# Patient Record
Sex: Female | Born: 1935 | Race: White | Hispanic: No | Marital: Married | Smoking: Former smoker
Health system: Southern US, Community
[De-identification: ages and names within clinical notes are randomized; demographics above are authoritative.]

## PROBLEM LIST (undated history)

## (undated) DIAGNOSIS — I219 Acute myocardial infarction, unspecified: Secondary | ICD-10-CM

## (undated) DIAGNOSIS — I639 Cerebral infarction, unspecified: Secondary | ICD-10-CM

## (undated) DIAGNOSIS — S2239XA Fracture of one rib, unspecified side, initial encounter for closed fracture: Secondary | ICD-10-CM

## (undated) DIAGNOSIS — I480 Paroxysmal atrial fibrillation: Secondary | ICD-10-CM

## (undated) DIAGNOSIS — S2249XA Multiple fractures of ribs, unspecified side, initial encounter for closed fracture: Secondary | ICD-10-CM

## (undated) DIAGNOSIS — E7800 Pure hypercholesterolemia, unspecified: Secondary | ICD-10-CM

## (undated) DIAGNOSIS — G629 Polyneuropathy, unspecified: Secondary | ICD-10-CM

## (undated) DIAGNOSIS — Z5189 Encounter for other specified aftercare: Secondary | ICD-10-CM

## (undated) DIAGNOSIS — R112 Nausea with vomiting, unspecified: Secondary | ICD-10-CM

## (undated) DIAGNOSIS — R51 Headache: Secondary | ICD-10-CM

## (undated) DIAGNOSIS — I509 Heart failure, unspecified: Secondary | ICD-10-CM

## (undated) DIAGNOSIS — M1990 Unspecified osteoarthritis, unspecified site: Secondary | ICD-10-CM

## (undated) DIAGNOSIS — I10 Essential (primary) hypertension: Secondary | ICD-10-CM

## (undated) DIAGNOSIS — Z9889 Other specified postprocedural states: Secondary | ICD-10-CM

## (undated) DIAGNOSIS — L500 Allergic urticaria: Secondary | ICD-10-CM

## (undated) DIAGNOSIS — H269 Unspecified cataract: Secondary | ICD-10-CM

## (undated) DIAGNOSIS — D649 Anemia, unspecified: Secondary | ICD-10-CM

## (undated) DIAGNOSIS — G459 Transient cerebral ischemic attack, unspecified: Secondary | ICD-10-CM

## (undated) DIAGNOSIS — B029 Zoster without complications: Secondary | ICD-10-CM

## (undated) HISTORY — PX: OTHER SURGICAL HISTORY: SHX169

## (undated) HISTORY — DX: Anemia, unspecified: D64.9

## (undated) HISTORY — DX: Heart failure, unspecified: I50.9

## (undated) HISTORY — DX: Transient cerebral ischemic attack, unspecified: G45.9

## (undated) HISTORY — DX: Acute myocardial infarction, unspecified: I21.9

## (undated) HISTORY — PX: EYE SURGERY: SHX253

## (undated) HISTORY — DX: Unspecified cataract: H26.9

## (undated) HISTORY — PX: DILATION AND CURETTAGE OF UTERUS: SHX78

## (undated) HISTORY — DX: Cerebral infarction, unspecified: I63.9

## (undated) HISTORY — DX: Encounter for other specified aftercare: Z51.89

## (undated) HISTORY — DX: Paroxysmal atrial fibrillation: I48.0

---
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
## 1997-12-21 ENCOUNTER — Other Ambulatory Visit: Admission: RE | Admit: 1997-12-21 | Discharge: 1997-12-21 | Payer: Self-pay | Admitting: Internal Medicine

## 1998-12-23 ENCOUNTER — Other Ambulatory Visit: Admission: RE | Admit: 1998-12-23 | Discharge: 1998-12-23 | Payer: Self-pay | Admitting: Internal Medicine

## 2000-04-30 ENCOUNTER — Encounter: Payer: Self-pay | Admitting: Emergency Medicine

## 2000-04-30 ENCOUNTER — Emergency Department (HOSPITAL_COMMUNITY): Admission: EM | Admit: 2000-04-30 | Discharge: 2000-04-30 | Payer: Self-pay | Admitting: Emergency Medicine

## 2009-02-09 HISTORY — PX: JOINT REPLACEMENT: SHX530

## 2009-07-03 ENCOUNTER — Inpatient Hospital Stay (HOSPITAL_COMMUNITY): Admission: RE | Admit: 2009-07-03 | Discharge: 2009-07-07 | Payer: Self-pay | Admitting: Orthopedic Surgery

## 2010-04-02 ENCOUNTER — Other Ambulatory Visit (HOSPITAL_COMMUNITY): Payer: Self-pay | Admitting: Orthopedic Surgery

## 2010-04-02 DIAGNOSIS — M25551 Pain in right hip: Secondary | ICD-10-CM

## 2010-04-11 ENCOUNTER — Ambulatory Visit (HOSPITAL_COMMUNITY)
Admission: RE | Admit: 2010-04-11 | Discharge: 2010-04-11 | Disposition: A | Discharge status: Home / Self Care | Payer: Medicare Other | Source: Ambulatory Visit | Attending: Orthopedic Surgery | Admitting: Orthopedic Surgery

## 2010-04-11 ENCOUNTER — Encounter (HOSPITAL_COMMUNITY)
Admission: RE | Admit: 2010-04-11 | Discharge: 2010-04-11 | Disposition: A | Discharge status: Home / Self Care | Payer: Medicare Other | Source: Ambulatory Visit | Attending: Orthopedic Surgery | Admitting: Orthopedic Surgery

## 2010-04-11 DIAGNOSIS — Z96649 Presence of unspecified artificial hip joint: Secondary | ICD-10-CM | POA: Insufficient documentation

## 2010-04-11 DIAGNOSIS — M25559 Pain in unspecified hip: Secondary | ICD-10-CM | POA: Insufficient documentation

## 2010-04-11 DIAGNOSIS — M25551 Pain in right hip: Secondary | ICD-10-CM

## 2010-04-11 MED ORDER — TECHNETIUM TC 99M MEDRONATE IV KIT
25.0000 | PACK | Freq: Once | INTRAVENOUS | Status: AC | PRN
  Administered 2010-04-11: 23.9 via INTRAVENOUS

## 2010-04-23 ENCOUNTER — Ambulatory Visit: Payer: Medicare Other | Attending: Orthopedic Surgery | Admitting: Physical Therapy

## 2010-04-23 DIAGNOSIS — M25669 Stiffness of unspecified knee, not elsewhere classified: Secondary | ICD-10-CM | POA: Insufficient documentation

## 2010-04-23 DIAGNOSIS — M25559 Pain in unspecified hip: Secondary | ICD-10-CM | POA: Insufficient documentation

## 2010-04-23 DIAGNOSIS — Z96649 Presence of unspecified artificial hip joint: Secondary | ICD-10-CM | POA: Insufficient documentation

## 2010-04-23 DIAGNOSIS — IMO0001 Reserved for inherently not codable concepts without codable children: Secondary | ICD-10-CM | POA: Insufficient documentation

## 2010-04-23 DIAGNOSIS — R262 Difficulty in walking, not elsewhere classified: Secondary | ICD-10-CM | POA: Insufficient documentation

## 2010-04-28 LAB — ABO/RH: ABO/RH(D): A POS

## 2010-04-28 LAB — PROTIME-INR
INR: 1.06 (ref 0.00–1.49)
INR: 1.07 (ref 0.00–1.49)
INR: 1.25 (ref 0.00–1.49)
INR: 1.3 (ref 0.00–1.49)
Prothrombin Time: 13.8 seconds (ref 11.6–15.2)
Prothrombin Time: 15.1 seconds (ref 11.6–15.2)
Prothrombin Time: 15.6 seconds — ABNORMAL HIGH (ref 11.6–15.2)
Prothrombin Time: 16.1 seconds — ABNORMAL HIGH (ref 11.6–15.2)

## 2010-04-28 LAB — TYPE AND SCREEN
ABO/RH(D): A POS
Antibody Screen: NEGATIVE

## 2010-04-28 LAB — BASIC METABOLIC PANEL
BUN: 11 mg/dL (ref 6–23)
BUN: 11 mg/dL (ref 6–23)
Calcium: 8.4 mg/dL (ref 8.4–10.5)
Calcium: 8.4 mg/dL (ref 8.4–10.5)
Creatinine, Ser: 0.84 mg/dL (ref 0.4–1.2)
Creatinine, Ser: 0.91 mg/dL (ref 0.4–1.2)
GFR calc Af Amer: >60 mL/min (ref >60)
GFR calc non Af Amer: 59 mL/min — ABNORMAL LOW (ref >60)
GFR calc non Af Amer: >60 mL/min (ref >60)
Glucose, Bld: 216 mg/dL — ABNORMAL HIGH (ref 70–99)
Sodium: 139 mEq/L (ref 135–145)

## 2010-04-28 LAB — CBC
Hemoglobin: 10.6 g/dL — ABNORMAL LOW (ref 12.0–15.0)
Hemoglobin: 9.2 g/dL — ABNORMAL LOW (ref 12.0–15.0)
Platelets: 136 10*3/uL — ABNORMAL LOW (ref 150–400)
Platelets: 144 10*3/uL — ABNORMAL LOW (ref 150–400)
Platelets: 179 10*3/uL (ref 150–400)
RBC: 2.78 MIL/uL — ABNORMAL LOW (ref 3.87–5.11)
RBC: 2.93 MIL/uL — ABNORMAL LOW (ref 3.87–5.11)
RDW: 13.5 % (ref 11.5–15.5)
RDW: 13.9 % (ref 11.5–15.5)
WBC: 7.5 10*3/uL (ref 4.0–10.5)

## 2010-04-28 LAB — COMPREHENSIVE METABOLIC PANEL
Albumin: 3.9 g/dL (ref 3.5–5.2)
Alkaline Phosphatase: 78 U/L (ref 39–117)
BUN: 18 mg/dL (ref 6–23)
Potassium: 4 mEq/L (ref 3.5–5.1)
Sodium: 138 mEq/L (ref 135–145)
Total Protein: 6.1 g/dL (ref 6.0–8.3)

## 2010-04-28 LAB — URINALYSIS, ROUTINE W REFLEX MICROSCOPIC
Ketones, ur: NEGATIVE mg/dL
Nitrite: NEGATIVE
Protein, ur: NEGATIVE mg/dL

## 2010-04-30 ENCOUNTER — Ambulatory Visit: Payer: Medicare Other | Admitting: Physical Therapy

## 2010-05-02 ENCOUNTER — Ambulatory Visit: Payer: Medicare Other | Admitting: Physical Therapy

## 2010-05-05 ENCOUNTER — Ambulatory Visit: Payer: Medicare Other | Admitting: Physical Therapy

## 2010-05-07 ENCOUNTER — Ambulatory Visit: Payer: Medicare Other | Admitting: Physical Therapy

## 2010-05-12 ENCOUNTER — Ambulatory Visit: Payer: Medicare Other | Attending: Orthopedic Surgery | Admitting: Physical Therapy

## 2010-05-12 DIAGNOSIS — M25669 Stiffness of unspecified knee, not elsewhere classified: Secondary | ICD-10-CM | POA: Insufficient documentation

## 2010-05-12 DIAGNOSIS — M25559 Pain in unspecified hip: Secondary | ICD-10-CM | POA: Insufficient documentation

## 2010-05-12 DIAGNOSIS — Z96649 Presence of unspecified artificial hip joint: Secondary | ICD-10-CM | POA: Insufficient documentation

## 2010-05-12 DIAGNOSIS — R262 Difficulty in walking, not elsewhere classified: Secondary | ICD-10-CM | POA: Insufficient documentation

## 2010-05-12 DIAGNOSIS — IMO0001 Reserved for inherently not codable concepts without codable children: Secondary | ICD-10-CM | POA: Insufficient documentation

## 2010-05-14 ENCOUNTER — Ambulatory Visit: Payer: Medicare Other | Admitting: Physical Therapy

## 2010-05-21 ENCOUNTER — Ambulatory Visit: Payer: Medicare Other | Admitting: Physical Therapy

## 2010-05-26 ENCOUNTER — Ambulatory Visit: Payer: Medicare Other | Admitting: Physical Therapy

## 2010-05-28 ENCOUNTER — Ambulatory Visit: Payer: Medicare Other | Admitting: Physical Therapy

## 2010-06-02 ENCOUNTER — Ambulatory Visit: Payer: Medicare Other | Admitting: Physical Therapy

## 2010-06-04 ENCOUNTER — Ambulatory Visit: Payer: Medicare Other | Admitting: Physical Therapy

## 2010-06-09 ENCOUNTER — Ambulatory Visit: Payer: Medicare Other | Admitting: Physical Therapy

## 2010-06-11 ENCOUNTER — Ambulatory Visit: Payer: Medicare Other | Attending: Orthopedic Surgery | Admitting: Physical Therapy

## 2010-06-11 DIAGNOSIS — M25669 Stiffness of unspecified knee, not elsewhere classified: Secondary | ICD-10-CM | POA: Insufficient documentation

## 2010-06-11 DIAGNOSIS — R262 Difficulty in walking, not elsewhere classified: Secondary | ICD-10-CM | POA: Insufficient documentation

## 2010-06-11 DIAGNOSIS — IMO0001 Reserved for inherently not codable concepts without codable children: Secondary | ICD-10-CM | POA: Insufficient documentation

## 2010-06-11 DIAGNOSIS — Z96649 Presence of unspecified artificial hip joint: Secondary | ICD-10-CM | POA: Insufficient documentation

## 2010-06-11 DIAGNOSIS — M25559 Pain in unspecified hip: Secondary | ICD-10-CM | POA: Insufficient documentation

## 2010-06-23 ENCOUNTER — Ambulatory Visit: Payer: Medicare Other | Admitting: Physical Therapy

## 2010-06-25 ENCOUNTER — Encounter: Payer: Medicare Other | Admitting: Physical Therapy

## 2010-06-25 ENCOUNTER — Ambulatory Visit: Payer: Medicare Other | Admitting: Physical Therapy

## 2010-06-26 ENCOUNTER — Ambulatory Visit: Payer: Medicare Other | Admitting: Physical Therapy

## 2010-06-30 ENCOUNTER — Ambulatory Visit: Payer: Medicare Other | Admitting: Physical Therapy

## 2010-07-01 ENCOUNTER — Encounter: Payer: Medicare Other | Admitting: Physical Therapy

## 2010-07-02 ENCOUNTER — Ambulatory Visit: Payer: Medicare Other | Admitting: Physical Therapy

## 2010-07-03 ENCOUNTER — Encounter: Payer: Medicare Other | Admitting: Physical Therapy

## 2010-07-04 ENCOUNTER — Encounter: Payer: Medicare Other | Admitting: Physical Therapy

## 2011-02-25 ENCOUNTER — Other Ambulatory Visit: Payer: Self-pay | Admitting: Orthopedic Surgery

## 2011-05-13 ENCOUNTER — Encounter (HOSPITAL_COMMUNITY): Payer: Self-pay | Admitting: Pharmacy Technician

## 2011-05-14 ENCOUNTER — Encounter (HOSPITAL_COMMUNITY): Payer: Self-pay

## 2011-05-14 ENCOUNTER — Ambulatory Visit (HOSPITAL_COMMUNITY)
Admission: RE | Admit: 2011-05-14 | Discharge: 2011-05-14 | Disposition: A | Discharge status: Home / Self Care | Payer: Medicare Other | Source: Ambulatory Visit | Attending: Orthopedic Surgery | Admitting: Orthopedic Surgery

## 2011-05-14 ENCOUNTER — Encounter (HOSPITAL_COMMUNITY)
Admission: RE | Admit: 2011-05-14 | Discharge: 2011-05-14 | Disposition: A | Discharge status: Home / Self Care | Payer: Medicare Other | Source: Ambulatory Visit | Attending: Orthopedic Surgery | Admitting: Orthopedic Surgery

## 2011-05-14 DIAGNOSIS — Z01818 Encounter for other preprocedural examination: Secondary | ICD-10-CM | POA: Insufficient documentation

## 2011-05-14 DIAGNOSIS — Z01812 Encounter for preprocedural laboratory examination: Secondary | ICD-10-CM | POA: Insufficient documentation

## 2011-05-14 DIAGNOSIS — I10 Essential (primary) hypertension: Secondary | ICD-10-CM | POA: Insufficient documentation

## 2011-05-14 DIAGNOSIS — M1610 Unilateral primary osteoarthritis, unspecified hip: Secondary | ICD-10-CM | POA: Insufficient documentation

## 2011-05-14 DIAGNOSIS — M169 Osteoarthritis of hip, unspecified: Secondary | ICD-10-CM | POA: Insufficient documentation

## 2011-05-14 HISTORY — DX: Unspecified osteoarthritis, unspecified site: M19.90

## 2011-05-14 HISTORY — DX: Nausea with vomiting, unspecified: R11.2

## 2011-05-14 HISTORY — DX: Polyneuropathy, unspecified: G62.9

## 2011-05-14 HISTORY — DX: Headache: R51

## 2011-05-14 HISTORY — DX: Allergic urticaria: L50.0

## 2011-05-14 HISTORY — DX: Essential (primary) hypertension: I10

## 2011-05-14 HISTORY — DX: Zoster without complications: B02.9

## 2011-05-14 HISTORY — DX: Fracture of one rib, unspecified side, initial encounter for closed fracture: S22.39XA

## 2011-05-14 HISTORY — DX: Other specified postprocedural states: Z98.890

## 2011-05-14 HISTORY — DX: Pure hypercholesterolemia, unspecified: E78.00

## 2011-05-14 HISTORY — DX: Multiple fractures of ribs, unspecified side, initial encounter for closed fracture: S22.49XA

## 2011-05-14 LAB — URINE MICROSCOPIC-ADD ON

## 2011-05-14 LAB — CBC
HCT: 39.2 % (ref 36.0–46.0)
Hemoglobin: 12.9 g/dL (ref 12.0–15.0)
MCH: 30.1 pg (ref 26.0–34.0)
MCHC: 32.9 g/dL (ref 30.0–36.0)
MCV: 91.6 fL (ref 78.0–100.0)
RBC: 4.28 MIL/uL (ref 3.87–5.11)

## 2011-05-14 LAB — COMPREHENSIVE METABOLIC PANEL
BUN: 16 mg/dL (ref 6–23)
CO2: 29 mEq/L (ref 19–32)
Calcium: 9 mg/dL (ref 8.4–10.5)
GFR calc Af Amer: 87 mL/min — ABNORMAL LOW (ref >90)
GFR calc non Af Amer: 75 mL/min — ABNORMAL LOW (ref >90)
Glucose, Bld: 102 mg/dL — ABNORMAL HIGH (ref 70–99)
Total Protein: 7 g/dL (ref 6.0–8.3)

## 2011-05-14 LAB — URINALYSIS, ROUTINE W REFLEX MICROSCOPIC
Ketones, ur: NEGATIVE mg/dL
Protein, ur: NEGATIVE mg/dL
Urobilinogen, UA: 0.2 mg/dL (ref 0.0–1.0)

## 2011-05-14 LAB — PROTIME-INR: Prothrombin Time: 13.6 seconds (ref 11.6–15.2)

## 2011-05-14 LAB — SURGICAL PCR SCREEN: MRSA, PCR: NEGATIVE

## 2011-05-14 NOTE — Patient Instructions (Signed)
YOUR SURGERY IS SCHEDULED ON:  April  17  AT 10:45 AM  REPORT TO [facility] AT:  8:15 AM      PHONE # FOR SHORT STAY IS [phone]  DO NOT EAT OR DRINK ANYTHING AFTER MIDNIGHT THE NIGHT BEFORE YOUR SURGERY.  YOU MAY BRUSH YOUR TEETH, RINSE OUT YOUR MOUTH--BUT NO WATER, NO FOOD, NO CHEWING GUM, NO MINTS, NO CANDIES, NO CHEWING TOBACCO.  PLEASE TAKE THE FOLLOWING MEDICATIONS THE AM OF YOUR SURGERY WITH A FEW SIPS OF WATER:  METOPROLOL    IF YOU USE INHALERS--USE YOUR INHALERS THE AM OF YOUR SURGERY AND BRING INHALERS TO THE HOSPITAL -TAKE TO SURGERY.    IF YOU ARE DIABETIC:  DO NOT TAKE ANY DIABETIC MEDICATIONS THE AM OF YOUR SURGERY.  IF YOU TAKE INSULIN IN THE EVENINGS--PLEASE ONLY TAKE 1/2 NORMAL EVENING DOSE THE NIGHT BEFORE YOUR SURGERY.  NO INSULIN THE AM OF YOUR SURGERY.  IF YOU HAVE SLEEP APNEA AND USE CPAP OR BIPAP--PLEASE BRING THE MASK --NOT THE MACHINE-NOT THE TUBING   -JUST THE MASK. DO NOT BRING VALUABLES, MONEY, CREDIT CARDS.  CONTACT LENS, DENTURES / PARTIALS, GLASSES SHOULD NOT BE WORN TO SURGERY AND IN MOST CASES-HEARING AIDS WILL NEED TO BE REMOVED.  BRING YOUR GLASSES CASE, ANY EQUIPMENT NEEDED FOR YOUR CONTACT LENS. FOR PATIENTS ADMITTED TO THE HOSPITAL--CHECK OUT TIME THE DAY OF DISCHARGE IS 11:00 AM.  ALL INPATIENT ROOMS ARE PRIVATE - WITH BATHROOM, TELEPHONE, TELEVISION AND WIFI INTERNET. IF YOU ARE BEING DISCHARGED THE SAME DAY OF YOUR SURGERY--YOU CAN NOT DRIVE YOURSELF HOME--AND SHOULD NOT GO HOME ALONE BY TAXI OR BUS.  NO DRIVING OR OPERATING MACHINERY FOR 24 HOURS FOLLOWING ANESTHESIA / PAIN MEDICATIONS.                            SPECIAL INSTRUCTIONS:  CHLORHEXIDINE SOAP SHOWER (other brand names are Betasept and Hibiclens ) PLEASE SHOWER WITH CHLORHEXIDINE THE NIGHT BEFORE YOUR SURGERY AND THE AM OF YOUR SURGERY. DO NOT USE CHLORHEXIDINE ON YOUR FACE OR PRIVATE AREAS--YOU MAY USE YOUR NORMAL SOAP THOSE AREAS AND YOUR NORMAL SHAMPOO.  WOMEN  SHOULD AVOID SHAVING UNDER ARMS AND SHAVING LEGS 48 HOURS BEFORE USING CHLORHEXIDINE TO AVOID SKIN IRRITATION.  DO NOT USE IF ALLERGIC TO CHLORHEXIDINE.  PLEASE READ OVER ANY  FACT SHEETS THAT YOU WERE GIVEN: MRSA INFORMATION, BLOOD TRANSFUSION INFORMATION, INCENTIVE SPIROMETER INFORMATION.

## 2011-05-14 NOTE — Pre-Procedure Instructions (Signed)
PT'S EKG REPORT 03/09/2011 ON PT'S CHART FROM DR. PANG ALONG WITH NOTE OF MEDICAL CLEARANCE AND COPY OF PT'S NUCLEAR STRESS TEST REPORT-NORMAL DONE ON 03/13/2011 BY DR. GANJI. CXR, LEFT HIP XRAY, CBC, CMET, PT, PTT, UA WERE DONE TODAY PREOP--T/S WILL BE DRAWN ON ARRIVAL FOR SURGERY.

## 2011-05-26 ENCOUNTER — Other Ambulatory Visit: Payer: Self-pay | Admitting: Orthopedic Surgery

## 2011-05-26 NOTE — H&P (Signed)
Maria [name]  DOB: 05/07/1922 Married / Language: English / Race: White Female  Date of Admission:  05/27/2011  Chief Complaint:  Left Hip Pain  History of Present Illness The patient is a 78 year old female who comes in today for a preoperative History and Physical. The patient is scheduled for a left total hip arthroplasty to be performed by Dr. Frank V. Aluisio, MD at [facility] on 05/27/2011. he patient is a 78 year old female who presents today for follow up of their hip. The patient is being followed for their left osteoarthritis. Symptoms reported today include: stiffness. The patient feels that they are doing well. The following medication has been used for pain control: none. The patient presents today following 3 weeks post intra-articular hip injection. She states that she still has some pain since the injection but the pain is much more manageable. She is hopeful that the injection will continue to give her pain relief until she has her hip replaced in April. The patient is being followed for their right knee pain. from injury. Symptoms reported today include: pain, giving way, instability and difficulty ambulating. The patient feels that they are doing well and report their pain level to be moderate to severe. The following medication has been used for pain control: none. The patient presents today following MRI. The patient has reported improvement of their symptoms with: bracing. The patient indicates that they have questions or concerns today regarding pain, their progress at this point and work duties. They have been treated conservatively in the past for the above stated problem and despite conservative measures, they continue to have progressive pain and severe functional limitations and dysfunction. They have failed non-operative management. It is felt that they would benefit from undergoing total joint replacement. Risks and benefits of the procedure have  been discussed with the patient and they elect to proceed with surgery. There are no active contraindications to surgery such as ongoing infection or rapidly progressive neurological disease.  Allergies Codeine/Codeine Derivatives. Nausea, Vomiting. Tylenol *ANALGESICS - NonNarcotic*. Itching. Advil *ANALGESICS - ANTI-INFLAMMATORY*. Itching.  Medication History(DREW L Letitia Sabala, PA-C; 05/07/2011 1:46 PM) Metoprolol Tartrate (25MG Tablet, 1/2 tab Oral daily) Active. Furosemide (20MG Tablet, Oral as needed) Active. B12-Active (1MG Tablet Chewable, Oral) Active. (Injection every 3 months.) Lotemax (0.5% Ointment, Ophthalmic) Active.  Problem List/Past Medical Fracture, ribs (807.00) Peripheral Neuropathy Osteoporosis Osteoarthritis Migraine Headache High blood pressure Allergic Urticaria Shingles Glaucoma Cataract Hypercholesterolemia Measles Mumps  Past Surgical History Total Hip Replacement. Date: 2011. right Tonsillectomy. Date: 1934. Hysterectomy. Date: 1958. complete (non-cancerous) Dilation and Curettage of Uterus - Multiple Cataract Surgery. bilateral; 1998 & 2006 Appendectomy. Date: 1937. External Fixator Left Arm Fracture. Date: 1997.  Family History Chronic Obstructive Lung Disease. mother Heart Disease. mother, sister and brother Hypertension. mother Osteoarthritis. mother Father. Deceased. age 39, Motorcycle accident Mother. Deceased. age 71  Social History Alcohol use. never consumed alcohol Children. 3 Current work status. retired Exercise. Exercises rarely; does running / walking and other Illicit drug use. no Living situation. live with spouse Marital status. married Number of flights of stairs before winded. 2-3 Pain Contract. no Tobacco / smoke exposure. no Tobacco use. former smoker; smoke(d) 1 pack(s) per day Post-Surgical Plans. Plans to go home.  Review of Systems General:Not Present- Chills, Fever, Night  Sweats, Fatigue, Weight Gain, Weight Loss and Memory Loss. Skin:Present- Itching. Not Present- Hives, Rash, Eczema and Lesions. HEENT:Present- Dentures. Not Present- Tinnitus, Headache, Double Vision, Visual Loss and Hearing Loss. Respiratory:Not   Present- Shortness of breath with exertion, Shortness of breath at rest, Allergies, Coughing up blood and Chronic Cough. Cardiovascular:Not Present- Chest Pain, Racing/skipping heartbeats, Difficulty Breathing Lying Down, Murmur, Swelling and Palpitations. Gastrointestinal:Not Present- Bloody Stool, Heartburn, Abdominal Pain, Vomiting, Nausea, Constipation, Diarrhea, Difficulty Swallowing, Jaundice and Loss of appetitie. Female Genitourinary:Present- Urinating at Night. Not Present- Blood in Urine, Urinary frequency, Weak urinary stream, Discharge, Flank Pain, Incontinence, Painful Urination, Urgency and Urinary Retention. Musculoskeletal:Not Present- Muscle Weakness, Muscle Pain, Joint Swelling, Joint Pain, Back Pain, Morning Stiffness and Spasms. Neurological:Not Present- Tremor, Dizziness, Blackout spells, Paralysis, Difficulty with balance and Weakness. Psychiatric:Not Present- Insomnia.  Vitals Weight: 170 lb Height: 64 in Body Surface Area: 1.87 m Body Mass Index: 29.18 kg/m Pulse: 64 (Regular) Resp.: 20 (Unlabored) BP: 158/68 (Sitting, Right Arm, Standard)  Physical Exam The physical exam findings are as follows:  General Mental Status - Alert, cooperative and good historian. General Appearance- pleasant. Not in acute distress. Orientation- Oriented X3. Build & Nutrition- Well nourished and Well developed.   Note: Patient is an 78 year old female with continued hip pain. Patient is accompanied today by her husband.  Head and Neck Head- normocephalic, atraumatic . Neck Global Assessment- supple. no bruit auscultated on the right and no bruit auscultated on the left.   Eye Pupil- Bilateral- Regular and  Round. Motion- Bilateral- EOMI. wears glasses  ENMT  upper and lower denture plates  Chest and Lung Exam Auscultation: Breath sounds:- clear at anterior chest wall and - clear at posterior chest wall. Adventitious sounds:- No Adventitious sounds.   Cardiovascular Auscultation:Rhythm- Regular rate and rhythm. Heart Sounds- S1 WNL and S2 WNL. Murmurs & Other Heart Sounds:Auscultation of the heart reveals - No Murmurs.   Abdomen Palpation/Percussion:Tenderness- Abdomen is non-tender to palpation. Rigidity (guarding)- Abdomen is soft. Auscultation:Auscultation of the abdomen reveals - Bowel sounds normal.   Female Genitourinary Not done, not pertinent to present illness  Musculoskeletal  On exam, well-developed female, alert and oriented, in no apparent distress. The left foot can be flexed about 90. No internal rotation. About 20 external rotation, 20 abduction.   RADIOGRAPHS: Complete bone on bone in the hip with osteophyte formation and subchondral cyst.  Assessment & Plan Osteoarthritis Left Hip  Patient is for a Left Total HIp Replacement by Dr. Aluisio.  Plan is to go home.  Patient has had nausea and vomiting with anesthesia in the past.  PCP - Dr. Richard Pang  Drew Naidelyn Parrella, PA-C  

## 2011-05-27 ENCOUNTER — Ambulatory Visit (HOSPITAL_COMMUNITY): Payer: Medicare Other

## 2011-05-27 ENCOUNTER — Encounter (HOSPITAL_COMMUNITY): Payer: Self-pay | Admitting: Anesthesiology

## 2011-05-27 ENCOUNTER — Inpatient Hospital Stay (HOSPITAL_COMMUNITY)
Admission: RE | Admit: 2011-05-27 | Discharge: 2011-05-31 | DRG: 470 | Disposition: A | Discharge status: Home Health | Payer: Medicare Other | Source: Ambulatory Visit | Attending: Orthopedic Surgery | Admitting: Orthopedic Surgery

## 2011-05-27 ENCOUNTER — Encounter (HOSPITAL_COMMUNITY): Payer: Self-pay | Admitting: *Deleted

## 2011-05-27 ENCOUNTER — Ambulatory Visit (HOSPITAL_COMMUNITY): Payer: Medicare Other | Admitting: Anesthesiology

## 2011-05-27 ENCOUNTER — Encounter (HOSPITAL_COMMUNITY)
Admission: RE | Disposition: A | Discharge status: Home Health | Payer: Self-pay | Source: Ambulatory Visit | Attending: Orthopedic Surgery

## 2011-05-27 DIAGNOSIS — Z96649 Presence of unspecified artificial hip joint: Secondary | ICD-10-CM

## 2011-05-27 DIAGNOSIS — M1610 Unilateral primary osteoarthritis, unspecified hip: Principal | ICD-10-CM | POA: Diagnosis present

## 2011-05-27 DIAGNOSIS — M169 Osteoarthritis of hip, unspecified: Secondary | ICD-10-CM | POA: Diagnosis present

## 2011-05-27 DIAGNOSIS — D62 Acute posthemorrhagic anemia: Secondary | ICD-10-CM | POA: Diagnosis not present

## 2011-05-27 DIAGNOSIS — I10 Essential (primary) hypertension: Secondary | ICD-10-CM | POA: Diagnosis present

## 2011-05-27 DIAGNOSIS — M810 Age-related osteoporosis without current pathological fracture: Secondary | ICD-10-CM | POA: Diagnosis present

## 2011-05-27 HISTORY — PX: TOTAL HIP ARTHROPLASTY: SHX124

## 2011-05-27 LAB — TYPE AND SCREEN: ABO/RH(D): A POS

## 2011-05-27 SURGERY — ARTHROPLASTY, HIP, TOTAL,POSTERIOR APPROACH
Anesthesia: General | Site: Hip | Laterality: Left | Wound class: Clean

## 2011-05-27 MED ORDER — BUPIVACAINE LIPOSOME 1.3 % IJ SUSP
20.0000 mL | Freq: Once | INTRAMUSCULAR | Status: DC
  Filled 2011-05-27: qty 20

## 2011-05-27 MED ORDER — CEFAZOLIN SODIUM 1-5 GM-% IV SOLN
INTRAVENOUS | Status: AC
  Filled 2011-05-27: qty 50

## 2011-05-27 MED ORDER — LACTATED RINGERS IV SOLN
INTRAVENOUS | Status: DC
Start: 2011-05-27 — End: 2011-05-27
  Administered 2011-05-27: 1000 mL via INTRAVENOUS

## 2011-05-27 MED ORDER — FENTANYL CITRATE 0.05 MG/ML IJ SOLN
INTRAMUSCULAR | Status: DC | PRN
  Administered 2011-05-27 (×2): 50 ug via INTRAVENOUS
  Administered 2011-05-27: 100 ug via INTRAVENOUS
  Administered 2011-05-27: 50 ug via INTRAVENOUS

## 2011-05-27 MED ORDER — POTASSIUM CHLORIDE IN NACL 20-0.9 MEQ/L-% IV SOLN
INTRAVENOUS | Status: DC
  Administered 2011-05-27 – 2011-05-28 (×2): via INTRAVENOUS
  Filled 2011-05-27 (×3): qty 1000

## 2011-05-27 MED ORDER — EPHEDRINE SULFATE 50 MG/ML IJ SOLN
INTRAMUSCULAR | Status: DC | PRN
  Administered 2011-05-27: 10 mg via INTRAVENOUS

## 2011-05-27 MED ORDER — TEMAZEPAM 15 MG PO CAPS: 15.0000 mg | ORAL_CAPSULE | Freq: Every evening | ORAL | Status: DC | PRN

## 2011-05-27 MED ORDER — HYDROMORPHONE HCL PF 1 MG/ML IJ SOLN
INTRAMUSCULAR | Status: AC
  Filled 2011-05-27: qty 1

## 2011-05-27 MED ORDER — 0.9 % SODIUM CHLORIDE (POUR BTL) OPTIME
TOPICAL | Status: DC | PRN
  Administered 2011-05-27: 1000 mL

## 2011-05-27 MED ORDER — PROPOFOL 10 MG/ML IV BOLUS
INTRAVENOUS | Status: DC | PRN
  Administered 2011-05-27: 150 mg via INTRAVENOUS

## 2011-05-27 MED ORDER — LIDOCAINE HCL (CARDIAC) 20 MG/ML IV SOLN
INTRAVENOUS | Status: DC | PRN
  Administered 2011-05-27: 50 mg via INTRAVENOUS

## 2011-05-27 MED ORDER — HYDROMORPHONE HCL 2 MG PO TABS
2.0000 mg | ORAL_TABLET | ORAL | Status: DC | PRN
  Administered 2011-05-27 – 2011-05-28 (×2): 2 mg via ORAL
  Administered 2011-05-28: 4 mg via ORAL
  Administered 2011-05-28: 2 mg via ORAL
  Administered 2011-05-28: 4 mg via ORAL
  Administered 2011-05-29 (×2): 2 mg via ORAL
  Filled 2011-05-27 (×2): qty 1
  Filled 2011-05-27: qty 2
  Filled 2011-05-27 (×2): qty 1
  Filled 2011-05-27: qty 2
  Filled 2011-05-27: qty 1

## 2011-05-27 MED ORDER — MENTHOL 3 MG MT LOZG: 1.0000 | LOZENGE | OROMUCOSAL | Status: DC | PRN

## 2011-05-27 MED ORDER — RIVAROXABAN 10 MG PO TABS
10.0000 mg | ORAL_TABLET | Freq: Every day | ORAL | Status: DC
  Administered 2011-05-28 – 2011-05-31 (×4): 10 mg via ORAL
  Filled 2011-05-27 (×4): qty 1

## 2011-05-27 MED ORDER — FLEET ENEMA 7-19 GM/118ML RE ENEM: 1.0000 | ENEMA | Freq: Once | RECTAL | Status: AC | PRN

## 2011-05-27 MED ORDER — PHENOL 1.4 % MT LIQD: 1.0000 | OROMUCOSAL | Status: DC | PRN

## 2011-05-27 MED ORDER — METHOCARBAMOL 500 MG PO TABS
500.0000 mg | ORAL_TABLET | Freq: Four times a day (QID) | ORAL | Status: DC | PRN
  Administered 2011-05-27 – 2011-05-30 (×6): 500 mg via ORAL
  Filled 2011-05-27 (×6): qty 1

## 2011-05-27 MED ORDER — DEXAMETHASONE SODIUM PHOSPHATE 10 MG/ML IJ SOLN
INTRAMUSCULAR | Status: DC | PRN
  Administered 2011-05-27: 10 mg via INTRAVENOUS

## 2011-05-27 MED ORDER — FUROSEMIDE 20 MG PO TABS
20.0000 mg | ORAL_TABLET | Freq: Every day | ORAL | Status: DC | PRN
  Filled 2011-05-27: qty 1

## 2011-05-27 MED ORDER — LACTATED RINGERS IV SOLN
INTRAVENOUS | Status: DC | PRN
  Administered 2011-05-27: 11:00:00 via INTRAVENOUS

## 2011-05-27 MED ORDER — HYDROMORPHONE HCL PF 1 MG/ML IJ SOLN
0.2500 mg | INTRAMUSCULAR | Status: DC | PRN
  Administered 2011-05-27 (×4): 0.5 mg via INTRAVENOUS

## 2011-05-27 MED ORDER — CHLORHEXIDINE GLUCONATE 4 % EX LIQD
60.0000 mL | Freq: Once | CUTANEOUS | Status: DC
  Filled 2011-05-27: qty 60

## 2011-05-27 MED ORDER — SODIUM CHLORIDE 0.9 % IV SOLN: INTRAVENOUS | Status: DC

## 2011-05-27 MED ORDER — ACETAMINOPHEN 10 MG/ML IV SOLN: 1000.0000 mg | Freq: Once | INTRAVENOUS | Status: DC

## 2011-05-27 MED ORDER — TRAMADOL HCL 50 MG PO TABS
50.0000 mg | ORAL_TABLET | Freq: Four times a day (QID) | ORAL | Status: DC | PRN
  Administered 2011-05-30 (×2): 50 mg via ORAL
  Administered 2011-05-31: 100 mg via ORAL
  Filled 2011-05-27 (×2): qty 1
  Filled 2011-05-27: qty 2

## 2011-05-27 MED ORDER — METOCLOPRAMIDE HCL 5 MG/ML IJ SOLN
5.0000 mg | Freq: Three times a day (TID) | INTRAMUSCULAR | Status: DC | PRN
  Administered 2011-05-29: 10 mg via INTRAVENOUS
  Filled 2011-05-27: qty 2

## 2011-05-27 MED ORDER — MORPHINE SULFATE 2 MG/ML IJ SOLN
1.0000 mg | INTRAMUSCULAR | Status: DC | PRN
  Administered 2011-05-27: 1 mg via INTRAVENOUS
  Filled 2011-05-27: qty 1

## 2011-05-27 MED ORDER — ONDANSETRON HCL 4 MG/2ML IJ SOLN
4.0000 mg | Freq: Four times a day (QID) | INTRAMUSCULAR | Status: DC | PRN
  Administered 2011-05-29: 4 mg via INTRAVENOUS
  Filled 2011-05-27: qty 2

## 2011-05-27 MED ORDER — METOPROLOL SUCCINATE 12.5 MG HALF TABLET
12.5000 mg | ORAL_TABLET | Freq: Every day | ORAL | Status: DC
  Administered 2011-05-28 – 2011-05-29 (×2): 12.5 mg via ORAL
  Filled 2011-05-27 (×3): qty 1

## 2011-05-27 MED ORDER — DIPHENHYDRAMINE HCL 12.5 MG/5ML PO ELIX: 12.5000 mg | ORAL_SOLUTION | ORAL | Status: DC | PRN

## 2011-05-27 MED ORDER — METOCLOPRAMIDE HCL 10 MG PO TABS: 5.0000 mg | ORAL_TABLET | Freq: Three times a day (TID) | ORAL | Status: DC | PRN

## 2011-05-27 MED ORDER — DEXAMETHASONE SODIUM PHOSPHATE 10 MG/ML IJ SOLN: 10.0000 mg | Freq: Once | INTRAMUSCULAR | Status: DC

## 2011-05-27 MED ORDER — SODIUM CHLORIDE 0.9 % IJ SOLN
INTRAMUSCULAR | Status: DC | PRN
  Administered 2011-05-27: 50 mL

## 2011-05-27 MED ORDER — LOTEPREDNOL ETABONATE 0.5 % OP SUSP
1.0000 [drp] | Freq: Every day | OPHTHALMIC | Status: DC
  Administered 2011-05-28 – 2011-05-31 (×4): 1 [drp] via OPHTHALMIC
  Filled 2011-05-27: qty 5

## 2011-05-27 MED ORDER — BISACODYL 10 MG RE SUPP
10.0000 mg | Freq: Every day | RECTAL | Status: DC | PRN
  Administered 2011-05-30: 10 mg via RECTAL

## 2011-05-27 MED ORDER — CEFAZOLIN SODIUM 1-5 GM-% IV SOLN
1.0000 g | Freq: Four times a day (QID) | INTRAVENOUS | Status: AC
  Administered 2011-05-27 – 2011-05-28 (×3): 1 g via INTRAVENOUS
  Filled 2011-05-27 (×3): qty 50

## 2011-05-27 MED ORDER — METHOCARBAMOL 100 MG/ML IJ SOLN
500.0000 mg | Freq: Four times a day (QID) | INTRAVENOUS | Status: DC | PRN
  Administered 2011-05-27: 500 mg via INTRAVENOUS
  Filled 2011-05-27: qty 5

## 2011-05-27 MED ORDER — SUCCINYLCHOLINE CHLORIDE 20 MG/ML IJ SOLN
INTRAMUSCULAR | Status: DC | PRN
  Administered 2011-05-27: 100 mg via INTRAVENOUS

## 2011-05-27 MED ORDER — POTASSIUM CHLORIDE IN NACL 20-0.9 MEQ/L-% IV SOLN
INTRAVENOUS | Status: AC
  Filled 2011-05-27: qty 1000

## 2011-05-27 MED ORDER — POLYETHYLENE GLYCOL 3350 17 G PO PACK
17.0000 g | PACK | Freq: Every day | ORAL | Status: DC | PRN
  Filled 2011-05-27: qty 1

## 2011-05-27 MED ORDER — CEFAZOLIN SODIUM 1-5 GM-% IV SOLN
1.0000 g | INTRAVENOUS | Status: AC
  Administered 2011-05-27: 1 g via INTRAVENOUS

## 2011-05-27 MED ORDER — DOCUSATE SODIUM 100 MG PO CAPS
100.0000 mg | ORAL_CAPSULE | Freq: Two times a day (BID) | ORAL | Status: DC
  Administered 2011-05-27 – 2011-05-30 (×6): 100 mg via ORAL
  Filled 2011-05-27 (×9): qty 1

## 2011-05-27 MED ORDER — BUPIVACAINE LIPOSOME 1.3 % IJ SUSP
INTRAMUSCULAR | Status: DC | PRN
  Administered 2011-05-27: 20 mL

## 2011-05-27 MED ORDER — ONDANSETRON HCL 4 MG/2ML IJ SOLN
INTRAMUSCULAR | Status: DC | PRN
  Administered 2011-05-27: 4 mg via INTRAVENOUS

## 2011-05-27 MED ORDER — ONDANSETRON HCL 4 MG PO TABS: 4.0000 mg | ORAL_TABLET | Freq: Four times a day (QID) | ORAL | Status: DC | PRN

## 2011-05-27 SURGICAL SUPPLY — 50 items
BAG ZIPLOCK 12X15 (MISCELLANEOUS) ×2 IMPLANT
BIT DRILL 2.8X128 (BIT) ×2 IMPLANT
BLADE EXTENDED COATED 6.5IN (ELECTRODE) ×2 IMPLANT
BLADE SAW SAG 73X25 THK (BLADE) ×1
BLADE SAW SGTL 73X25 THK (BLADE) ×1 IMPLANT
CLOTH BEACON ORANGE TIMEOUT ST (SAFETY) ×2 IMPLANT
DECANTER SPIKE VIAL GLASS SM (MISCELLANEOUS) ×2 IMPLANT
DRAPE INCISE IOBAN 66X45 STRL (DRAPES) ×2 IMPLANT
DRAPE ORTHO SPLIT 77X108 STRL (DRAPES) ×2
DRAPE POUCH INSTRU U-SHP 10X18 (DRAPES) ×2 IMPLANT
DRAPE SURG ORHT 6 SPLT 77X108 (DRAPES) ×2 IMPLANT
DRAPE U-SHAPE 47X51 STRL (DRAPES) ×2 IMPLANT
DRSG ADAPTIC 3X8 NADH LF (GAUZE/BANDAGES/DRESSINGS) ×2 IMPLANT
DRSG MEPILEX BORDER 4X4 (GAUZE/BANDAGES/DRESSINGS) ×2 IMPLANT
DRSG MEPILEX BORDER 4X8 (GAUZE/BANDAGES/DRESSINGS) ×2 IMPLANT
DURAPREP 26ML APPLICATOR (WOUND CARE) ×2 IMPLANT
ELECT REM PT RETURN 9FT ADLT (ELECTROSURGICAL) ×2
ELECTRODE REM PT RTRN 9FT ADLT (ELECTROSURGICAL) ×1 IMPLANT
EVACUATOR 1/8 PVC DRAIN (DRAIN) ×2 IMPLANT
FACESHIELD LNG OPTICON STERILE (SAFETY) ×8 IMPLANT
GLOVE BIO SURGEON STRL SZ7.5 (GLOVE) ×2 IMPLANT
GLOVE BIO SURGEON STRL SZ8 (GLOVE) ×2 IMPLANT
GLOVE BIOGEL PI IND STRL 8 (GLOVE) ×2 IMPLANT
GLOVE BIOGEL PI INDICATOR 8 (GLOVE) ×2
GOWN STRL NON-REIN LRG LVL3 (GOWN DISPOSABLE) ×2 IMPLANT
GOWN STRL REIN XL XLG (GOWN DISPOSABLE) ×2 IMPLANT
IMMOBILIZER KNEE 20 (SOFTGOODS) ×2
IMMOBILIZER KNEE 20 THIGH 36 (SOFTGOODS) ×1 IMPLANT
KIT BASIN OR (CUSTOM PROCEDURE TRAY) ×2 IMPLANT
MANIFOLD NEPTUNE II (INSTRUMENTS) ×2 IMPLANT
NDL SAFETY ECLIPSE 18X1.5 (NEEDLE) ×1 IMPLANT
NEEDLE HYPO 18GX1.5 SHARP (NEEDLE) ×1
NS IRRIG 1000ML POUR BTL (IV SOLUTION) ×2 IMPLANT
PACK TOTAL JOINT (CUSTOM PROCEDURE TRAY) ×2 IMPLANT
PASSER SUT SWANSON 36MM LOOP (INSTRUMENTS) ×2 IMPLANT
POSITIONER SURGICAL ARM (MISCELLANEOUS) ×2 IMPLANT
SPONGE GAUZE 4X4 12PLY (GAUZE/BANDAGES/DRESSINGS) ×2 IMPLANT
STRIP CLOSURE SKIN 1/2X4 (GAUZE/BANDAGES/DRESSINGS) ×4 IMPLANT
SUT ETHIBOND NAB CT1 #1 30IN (SUTURE) ×4 IMPLANT
SUT MNCRL AB 4-0 PS2 18 (SUTURE) ×2 IMPLANT
SUT VIC AB 1 CT1 27 (SUTURE) ×2
SUT VIC AB 1 CT1 27XBRD ANTBC (SUTURE) ×2 IMPLANT
SUT VIC AB 2-0 CT1 27 (SUTURE) ×3
SUT VIC AB 2-0 CT1 TAPERPNT 27 (SUTURE) ×3 IMPLANT
SUT VLOC 180 0 24IN GS25 (SUTURE) ×4 IMPLANT
SYR 50ML LL SCALE MARK (SYRINGE) ×2 IMPLANT
TOWEL OR 17X26 10 PK STRL BLUE (TOWEL DISPOSABLE) ×4 IMPLANT
TOWEL OR NON WOVEN STRL DISP B (DISPOSABLE) ×2 IMPLANT
TRAY FOLEY CATH 14FRSI W/METER (CATHETERS) ×2 IMPLANT
WATER STERILE IRR 1500ML POUR (IV SOLUTION) ×2 IMPLANT

## 2011-05-27 NOTE — Addendum Note (Signed)
Addendum  created 05/27/11 1347 by Azell Der, MD   Modules edited:Orders

## 2011-05-27 NOTE — Plan of Care (Signed)
Problem: Consults Goal: Diagnosis- Total Joint Replacement Left total hip     

## 2011-05-27 NOTE — H&P (View-Only) (Signed)
Maria [name]  DOB: August 08, 1922 Married / Language: English / Race: White Female  Date of Admission:  05/27/2011  Chief Complaint:  Left Hip Pain  History of Present Illness The patient is a 78 year old female who comes in today for a preoperative History and Physical. The patient is scheduled for a left total hip arthroplasty to be performed by Dr. Gus Rankin. Aluisio, MD at [facility] on 05/27/2011. he patient is a 78 year old female who presents today for follow up of their hip. The patient is being followed for their left osteoarthritis. Symptoms reported today include: stiffness. The patient feels that they are doing well. The following medication has been used for pain control: none. The patient presents today following 3 weeks post intra-articular hip injection. She states that she still has some pain since the injection but the pain is much more manageable. She is hopeful that the injection will continue to give her pain relief until she has her hip replaced in April. The patient is being followed for their right knee pain. from injury. Symptoms reported today include: pain, giving way, instability and difficulty ambulating. The patient feels that they are doing well and report their pain level to be moderate to severe. The following medication has been used for pain control: none. The patient presents today following MRI. The patient has reported improvement of their symptoms with: bracing. The patient indicates that they have questions or concerns today regarding pain, their progress at this point and work duties. They have been treated conservatively in the past for the above stated problem and despite conservative measures, they continue to have progressive pain and severe functional limitations and dysfunction. They have failed non-operative management. It is felt that they would benefit from undergoing total joint replacement. Risks and benefits of the procedure have  been discussed with the patient and they elect to proceed with surgery. There are no active contraindications to surgery such as ongoing infection or rapidly progressive neurological disease.  Allergies Codeine/Codeine Derivatives. Nausea, Vomiting. Tylenol *ANALGESICS - NonNarcotic*. Itching. Advil *ANALGESICS - ANTI-INFLAMMATORY*. Itching.  Medication History(DREW L Abbigayle Toole, PA-C; 05/07/2011 1:46 PM) Metoprolol Tartrate (25MG  Tablet, 1/2 tab Oral daily) Active. Furosemide (20MG  Tablet, Oral as needed) Active. B12-Active (1MG  Tablet Chewable, Oral) Active. (Injection every 3 months.) Lotemax (0.5% Ointment, Ophthalmic) Active.  Problem List/Past Medical Fracture, ribs (807.00) Peripheral Neuropathy Osteoporosis Osteoarthritis Migraine Headache High blood pressure Allergic Urticaria Shingles Glaucoma Cataract Hypercholesterolemia Measles Mumps  Past Surgical History Total Hip Replacement. Date: 2011. right Tonsillectomy. Date: 42. Hysterectomy. Date: 95. complete (non-cancerous) Dilation and Curettage of Uterus - Multiple Cataract Surgery. bilateral; 1998 & 2006 Appendectomy. Date: 55. External Fixator Left Arm Fracture. Date: 60.  Family History Chronic Obstructive Lung Disease. mother Heart Disease. mother, sister and brother Hypertension. mother Osteoarthritis. mother Father. Deceased. age 56, Motorcycle accident Mother. Deceased. age 52  Social History Alcohol use. never consumed alcohol Children. 3 Current work status. retired Exercise. Exercises rarely; does running / walking and other Illicit drug use. no Living situation. live with spouse Marital status. married Number of flights of stairs before winded. 2-3 Pain Contract. no Tobacco / smoke exposure. no Tobacco use. former smoker; smoke(d) 1 pack(s) per day Post-Surgical Plans. Plans to go home.  Review of Systems General:Not Present- Chills, Fever, Night  Sweats, Fatigue, Weight Gain, Weight Loss and Memory Loss. Skin:Present- Itching. Not Present- Hives, Rash, Eczema and Lesions. HEENT:Present- Dentures. Not Present- Tinnitus, Headache, Double Vision, Visual Loss and Hearing Loss. Respiratory:Not  Present- Shortness of breath with exertion, Shortness of breath at rest, Allergies, Coughing up blood and Chronic Cough. Cardiovascular:Not Present- Chest Pain, Racing/skipping heartbeats, Difficulty Breathing Lying Down, Murmur, Swelling and Palpitations. Gastrointestinal:Not Present- Bloody Stool, Heartburn, Abdominal Pain, Vomiting, Nausea, Constipation, Diarrhea, Difficulty Swallowing, Jaundice and Loss of appetitie. Female Genitourinary:Present- Urinating at Night. Not Present- Blood in Urine, Urinary frequency, Weak urinary stream, Discharge, Flank Pain, Incontinence, Painful Urination, Urgency and Urinary Retention. Musculoskeletal:Not Present- Muscle Weakness, Muscle Pain, Joint Swelling, Joint Pain, Back Pain, Morning Stiffness and Spasms. Neurological:Not Present- Tremor, Dizziness, Blackout spells, Paralysis, Difficulty with balance and Weakness. Psychiatric:Not Present- Insomnia.  Vitals Weight: 170 lb Height: 64 in Body Surface Area: 1.87 m Body Mass Index: 29.18 kg/m Pulse: 64 (Regular) Resp.: 20 (Unlabored) BP: 158/68 (Sitting, Right Arm, Standard)  Physical Exam The physical exam findings are as follows:  General Mental Status - Alert, cooperative and good historian. General Appearance- pleasant. Not in acute distress. Orientation- Oriented X3. Build & Nutrition- Well nourished and Well developed.   Note: Patient is an 78 year old female with continued hip pain. Patient is accompanied today by her husband.  Head and Neck Head- normocephalic, atraumatic . Neck Global Assessment- supple. no bruit auscultated on the right and no bruit auscultated on the left.   Eye Pupil- Bilateral- Regular and  Round. Motion- Bilateral- EOMI. wears glasses  ENMT  upper and lower denture plates  Chest and Lung Exam Auscultation: Breath sounds:- clear at anterior chest wall and - clear at posterior chest wall. Adventitious sounds:- No Adventitious sounds.   Cardiovascular Auscultation:Rhythm- Regular rate and rhythm. Heart Sounds- S1 WNL and S2 WNL. Murmurs & Other Heart Sounds:Auscultation of the heart reveals - No Murmurs.   Abdomen Palpation/Percussion:Tenderness- Abdomen is non-tender to palpation. Rigidity (guarding)- Abdomen is soft. Auscultation:Auscultation of the abdomen reveals - Bowel sounds normal.   Female Genitourinary Not done, not pertinent to present illness  Musculoskeletal  On exam, well-developed female, alert and oriented, in no apparent distress. The left foot can be flexed about 90. No internal rotation. About 20 external rotation, 20 abduction.   RADIOGRAPHS: Complete bone on bone in the hip with osteophyte formation and subchondral cyst.  Assessment & Plan Osteoarthritis Left Hip  Patient is for a Left Total HIp Replacement by Dr. Lequita Halt.  Plan is to go home.  Patient has had nausea and vomiting with anesthesia in the past.  PCP - Dr. Franky Macho, PA-C

## 2011-05-27 NOTE — Transfer of Care (Signed)
Immediate Anesthesia Transfer of Care Note  Patient: Maria [name]  Procedure(s) Performed: Procedure(s) (LRB): TOTAL HIP ARTHROPLASTY (Left)  Patient Location: PACU  Anesthesia Type: General  Level of Consciousness: sedated, patient cooperative and responds to stimulaton  Airway & Oxygen Therapy: Patient Spontanous Breathing and Patient connected to face mask oxgen  Post-op Assessment: Report given to PACU RN and Post -op Vital signs reviewed and stable  Post vital signs: Reviewed and stable  Complications: No apparent anesthesia complications

## 2011-05-27 NOTE — Interval H&P Note (Signed)
History and Physical Interval Note:  05/27/2011 10:44 AM  Maria [name]  has presented today for surgery, with the diagnosis of osteoarthritis  left hip  The various methods of treatment have been discussed with the patient and family. After consideration of risks, benefits and other options for treatment, the patient has consented to  Procedure(s) (LRB): TOTAL HIP ARTHROPLASTY (Left) as a surgical intervention .  The patients' history has been reviewed, patient examined, no change in status, stable for surgery.  I have reviewed the patients' chart and labs.  Questions were answered to the patient's satisfaction.     Loanne Drilling

## 2011-05-27 NOTE — Anesthesia Preprocedure Evaluation (Addendum)
Anesthesia Evaluation  Patient identified by MRN, date of birth, ID band Patient awake    Reviewed: Allergy & Precautions, H&P , NPO status , Patient's Chart, lab work & pertinent test results  History of Anesthesia Complications (+) PONV  Airway Mallampati: II TM Distance: >3 FB Neck ROM: Full    Dental No notable dental hx.    Pulmonary neg pulmonary ROS,  breath sounds clear to auscultation  Pulmonary exam normal       Cardiovascular hypertension, Rhythm:Regular Rate:Normal     Neuro/Psych  Headaches, glaucoma  Neuromuscular disease negative psych ROS   GI/Hepatic negative GI ROS, Neg liver ROS,   Endo/Other  negative endocrine ROS  Renal/GU negative Renal ROS  negative genitourinary   Musculoskeletal negative musculoskeletal ROS (+)   Abdominal   Peds negative pediatric ROS (+)  Hematology negative hematology ROS (+)   Anesthesia Other Findings   Reproductive/Obstetrics negative OB ROS                          Anesthesia Physical Anesthesia Plan  ASA: III  Anesthesia Plan: General   Post-op Pain Management:    Induction: Intravenous  Airway Management Planned: Oral ETT  Additional Equipment:   Intra-op Plan:   Post-operative Plan: Extubation in OR  Informed Consent: I have reviewed the patients History and Physical, chart, labs and discussed the procedure including the risks, benefits and alternatives for the proposed anesthesia with the patient or authorized representative who has indicated his/her understanding and acceptance.   Dental advisory given  Plan Discussed with: CRNA  Anesthesia Plan Comments:         Anesthesia Quick Evaluation

## 2011-05-27 NOTE — Consult Note (Signed)
  Reason for consult: Difficult urethral catheterization.  Physician requesting consult: Dr. Ollen Gross  History: 78 year old female who is undergoing a L THA by Dr. Lequita Halt today was noted to be difficult placing a urethral catheter preoperatively despite multiple attempts.  She has no known history of surgery on her urethra, bladder, or vagina.  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  Her urethral meatus is palpable under the stenotic scarred upper vagina.  Procedure: She was prepped in the usual sterile fashion.  A 14 Fr catheter was placed into the urethra under digital guidance.  Clear urine returned.  Impression: Difficult urethral catheterization  Recommendation: May remove catheter whenever appropriate during postoperative course from an orthopedic standpoint.

## 2011-05-27 NOTE — Progress Notes (Signed)
Utilization review completed.  

## 2011-05-27 NOTE — Anesthesia Postprocedure Evaluation (Signed)
  Anesthesia Post-op Note  Patient: Maria [name]  Procedure(s) Performed: Procedure(s) (LRB): TOTAL HIP ARTHROPLASTY (Left)  Patient Location: PACU  Anesthesia Type: General  Level of Consciousness: awake and alert   Airway and Oxygen Therapy: Patient Spontanous Breathing  Post-op Pain: mild  Post-op Assessment: Post-op Vital signs reviewed, Patient's Cardiovascular Status Stable, Respiratory Function Stable, Patent Airway and No signs of Nausea or vomiting  Post-op Vital Signs: stable  Complications: No apparent anesthesia complications

## 2011-05-27 NOTE — Op Note (Signed)
Pre-operative diagnosis- Osteoarthritis Left hip  Post-operative diagnosis- Osteoarthritis  Left hip  Procedure-  LeftTotal Hip Arthroplasty  Surgeon- Gus Rankin. Lovie Zarling, MD  Assistant- Avel Peace, PA-C   Anesthesia  General  EBL- 200   Drain Hemovac   Complication- None  Condition-PACU - hemodynamically stable.   Brief Clinical Note-  Maria [name] is a 78 y.o. female with end stage arthritis of her left hip with progressively worsening pain and dysfunction. Pain occurs with activity and rest including pain at night. She has tried analgesics, protected weight bearing and rest without benefit. Pain is too severe to attempt physical therapy. Radiographs demonstrate bone on bone arthritis with subchondral cyst formation. She presents now for left THA.  Procedure in detail-   The patient is brought into the operating room and placed on the operating table. After successful administration of General  anesthesia, the patient is placed in the  Right lateral decubitus position with the  Left side up and held in place with the hip positioner. The lower extremity is isolated from the perineum with plastic drapes and time-out is performed by the surgical team. The lower extremity is then prepped and draped in the usual sterile fashion. A short posterolateral incision is made with a ten blade through the subcutaneous tissue to the level of the fascia lata which is incised in line with the skin incision. The sciatic nerve is palpated and protected and the short external rotators and capsule are isolated from the femur. The hip is then dislocated and the center of the femoral head is marked. A trial prosthesis is placed such that the trial head corresponds to the center of the patients' native femoral head. The resection level is marked on the femoral neck and the resection is made with an oscillating saw. The femoral head is removed and femoral retractors placed to gain access to the femoral  canal.      The canal finder is passed into the femoral canal and the canal is thoroughly irrigated with sterile saline to remove the fatty contents. Axial reaming is performed to 15.5  mm, proximal reaming to 61F  and the sleeve machined to a small. A 61F small trial sleeve is placed into the proximal femur.      The femur is then retracted anteriorly to gain acetabular exposure. Acetabular retractors are placed and the labrum and osteophytes are removed, Acetabular reaming is performed to 53  mm and a 54  mm Pinnacle acetabular shell is placed in anatomic position with excellent purchase. Additional dome screws were not needed. An apex hole eliminator is placed and the permanent 36 mm Neutral + 4 Marathon liner is placed into the acetabular shell.      The trial femur is then placed into the femoral canal. The size is 20 x 15  stem with a 36 + 8  neck and a 36 + 3 head with the neck version 10 degrees beyond  the patients' native anteversion. The hip is reduced with excellent stability with full extension and full external rotation, 70 degrees flexion with 40 degrees adduction and 90 degrees internal rotation and 90 degrees of flexion with 70 degrees of internal rotation. The operative leg is placed on top of the non-operative leg and the leg lengths are found to be equal. The trials are then removed and the permanent implant of the same size is impacted into the femoral canal. The metal femoral head of the same size as the trial is placed and  the hip is reduced with the same stability parameters. The operative leg is again placed on top of the non-operative leg and the leg lengths are found to be equal.      The wound is then copiously irrigated with saline solution and the capsule and short external rotators are re-attached to the femur through drill holes with Ethibond suture. The fascia lata is closed over a hemovac drain with #1 vicryl suture and the fascia lata, gluteal muscles and subcutaneous tissues  are injected with Exparel 20ml diluted with saline 50ml. The subcutaneous tissues are closed with #1 and2-0 vicryl and the subcuticular layer closed with running 4-0 Monocryl. The drain is hooked to suction, incision cleaned and dried, and steri-srips and a bulky sterile dressing applied. The limb is placed into a knee immobilizer and the patient is awakened and transported to recovery in stable condition.      Please note that a surgical assistant was a medical necessity for this procedure in order to perform it in a safe and expeditious manner. The assistant was necessary to provide retraction to the vital neurovascular structures and to retract and position the limb to allow for anatomic placement of the prosthetic components.  Gus Rankin Anora Schwenke, MD    05/27/2011, 11:56 AM

## 2011-05-28 LAB — BASIC METABOLIC PANEL
Calcium: 8.4 mg/dL (ref 8.4–10.5)
GFR calc Af Amer: 87 mL/min — ABNORMAL LOW (ref >90)
GFR calc non Af Amer: 75 mL/min — ABNORMAL LOW (ref >90)
Glucose, Bld: 156 mg/dL — ABNORMAL HIGH (ref 70–99)
Potassium: 4.5 mEq/L (ref 3.5–5.1)
Sodium: 136 mEq/L (ref 135–145)

## 2011-05-28 LAB — CBC
Hemoglobin: 9.9 g/dL — ABNORMAL LOW (ref 12.0–15.0)
MCH: 29.5 pg (ref 26.0–34.0)
MCHC: 32.2 g/dL (ref 30.0–36.0)
Platelets: 199 10*3/uL (ref 150–400)

## 2011-05-28 NOTE — Evaluation (Signed)
Physical Therapy Evaluation Patient Details Name: Maria [name] MRN: [id number] DOB: 05/14/1922 Today's Date: 05/28/2011  Problem List:  Patient Active Problem List  Diagnoses  . Osteoarthritis of hip    Past Medical History:  Past Medical History  Diagnosis Date  . Peripheral neuropathy   . PONV (postoperative nausea and vomiting)   . Rib fractures     IN THE PAST  . Osteoporosis   . Arthritis     OA AND PAIN LEFT HIP  . Headache     HX OF MIGRAINES  . Hypertension   . Allergic urticaria   . Shingles 1940'S  . Glaucoma   . Hypercholesterolemia    Past Surgical History:  Past Surgical History  Procedure Date  . Joint replacement 2011    RIGHT TOTAL HIP REPLACEMENT  . Tonsillectomy 1934  . Abdominal hysterectomy 1958  . Dilation and curettage of uterus     MULTIPLE  . Eye surgery 1998 & 2006    BILATERAL CATARACT EXTRACTION  . Appendectomy 1937  . External fixator left arm fracture 1997     PT Assessment/Plan/Recommendation PT Assessment Clinical Impression Statement: pt is s/p LTHA , pt will benefit from PT to improve in functional m,obility, ROM, strength to DC to home w/ 24/7 PT Recommendation/Assessment: Patient will need skilled PT in the acute care venue PT Problem List: Decreased strength;Decreased range of motion;Decreased activity tolerance;Pain;Decreased knowledge of precautions;Decreased knowledge of use of DME PT Therapy Diagnosis : Difficulty walking;Acute pain PT Plan PT Frequency: 7X/week PT Treatment/Interventions: DME instruction;Gait training;Stair training;Functional mobility training;Therapeutic activities;Patient/family education PT Recommendation Recommendations for Other Services: OT consult Follow Up Recommendations: Home health PT Equipment Recommended: None recommended by PT PT Goals  Acute Rehab PT Goals PT Goal Formulation: With patient Time For Goal Achievement: 7 days Pt will go Supine/Side to Sit: with supervision PT  Goal: Supine/Side to Sit - Progress: Goal set today Pt will go Sit to Supine/Side: with supervision PT Goal: Sit to Supine/Side - Progress: Goal set today Pt will go Sit to Stand: with supervision PT Goal: Sit to Stand - Progress: Goal set today Pt will go Stand to Sit: with supervision PT Goal: Stand to Sit - Progress: Goal set today Pt will Ambulate: 51 - 150 feet;with rolling walker;with supervision PT Goal: Ambulate - Progress: Goal set today Pt will Go Up / Down Stairs: 3-5 stairs;with rail(s);with least restrictive assistive device;with min assist PT Goal: Up/Down Stairs - Progress: Goal set today Pt will Perform Home Exercise Program: with supervision, verbal cues required/provided PT Goal: Perform Home Exercise Program - Progress: Goal set today Additional Goals Additional Goal #1: demo/state 3/3 posterior hip precautions PT Goal: Additional Goal #1 - Progress: Goal set today  PT Evaluation Precautions/Restrictions  Precautions Precautions: Posterior Hip Precaution Comments: reviewed posterior hip precautions Restrictions Weight Bearing Restrictions: Yes LLE Weight Bearing: Partial weight bearing LLE Partial Weight Bearing Percentage or Pounds: 25-50 Prior Functioning  Home Living Lives With: Spouse Available Help at Discharge: Available 24 hours/day Type of Home: House Home Access: Stairs to enter Secretary/administrator of Steps: 4 Entrance Stairs-Rails: Can reach both;Left;Right Home Layout: One level Home Adaptive Equipment: Bedside commode/3-in-1;Walker - rolling;Straight cane Prior Function Level of Independence: Independent Able to Take Stairs?: Yes Cognition Cognition Overall Cognitive Status: Appears within functional limits for tasks assessed/performed Arousal/Alertness: Awake/alert Orientation Level: Appears intact for tasks assessed Behavior During Session: [facility] for tasks performed Sensation/Coordination Sensation Light Touch: Appears Intact Extremity  Assessment RLE Assessment RLE Assessment: Within  Functional Limits LLE Assessment LLE Assessment: Exceptions to WFL LLE AROM (degrees) LLE Overall AROM Comments: tolerated hip flex to 80 sitting on EOB LLE Strength LLE Overall Strength Comments: pt able to advance LLE w/ no difficulty Mobility (including Balance) Bed Mobility Bed Mobility: Yes Supine to Sit: 3: Mod assist;HOB elevated (Comment degrees);With rails Supine to Sit Details (indicate cue type and reason): VC for precautions, technique to position self at edge of bed. Transfers Transfers: Yes Sit to Stand: 3: Mod assist;From bed;With upper extremity assist Sit to Stand Details (indicate cue type and reason):  VC to push from bed, Stand to Sit: 4: Min assist;To chair/3-in-1;With upper extremity assist Stand to Sit Details: VC to reach to recliner , step LLE forward, control descent. Ambulation/Gait Ambulation/Gait: Yes Ambulation/Gait Assistance: 4: Min assist Ambulation/Gait Assistance Details (indicate cue type and reason): vc for sequence and PWB LLE, VC to try to keep LLE from IR Ambulation Distance (Feet): 15 Feet Assistive device: Rolling walker Gait Pattern: Step-to pattern;Antalgic Gait velocity: slow    Exercise    End of Session PT - End of Session Activity Tolerance: Patient limited by pain Patient left: in chair;with call bell in reach;with family/visitor present Nurse Communication: Mobility status for transfers (need for pain meds) General Behavior During Session: [facility] for tasks performed Cognition: [facility] for tasks performed  Rada Hay 05/28/2011, 11:58 AM  [phone]

## 2011-05-28 NOTE — Progress Notes (Signed)
05/28/11 1513  PT Visit Information  Last PT Received On 05/28/11  Precautions  Precautions Posterior Hip  Precaution Comments reviewed posterior hip precautions  Restrictions  LLE Weight Bearing PWB  LLE Partial Weight Bearing Percentage or Pounds 25-50  Total Joint Exercises  Ankle Circles/Pumps AROM;10 reps;Both  Quad Sets AROM;Left;10 reps  Short Arc Quad AROM;Left;10 reps  Heel Slides AROM;10 reps;Left;AAROM  Hip ABduction/ADduction Left;10 reps;AAROM  PT - End of Session  Activity Tolerance Patient tolerated treatment well  Patient left in bed;with call bell in reach;with family/visitor present  Nurse Communication Mobility status for transfers  General  Behavior During Session [facility] for tasks performed  Cognition [facility] for tasks performed  PT - Assessment/Plan  Comments on Treatment Session pt tolerated very well in spite of pain of 7/10 w/ activity. RN pesent and giving meds  PT Plan Discharge plan remains appropriate;Frequency remains appropriate  PT Frequency 7X/week  Follow Up Recommendations Home health PT  Equipment Recommended None recommended by OT  Acute Rehab PT Goals  PT Goal Formulation With patient  Time For Goal Achievement 7 days  Pt will go Sit to Supine/Side with supervision  PT Goal: Sit to Supine/Side - Progress Progressing toward goal  Pt will go Sit to Stand with supervision  PT Goal: Sit to Stand - Progress Progressing toward goal  Pt will go Stand to Sit with supervision  PT Goal: Stand to Sit - Progress Progressing toward goal  Pt will Ambulate 51 - 150 feet;with rolling walker;with supervision  PT Goal: Ambulate - Progress Progressing toward goal  Pt will Go Up / Down Stairs with rail(s);with least restrictive assistive device;with min assist;3-5 stairs  PT Goal: Up/Down Stairs - Progress Goal set today  Pt will Perform Home Exercise Program with supervision, verbal cues required/provided  PT Goal: Perform Home Exercise Program - Progress  Progressing toward goal  Additional Goals  Additional Goal #1 demo/state 3/3 posterior hip precautions  PT Goal: Additional Goal #1 - Progress Progressing toward goal

## 2011-05-28 NOTE — Evaluation (Signed)
Occupational Therapy Evaluation Patient Details Name: Maria [name] MRN: [id number] DOB: 01-24-1923 Today's Date: 05/28/2011  Problem List:  Patient Active Problem List  Diagnoses  . Osteoarthritis of hip    Past Medical History:  Past Medical History  Diagnosis Date  . Peripheral neuropathy   . PONV (postoperative nausea and vomiting)   . Rib fractures     IN THE PAST  . Osteoporosis   . Arthritis     OA AND PAIN LEFT HIP  . Headache     HX OF MIGRAINES  . Hypertension   . Allergic urticaria   . Shingles 1940'S  . Glaucoma   . Hypercholesterolemia    Past Surgical History:  Past Surgical History  Procedure Date  . Joint replacement 2011    RIGHT TOTAL HIP REPLACEMENT  . Tonsillectomy 1934  . Abdominal hysterectomy 1958  . Dilation and curettage of uterus     MULTIPLE  . Eye surgery 1998 & 2006    BILATERAL CATARACT EXTRACTION  . Appendectomy 1937  . External fixator left arm fracture 1997     OT Assessment/Plan/Recommendation OT Assessment Clinical Impression Statement: Pt doing well POD#1 LTHR. Skilled OT recommended to maximize I w/BADLs to supervision level in prep for d/c home with assistance from family. OT Recommendation/Assessment: Patient will need skilled OT in the acute care venue OT Problem List: Decreased activity tolerance;Decreased knowledge of use of DME or AE;Pain OT Therapy Diagnosis : Generalized weakness OT Plan OT Frequency: Min 2X/week OT Treatment/Interventions: Self-care/ADL training;Therapeutic activities;DME and/or AE instruction;Patient/family education OT Recommendation Follow Up Recommendations: No OT follow up Equipment Recommended: None recommended by OT Individuals Consulted Consulted and Agree with Results and Recommendations: Patient OT Goals Acute Rehab OT Goals OT Goal Formulation: With patient Time For Goal Achievement: 2 weeks ADL Goals Pt Will Perform Grooming: with supervision;Standing at sink (X 3 tasks to  improve standing activity tolerance.) ADL Goal: Grooming - Progress: Goal set today Pt Will Perform Lower Body Bathing: with supervision;Sit to stand from chair;Sit to stand from bed;with adaptive equipment ADL Goal: Lower Body Bathing - Progress: Goal set today Pt Will Perform Lower Body Dressing: with supervision;Sit to stand from chair;Sit to stand from bed;with adaptive equipment ADL Goal: Lower Body Dressing - Progress: Goal set today Pt Will Transfer to Toilet: with supervision;Comfort height toilet;3-in-1;Ambulation;Maintaining hip precautions;Maintaining weight bearing status ADL Goal: Toilet Transfer - Progress: Goal set today Pt Will Perform Toileting - Clothing Manipulation: with supervision;Standing ADL Goal: Toileting - Clothing Manipulation - Progress: Goal set today Pt Will Perform Toileting - Hygiene: with supervision;Sit to stand from 3-in-1/toilet ADL Goal: Toileting - Hygiene - Progress: Goal set today  OT Evaluation Precautions/Restrictions  Precautions Precautions: Posterior Hip Precaution Comments: reviewed posterior hip precautions Restrictions Weight Bearing Restrictions: Yes LLE Weight Bearing: Partial weight bearing LLE Partial Weight Bearing Percentage or Pounds: 25-50 Prior Functioning Home Living Lives With: Spouse Available Help at Discharge: Available 24 hours/day Type of Home: House Home Access: Stairs to enter Secretary/administrator of Steps: 4 Entrance Stairs-Rails: Can reach both;Left;Right Home Layout: One level Bathroom Shower/Tub: Engineer, manufacturing systems: Handicapped height Home Adaptive Equipment: Walker - rolling;Straight cane;Grab bars in shower;Grab bars around toilet;Tub transfer bench Prior Function Level of Independence: Independent Able to Take Stairs?: Yes  ADL ADL Grooming: Simulated;Set up Where Assessed - Grooming: Sitting, chair;Unsupported Upper Body Bathing: Simulated;Set up Where Assessed - Upper Body Bathing:  Sitting, chair;Unsupported Lower Body Bathing: Simulated;Minimal assistance Where Assessed - Lower Body Bathing: Sit to  stand from chair Upper Body Dressing: Simulated;Set up Where Assessed - Upper Body Dressing: Sitting, chair;Unsupported Lower Body Dressing: Simulated;Set up Lower Body Dressing Details (indicate cue type and reason): Pt states she still has all AE from previous hip sx and uses it all the time. Reviewed with pt how to don pants with reacher. Where Assessed - Lower Body Dressing: Sit to stand from chair Toilet Transfer: Performed;Minimal assistance Toilet Transfer Method: Ambulating Toilet Transfer Equipment: Raised toilet seat with arms (or 3-in-1 over toilet) Toileting - Clothing Manipulation: Simulated;Minimal assistance Where Assessed - Toileting Clothing Manipulation: Sit to stand from 3-in-1 or toilet Toileting - Hygiene: Simulated;Minimal assistance Where Assessed - Toileting Hygiene: Sit to stand from 3-in-1 or toilet Tub/Shower Transfer: Not assessed Tub/Shower Transfer Method: Not assessed Equipment Used: Reacher;Rolling walker (3:1) Ambulation Related to ADLs: Pt ambulated to the bathroom with min VCs for step sequence. ADL Comments: Pt able to recall 3/3 hip precautions. Vision/Perception    Cognition Cognition Overall Cognitive Status: Appears within functional limits for tasks assessed/performed Arousal/Alertness: Awake/alert Orientation Level: Appears intact for tasks assessed;Oriented X4 / Intact Behavior During Session: [facility] for tasks performed Sensation/Coordination Sensation Light Touch: Appears Intact Extremity Assessment RUE Assessment RUE Assessment: Within Functional Limits LUE Assessment LUE Assessment: Within Functional Limits Mobility  Bed Mobility Bed Mobility: No Supine to Sit: 3: Mod assist;HOB elevated (Comment degrees);With rails Supine to Sit Details (indicate cue type and reason): VC for precautions, technique to position self  at edge of bed. Transfers Transfers: Yes Sit to Stand: 3: Mod assist;From chair/3-in-1;With armrests;With upper extremity assist Sit to Stand Details (indicate cue type and reason): Vcs for hand placement. Stand to Sit: 4: Min assist;With upper extremity assist;To chair/3-in-1;With armrests Stand to Sit Details: VCs for hand placement and LE position Exercises   End of Session OT - End of Session Activity Tolerance: Patient tolerated treatment well Patient left: in chair;with call bell in reach General Behavior During Session: [facility] for tasks performed Cognition: [facility] for tasks performed   Tylor Gambrill A OTR/L [phone] 05/28/2011, 12:21 PM

## 2011-05-28 NOTE — Progress Notes (Signed)
Subjective: 1 Day Post-Op Procedure(s) (LRB): TOTAL HIP ARTHROPLASTY (Left) Patient reports pain as mild.   Patient seen in rounds by Dr. Lequita Halt. Patient doing well on day one. We will start therapy today. Plan is to go Home after hospital stay.  Objective: Vital signs in last 24 hours: Temp:  [97 F (36.1 C)-98.2 F (36.8 C)] 97.8 F (36.6 C) (04/18 0451) Pulse Rate:  [58-98] 68  (04/18 0451) Resp:  [14-21] 16  (04/18 0451) BP: (100-188)/(54-91) 105/54 mmHg (04/18 0451) SpO2:  [96 %-100 %] 99 % (04/18 0451) Weight:  [78.019 kg (172 lb)] 78.019 kg (172 lb) (04/17 1500)  Intake/Output from previous day:  Intake/Output Summary (Last 24 hours) at 05/28/11 0921 Last data filed at 05/28/11 0656  Gross per 24 hour  Intake 2676.25 ml  Output   2020 ml  Net 656.25 ml    Intake/Output this shift:    Labs:  Basename 05/28/11 0430  HGB 9.9*    Basename 05/28/11 0430  WBC 7.2  RBC 3.36*  HCT 30.7*  PLT 199    Basename 05/28/11 0430  NA 136  K 4.5  CL 103  CO2 28  BUN 13  CREATININE 0.71  GLUCOSE 156*  CALCIUM 8.4   No results found for this basename: LABPT:2,INR:2 in the last 72 hours  Exam - Neurovascular intact Sensation intact distally Dressing - clean, dry, no drainage Motor function intact - moving foot and toes well on exam.  Hemovac pulled without difficulty.  Past Medical History  Diagnosis Date  . Peripheral neuropathy   . PONV (postoperative nausea and vomiting)   . Rib fractures     IN THE PAST  . Osteoporosis   . Arthritis     OA AND PAIN LEFT HIP  . Headache     HX OF MIGRAINES  . Hypertension   . Allergic urticaria   . Shingles 1940'S  . Glaucoma   . Hypercholesterolemia     Assessment/Plan: 1 Day Post-Op Procedure(s) (LRB): TOTAL HIP ARTHROPLASTY (Left) Principal Problem:  *Osteoarthritis of hip   Advance diet Up with therapy Continue foley due to strict I&O and urinary output monitoring Discharge home with home  health  DVT Prophylaxis - Xarelto Partial-Weight Bearing 25-50% left Leg D/C Knee Immobilizer Hemovac Pulled Begin Therapy Hip Preacutions Keep foley until tomorrow. No vaccines.  Garvis Downum 05/28/2011, 9:21 AM

## 2011-05-28 NOTE — Progress Notes (Signed)
Physical Therapy Treatment Patient Details Name: Maria [name] MRN: [id number] DOB: 1922/07/30 Today's Date: 05/28/2011 1439-1450 PT Assessment/Plan  PT - Assessment/Plan Comments on Treatment Session: pt tolerated very well in spite of pain of 6/10 w/ activity.was premedicated PT Frequency: 7X/week Recommendations for Other Services: OT consult Follow Up Recommendations: Home health PT Equipment Recommended: None recommended by PT PT Goals  Acute Rehab PT Goals PT Goal Formulation: With patient Time For Goal Achievement: 7 days Pt will go Supine/Side to Sit: with supervision PT Goal: Supine/Side to Sit - Progress: Goal set today Pt will go Sit to Supine/Side: with supervision PT Goal: Sit to Supine/Side - Progress: Progressing toward goal Pt will go Sit to Stand: with supervision PT Goal: Sit to Stand - Progress: Progressing toward goal Pt will go Stand to Sit: with supervision PT Goal: Stand to Sit - Progress: Progressing toward goal Pt will Ambulate: 51 - 150 feet;with rolling walker;with supervision PT Goal: Ambulate - Progress: Progressing toward goal Pt will Go Up / Down Stairs: with rail(s);with least restrictive assistive device;with min assist;3-5 stairs PT Goal: Up/Down Stairs - Progress: Goal set today Pt will Perform Home Exercise Program: with supervision, verbal cues required/provided PT Goal: Perform Home Exercise Program - Progress: Goal set today Additional Goals Additional Goal #1: demo/state 3/3 posterior hip precautions PT Goal: Additional Goal #1 - Progress: Progressing toward goal  PT Treatment Precautions/Restrictions  Precautions Precautions: Posterior Hip Precaution Comments: reviewed posterior hip precautions Restrictions Weight Bearing Restrictions: Yes LLE Weight Bearing: Partial weight bearing LLE Partial Weight Bearing Percentage or Pounds: 25-50 Mobility (including Balance) Bed Mobility Bed Mobility: No  Sit to Supine: 3: Mod  assist Sit to Supine - Details (indicate cue type and reason): vc for hip precautions. assistance for LLE onto bed from L side of bed Transfers Transfers: Yes Sit to Stand: 3: Mod assist;From chair/3-in-1;With upper extremity assist Sit to Stand Details (indicate cue type and reason): vc for Hand placement Stand to Sit: 4: Min assist;With upper extremity assist Stand to Sit Details: vc to place hands and step LLE forward prior to sitting to bed Ambulation/Gait Ambulation/Gait: Yes Ambulation/Gait Assistance: 4: Min assist Ambulation/Gait Assistance Details (indicate cue type and reason):  vc for sequence and PWB, posture inside RW Ambulation Distance (Feet): 30 Feet Assistive device: Rolling walker Gait Pattern: Step-to pattern Gait velocity: slow    Exercise    End of Session PT - End of Session Activity Tolerance: Patient tolerated treatment well Patient left: in bed;with call bell in reach;with family/visitor present Nurse Communication: Mobility status for transfers General Behavior During Session: [facility] for tasks performed Cognition: [facility] for tasks performed  Rada Hay 05/28/2011, 3:15 PM

## 2011-05-29 LAB — CBC
MCH: 29.6 pg (ref 26.0–34.0)
MCV: 92.6 fL (ref 78.0–100.0)
Platelets: 173 10*3/uL (ref 150–400)
RDW: 14.4 % (ref 11.5–15.5)
WBC: 7.4 10*3/uL (ref 4.0–10.5)

## 2011-05-29 LAB — BASIC METABOLIC PANEL
Calcium: 8.5 mg/dL (ref 8.4–10.5)
Creatinine, Ser: 0.77 mg/dL (ref 0.50–1.10)
GFR calc Af Amer: 85 mL/min — ABNORMAL LOW (ref >90)
GFR calc non Af Amer: 73 mL/min — ABNORMAL LOW (ref >90)

## 2011-05-29 MED ORDER — TRAMADOL HCL 50 MG PO TABS: 50.0000 mg | ORAL_TABLET | Freq: Four times a day (QID) | ORAL | Status: AC | PRN

## 2011-05-29 MED ORDER — RIVAROXABAN 10 MG PO TABS: 10.0000 mg | ORAL_TABLET | Freq: Every day | ORAL | Status: DC

## 2011-05-29 MED ORDER — SODIUM CHLORIDE 0.9 % IV BOLUS (SEPSIS)
125.0000 mL | Freq: Once | INTRAVENOUS | Status: AC
  Administered 2011-05-29: 125 mL via INTRAVENOUS

## 2011-05-29 MED ORDER — METHOCARBAMOL 500 MG PO TABS: 500.0000 mg | ORAL_TABLET | Freq: Four times a day (QID) | ORAL | Status: AC | PRN

## 2011-05-29 NOTE — Discharge Instructions (Signed)
Hip Rehabilitation, Guidelines Following Surgery The results of a hip operation are greatly improved after range of motion and muscle strengthening exercises. Follow all safety measures which are given to protect your hip. If any of these exercises cause increased pain or swelling in your joint, decrease the amount until you are comfortable again. Then slowly increase the exercises. Call your caregiver if you have problems or questions. HOME CARE INSTRUCTIONS  Most of the following instructions are designed to prevent the dislocation of your new hip.  Do not put on socks or shoes without following the instructions of your caregivers.   Sit on high chairs so your hips are not bent more than 90 degrees.   Sit on chairs with arms. Use the chair arms to help push yourself up when arising.   Keep your leg on the side of the operation out in front of you when standing up.   Arrange for the use of a toilet seat elevator so you are not sitting low.   Do not do any exercises or get in any positions that cause your toes to point in (pigeon toed).   Always sleep with a pillow between your legs. Do not lie on your side in sleep with both knees touching the bed.   You may resume a sexual relationship in one month or when given the OK by your caregiver.   Use crutches or walker as long as suggested by your caregivers.   Begin weight bearing with your caregiver's approval.   Avoid periods of inactivity such as sitting longer than an hour when not asleep. This helps prevent blood clots.   Return to work as instructed by your caregiver.   Do not drive a car for 6 weeks or as instructed.   Do not drive while taking narcotics.   Wear elastic stockings until instructed not to.   Make sure you keep all of your appointments after your operation with all of your doctors and caregivers.  RANGE OF MOTION AND STRENGTHENING EXERCISES These exercises are designed to help you keep full movement of your hip  joint. Follow your caregiver's or physical therapist's instructions. Perform all exercises about fifteen times, three times per day or as directed. Exercise both hips, even if you have had only one joint replacement. These exercises can be done on a training (exercise) mat, on the floor, on a table or on a bed. Use whatever works the best and is most comfortable for you. Use music or television while you are exercising so that the exercises are a pleasant break in your day. This will make your life better with the exercises acting as a break in routine you can look forward to.  Lying on your back, slowly slide your foot toward your buttocks, raising your knee up off the floor. Then slowly slide your foot back down until your leg is straight again.   Lying on your back spread your legs as far apart as you can without causing discomfort.   Lying on your side, raise your upper leg and foot straight up from the floor as far as is comfortable. Slowly lower the leg and repeat.   Lying on your back, tighten up the muscle in the front of your thigh (quadriceps muscles). You can do this by keeping your leg straight and trying to raise your heel off the floor. This helps strengthen the largest muscle supporting your knee.   Lying on your back, tighten up the muscles of your buttocks both  with the legs straight and with the knee bent at a comfortable angle while keeping your heel on the floor. Document Released: 08/30/2003 Document Revised: 01/15/2011 Document Reviewed: 08/17/2007 [facility] Patient Information 2012 [place], [place].  Pick up stool softner and laxative for home. Do not submerge incision under water. May shower. Continue to use ice for pain and swelling from surgery. Hip precautions.  Total Hip Protocol.  Take Xarelto for two and a half more weeks, then discontinue Xarelto.

## 2011-05-29 NOTE — Progress Notes (Signed)
CARE MANAGEMENT NOTE 05/29/2011  Patient:  Maria [name], Maria [name]   Account Number:  [id number]  Date Initiated:  05/29/2011  Documentation initiated by:  Maria [name]  Subjective/Objective Assessment:   dx osteoarthritis left hip; total hip replacemnt     Action/Plan:   CM spoke with patient and spouse. plans are for patient to return to her home where spouse will be caregiver. Already has rw and 3N1 . She is requesting Maria [name] for Maria [name] services   Anticipated DC Date:  05/30/2011   Anticipated DC Plan:  HOME W HOME HEALTH SERVICES  In-house referral  Maria [name]      DC Planning Services  CM consult      Maria [name] Choice  HOME HEALTH   Choice offered to / List presented to:  C-1 Patient   DME arranged  NA      DME agency  NA     HH arranged  HH-2 PT      [name] agency  Maria [name]   Status of service:  Completed, signed off Medicare Important Message given?   (If response is "NO", the following Medicare IM given date fields will be blank)  05/29/2011 Maria [name] BSN CCM [phone] List of Maria [name] agencies placed in shadow chart. Maria [name] services in place with start date day after discharge.

## 2011-05-29 NOTE — Progress Notes (Signed)
05/29/11 1300  PT Visit Information  Last PT Received On 05/29/11  Assistance Needed +1  PT Time Calculation  PT Start Time 1340  PT Stop Time 1401  PT Time Calculation (min) 21 min  Subjective Data  Subjective sleepy  Precautions  Precautions Posterior Hip  Precaution Comments reviewed posterior hip precautions  Restrictions  LLE Weight Bearing PWB  LLE Partial Weight Bearing Percentage or Pounds 25-50  Cognition  Overall Cognitive Status Appears within functional limits for tasks assessed/performed  Arousal/Alertness Awake/alert  Orientation Level Appears intact for tasks assessed;Oriented X4 / Intact  Behavior During Session [facility] for tasks performed  Bed Mobility  Sit to Supine 4: Min assist  Details for Bed Mobility Assistance min with LLE, cues for THP  Transfers  Sit to Stand 4: Min assist;With armrests;From chair/3-in-1;With upper extremity assist  Stand to Sit 4: Min assist;4: Min guard;To bed;To chair/3-in-1;With armrests;With upper extremity assist  Details for Transfer Assistance cues for hand placement and THP  Ambulation/Gait  Ambulation/Gait Assistance 4: Min assist  Ambulation Distance (Feet) 10 Feet  Assistive device Rolling walker  Ambulation/Gait Assistance Details vc for sequence and PWB, posture inside RW  Gait Pattern Step-to pattern  PT - End of Session  Activity Tolerance Patient limited by fatigue (and nausea)  Patient left in bed;with call bell/phone within reach  Nurse Communication Other (comment) (nausea)  PT - Assessment/Plan  Comments on Treatment Session progressing well in spite of nausea and vomiting this pm  PT Plan Discharge plan remains appropriate;Frequency remains appropriate  PT Frequency 7X/week  Follow Up Recommendations Home health PT  Equipment Recommended None recommended by PT  Acute Rehab PT Goals  Potential to Achieve Goals Good  Pt will go Sit to Supine/Side with supervision  PT Goal: Sit to Supine/Side - Progress  Progressing toward goal  Pt will go Sit to Stand with supervision  PT Goal: Sit to Stand - Progress Progressing toward goal  Pt will go Stand to Sit with supervision  PT Goal: Stand to Sit - Progress Progressing toward goal  Pt will Ambulate 51 - 150 feet;with rolling walker;with supervision  PT Goal: Ambulate - Progress Progressing toward goal  Additional Goals  Additional Goal #1 demo/state 3/3 posterior hip precautions  PT Goal: Additional Goal #1 - Progress Progressing toward goal

## 2011-05-29 NOTE — Progress Notes (Signed)
OT Cancellation Note  _x__Treatment cancelled today due to medical issues with patient which prohibited therapy. Pt vomited and just back to bed. Will check back another time.  ___ Treatment cancelled today due to patient receiving procedure or test   ___ Treatment cancelled today due to patient's refusal to participate   ___ Treatment cancelled today due to   Signature: Garrel Ridgel, OTR/L  Pager [phone] 05/29/2011

## 2011-05-29 NOTE — Progress Notes (Signed)
Physical Therapy Treatment Patient Details Name: Maria [name] MRN: [id number] DOB: [birth year] Today's Date: 05/29/2011 Time: 1478-2956 PT Time Calculation (min): 18 min  PT Assessment / Plan / Recommendation Comments on Treatment Session  pt progressing well    Follow Up Recommendations  Home health PT    Equipment Recommendations  None recommended by PT    Frequency 7X/week    Precautions / Restrictions Precautions Precautions: Posterior Hip Precaution Comments: reviewed posterior hip precautions Restrictions LLE Weight Bearing: Partial weight bearing LLE Partial Weight Bearing Percentage or Pounds: 25-50   Pertinent Vitals/Pain     Mobility  Bed Mobility Details for Bed Mobility Assistance: pt in chair, up with nursing Transfers Sit to Stand: 4: Min assist;With armrests;From chair/3-in-1;With upper extremity assist Stand to Sit: 4: Min guard;To chair/3-in-1;With upper extremity assist;With armrests Details for Transfer Assistance: cues for hand placement and THP Ambulation/Gait Ambulation/Gait Assistance: 4: Min assist Ambulation Distance (Feet): 40 Feet Assistive device: Rolling walker Ambulation/Gait Assistance Details: vc for sequence and PWB, posture inside RW Gait Pattern: Step-to pattern General Gait Details: UB fatigue with increased gait distance    Exercises Total Joint Exercises Ankle Circles/Pumps: AROM;10 reps;Both Quad Sets: AROM;Left;10 reps   PT Goals Acute Rehab PT Goals Time For Goal Achievement: 06/03/11 Pt will go Sit to Stand: with supervision PT Goal: Sit to Stand - Progress: Progressing toward goal Pt will go Stand to Sit: with supervision PT Goal: Stand to Sit - Progress: Progressing toward goal Pt will Ambulate: 51 - 150 feet;with rolling walker;with supervision PT Goal: Ambulate - Progress: Progressing toward goal Pt will Perform Home Exercise Program: with supervision, verbal cues required/provided PT Goal: Perform Home  Exercise Program - Progress: Progressing toward goal  Visit Information  Last PT Received On: 05/29/11 Assistance Needed: +1 (+2 to bring chair])    Subjective Data  Subjective: I'm not hurting now but I will be!   Cognition  Overall Cognitive Status: Appears within functional limits for tasks assessed/performed Arousal/Alertness: Awake/alert Orientation Level: Appears intact for tasks assessed;Oriented X4 / Intact Behavior During Session: [facility] for tasks performed    Balance     End of Session Pt left in chair, call bell in reach, husband present    [facility] 05/29/2011, 11:41 AM

## 2011-05-29 NOTE — Progress Notes (Signed)
Subjective: 2 Days Post-Op Procedure(s) (LRB): TOTAL HIP ARTHROPLASTY (Left) Patient reports pain as mild.   Patient seen in rounds with Dr. Lequita Halt. Patient has complaints of some discomfort but looks good today. Plan is home.  Objective: Vital signs in last 24 hours: Temp:  [98 F (36.7 C)-99.8 F (37.7 C)] 99.2 F (37.3 C) (04/19 0508) Pulse Rate:  [69-101] 100  (04/19 0508) Resp:  [14-16] 16  (04/19 0508) BP: (99-129)/(53-74) 113/57 mmHg (04/19 0508) SpO2:  [93 %-96 %] 96 % (04/19 0508)  Intake/Output from previous day:  Intake/Output Summary (Last 24 hours) at 05/29/11 0845 Last data filed at 05/29/11 0700  Gross per 24 hour  Intake 1878.25 ml  Output   3125 ml  Net -1246.75 ml    Intake/Output this shift:    Labs:  Basename 05/29/11 0351 05/28/11 0430  HGB 10.0* 9.9*    Basename 05/29/11 0351 05/28/11 0430  WBC 7.4 7.2  RBC 3.38* 3.36*  HCT 31.3* 30.7*  PLT 173 199    Basename 05/29/11 0351 05/28/11 0430  NA 137 136  K 4.4 4.5  CL 101 103  CO2 28 28  BUN 12 13  CREATININE 0.77 0.71  GLUCOSE 113* 156*  CALCIUM 8.5 8.4   No results found for this basename: LABPT:2,INR:2 in the last 72 hours  Exam - Neurovascular intact Sensation intact distally Dressing/Incision - clean, dry, no drainage, healing Motor function intact - moving foot and toes well on exam.   Past Medical History  Diagnosis Date  . Peripheral neuropathy   . PONV (postoperative nausea and vomiting)   . Rib fractures     IN THE PAST  . Osteoporosis   . Arthritis     OA AND PAIN LEFT HIP  . Headache     HX OF MIGRAINES  . Hypertension   . Allergic urticaria   . Shingles 1940'S  . Glaucoma   . Hypercholesterolemia     Assessment/Plan: 2 Days Post-Op Procedure(s) (LRB): TOTAL HIP ARTHROPLASTY (Left) Principal Problem:  *Osteoarthritis of hip   Advance diet Up with therapy Plan for discharge tomorrow Discharge home with home health  DVT Prophylaxis -  Xarelto Partial-Weight Bearing 25-50% left Leg  Ajanae Virag 05/29/2011, 8:45 AM

## 2011-05-30 ENCOUNTER — Other Ambulatory Visit: Payer: Self-pay

## 2011-05-30 LAB — CBC
Platelets: 178 10*3/uL (ref 150–400)
RDW: 14.1 % (ref 11.5–15.5)
WBC: 6.8 10*3/uL (ref 4.0–10.5)

## 2011-05-30 MED ORDER — METOPROLOL SUCCINATE 12.5 MG HALF TABLET
12.5000 mg | ORAL_TABLET | Freq: Every day | ORAL | Status: DC
  Filled 2011-05-30: qty 1

## 2011-05-30 MED ORDER — BISACODYL 10 MG RE SUPP: 10.0000 mg | Freq: Once | RECTAL | Status: DC

## 2011-05-30 MED ORDER — BISACODYL 10 MG RE SUPP
10.0000 mg | Freq: Once | RECTAL | Status: DC
  Filled 2011-05-30: qty 1

## 2011-05-30 MED ORDER — SODIUM CHLORIDE 0.9 % IV BOLUS (SEPSIS)
250.0000 mL | Freq: Once | INTRAVENOUS | Status: AC
  Administered 2011-05-30: 11:00:00 via INTRAVENOUS

## 2011-05-30 NOTE — Progress Notes (Signed)
Physical Therapy Treatment Patient Details Name: Maria [name] MRN: [id number] DOB: [birth year] Today's Date: 05/30/2011 Time: 7846-9629 PT Time Calculation (min): 30 min  PT Assessment / Plan / Recommendation Comments on Treatment Session  pt has had nausea and low BP. no c/o dizziness during ambulation. pt plans DC 4/21. will practice steps in AM    Follow Up Recommendations  Home health PT    Equipment Recommendations  None recommended by PT    Frequency 7X/week   Plan Discharge plan remains appropriate    Precautions / Restrictions Precautions Precautions: Posterior Hip Precaution Comments: reviewed Post. hip precautions Restrictions LLE Weight Bearing: Partial weight bearing   Pertinent Vitals/Pain 3/10 w/ mobility, premedicated   Mobility  Bed Mobility Sit to Supine: 4: Min assist;HOB flat Details for Bed Mobility Assistance: pt required assistance to get LLE onto bed. vc for precautions Transfers Transfers: Sit to Stand;Stand to Sit Sit to Stand: 4: Min assist;From chair/3-in-1;With upper extremity assist Stand to Sit: 4: Min assist;To bed;With upper extremity assist Details for Transfer Assistance: pt  does hav some struggle to stanad from recliner after sitting fotr some time Ambulation/Gait Ambulation/Gait Assistance: 4: Min assist Ambulation Distance (Feet): 80 Feet Assistive device: Rolling walker Ambulation/Gait Assistance Details: pt6 able to advance, LLE, vc for sequence and vc for turns Gait Pattern: Step-to pattern Gait velocity: slow    Exercises Total Joint Exercises Ankle Circles/Pumps: AROM;10 reps;Both Quad Sets: AROM;Left;10 reps Short Arc Quad: AROM;Left;10 reps Heel Slides: 10 reps;Left;AAROM Hip ABduction/ADduction: Left;10 reps;AAROM   PT Goals Acute Rehab PT Goals PT Goal Formulation: With patient Time For Goal Achievement: 05/31/11 Potential to Achieve Goals: Good Pt will go Sit to Supine/Side: with supervision PT Goal: Sit to  Supine/Side - Progress: Progressing toward goal Pt will go Sit to Stand: with supervision PT Goal: Sit to Stand - Progress: Progressing toward goal Pt will go Stand to Sit: with supervision PT Goal: Stand to Sit - Progress: Progressing toward goal Pt will Ambulate: 51 - 150 feet;with rolling walker;with supervision PT Goal: Ambulate - Progress: Progressing toward goal Pt will Perform Home Exercise Program: with supervision, verbal cues required/provided PT Goal: Perform Home Exercise Program - Progress: Progressing toward goal Additional Goals Additional Goal #1: demo/state 3/3 posterior hip precautions  Visit Information  Last PT Received On: 05/30/11 Assistance Needed: +1    Subjective Data  Subjective: i haVE BEEN NAUSEATED BUT AM BETTER   Cognition  Overall Cognitive Status: Appears within functional limits for tasks assessed/performed Arousal/Alertness: Awake/alert Orientation Level: Appears intact for tasks assessed Behavior During Session: [facility] for tasks performed    Balance     End of Session PT - End of Session Activity Tolerance: Patient tolerated treatment well Patient left: in bed;with call bell/phone within reach Nurse Communication: Mobility status    Rada Hay 05/30/2011, 2:50 PM

## 2011-05-30 NOTE — Progress Notes (Signed)
Subjective: 3 Days Post-Op Procedure(s) (LRB): TOTAL HIP ARTHROPLASTY (Left) Patient reports pain as mild.   Patient has complaints of nausea. No BM yet. Hip is improving  Objective: Vital signs in last 24 hours: Temp:  [97.7 F (36.5 C)-98.5 F (36.9 C)] 98.1 F (36.7 C) (04/20 0610) Pulse Rate:  [78-93] 93  (04/20 0610) Resp:  [16] 16  (04/20 0610) BP: (97-104)/(57-62) 99/57 mmHg (04/20 0610) SpO2:  [91 %-92 %] 91 % (04/20 0610)  Intake/Output from previous day:  Intake/Output Summary (Last 24 hours) at 05/30/11 0840 Last data filed at 05/29/11 1830  Gross per 24 hour  Intake    360 ml  Output    365 ml  Net     -5 ml    Intake/Output this shift:    Labs:  Basename 05/30/11 0406 05/29/11 0351 05/28/11 0430  HGB 9.1* 10.0* 9.9*    Basename 05/30/11 0406 05/29/11 0351  WBC 6.8 7.4  RBC 3.09* 3.38*  HCT 28.3* 31.3*  PLT 178 173    Basename 05/29/11 0351 05/28/11 0430  NA 137 136  K 4.4 4.5  CL 101 103  CO2 28 28  BUN 12 13  CREATININE 0.77 0.71  GLUCOSE 113* 156*  CALCIUM 8.5 8.4   No results found for this basename: LABPT:2,INR:2 in the last 72 hours  Exam - Neurologically intact ABD soft Neurovascular intact Incision: no drainage Compartment soft Dressing/Incision - clean, dry, no drainage Motor function intact - moving foot and toes well on exam.   Past Medical History  Diagnosis Date  . Peripheral neuropathy   . PONV (postoperative nausea and vomiting)   . Rib fractures     IN THE PAST  . Osteoporosis   . Arthritis     OA AND PAIN LEFT HIP  . Headache     HX OF MIGRAINES  . Hypertension   . Allergic urticaria   . Shingles 1940'S  . Glaucoma   . Hypercholesterolemia     Assessment/Plan: 3 Days Post-Op Procedure(s) (LRB): TOTAL HIP ARTHROPLASTY (Left) Principal Problem:  *Osteoarthritis of hip   Advance diet Up with therapy Plan for discharge tomorrow Dulcolax suppository today  DVT Prophylaxis - Xarelto Partial-Weight  Bearing 25-50% left Leg  Toure Edmonds V 05/30/2011, 8:40 AM

## 2011-05-30 NOTE — Plan of Care (Signed)
Problem: Phase III Progression Outcomes Goal: Ambulate BID Outcome: Completed/Met Date Met:  05/30/11 Up to bathroom several times

## 2011-05-31 NOTE — Progress Notes (Signed)
Cm spoke with pt concerning dc planning. Previous dc plan confirmed with [facility] [place] services. [name] rep Lupita Leash notified of pt discharge.   Leonie Green [phone]

## 2011-05-31 NOTE — Progress Notes (Signed)
Patient given discharge instructions, verbalized understanding. Patient discharged home, taken home by husband. Alta Corning

## 2011-05-31 NOTE — Progress Notes (Signed)
Subjective: 4 Days Post-Op Procedure(s) (LRB): TOTAL HIP ARTHROPLASTY (Left) Patient reports pain as mild.   Patient seen in rounds with Dr. Lequita Halt. Patient doing well and ready to go home.  Objective: Vital signs in last 24 hours: Temp:  [97.6 F (36.4 C)-98.3 F (36.8 C)] 97.6 F (36.4 C) (04/21 0559) Pulse Rate:  [81-90] 81  (04/21 0559) Resp:  [16-18] 18  (04/21 0559) BP: (94-114)/(60-68) 114/60 mmHg (04/21 0559) SpO2:  [92 %-95 %] 95 % (04/21 0559)  Intake/Output from previous day:  Intake/Output Summary (Last 24 hours) at 05/31/11 0745 Last data filed at 05/31/11 0500  Gross per 24 hour  Intake    730 ml  Output   1500 ml  Net   -770 ml    Intake/Output this shift:    Labs:  Basename 05/30/11 0406 05/29/11 0351  HGB 9.1* 10.0*    Basename 05/30/11 0406 05/29/11 0351  WBC 6.8 7.4  RBC 3.09* 3.38*  HCT 28.3* 31.3*  PLT 178 173    Basename 05/29/11 0351  NA 137  K 4.4  CL 101  CO2 28  BUN 12  CREATININE 0.77  GLUCOSE 113*  CALCIUM 8.5   No results found for this basename: LABPT:2,INR:2 in the last 72 hours  Exam: Neurovascular intact Sensation intact distally Incision - clean, dry, no drainage, healing Motor function intact - moving foot and toes well on exam.   Assessment/Plan: 4 Days Post-Op Procedure(s) (LRB): TOTAL HIP ARTHROPLASTY (Left) Procedure(s) (LRB): TOTAL HIP ARTHROPLASTY (Left) Past Medical History  Diagnosis Date  . Peripheral neuropathy   . PONV (postoperative nausea and vomiting)   . Rib fractures     IN THE PAST  . Osteoporosis   . Arthritis     OA AND PAIN LEFT HIP  . Headache     HX OF MIGRAINES  . Hypertension   . Allergic urticaria   . Shingles 1940'S  . Glaucoma   . Hypercholesterolemia    Principal Problem:  *Osteoarthritis of hip   Up with therapy Discharge home with home health Diet - heart healthy Follow up - in 2 weeks Activity - PWB Disposition - Home Condition Upon Discharge - Good D/C Meds  - See DC Summary DVT Prophylaxis - Xarelto  Tunya Held 05/31/2011, 7:45 AM

## 2011-05-31 NOTE — Progress Notes (Signed)
Physical Therapy Treatment Patient Details Name: Maria [name] MRN: [id number] DOB: 1922/08/30 Today's Date: 05/31/2011 Time: 8295-6213 PT Time Calculation (min): 20 min  PT Assessment / Plan / Recommendation Comments on Treatment Session  pt  is ready for DC to home    Follow Up Recommendations  Home health PT    Equipment Recommendations  None recommended by PT    Frequency 7X/week   Plan Discharge plan remains appropriate    Precautions / Restrictions Precautions Precautions: Posterior Hip Precaution Comments: reviewed Posterior precautions Restrictions Weight Bearing Restrictions: Yes LLE Weight Bearing: Partial weight bearing LLE Partial Weight Bearing Percentage or Pounds: 25-50   Pertinent Vitals/Pain 2/10    Mobility  Transfers Transfers: Sit to Stand;Stand to Sit Sit to Stand: 4: Min guard;From chair/3-in-1;With upper extremity assist Stand to Sit: 4: Min guard;To chair/3-in-1 Details for Transfer Assistance: VC for precautions Ambulation/Gait Ambulation/Gait Assistance: 5: Supervision Ambulation Distance (Feet): 150 Feet Assistive device: Rolling walker Ambulation/Gait Assistance Details: pt is improving in Mobility and has increased  distance. Gait Pattern: Step-to pattern General Gait Details: pt is improved and is ready for DC. Stairs: Yes Stairs Assistance: 4: Min guard Stair Management Technique: Two rails;Forwards Number of Stairs: 2     Exercises     PT Goals Acute Rehab PT Goals PT Goal Formulation: With patient Time For Goal Achievement: 05/31/11 Potential to Achieve Goals: Good Pt will go Sit to Stand: with supervision PT Goal: Sit to Stand - Progress: Met Pt will go Stand to Sit: with supervision PT Goal: Stand to Sit - Progress: Met Pt will Ambulate: 51 - 150 feet;with rolling walker;with supervision PT Goal: Ambulate - Progress: Met Pt will Go Up / Down Stairs: 1-2 stairs;with rail(s) PT Goal: Up/Down Stairs - Progress:  Met Additional Goals Additional Goal #1: demo/state 3/3 posterior hip precautions PT Goal: Additional Goal #1 - Progress: Met  Visit Information  Last PT Received On: 05/31/11 Assistance Needed: +1    Subjective Data  Subjective: i am ready to go home   Cognition  Overall Cognitive Status: Appears within functional limits for tasks assessed/performed Arousal/Alertness: Awake/alert Orientation Level: Appears intact for tasks assessed Behavior During Session: [facility] for tasks performed    Balance     End of Session PT - End of Session Activity Tolerance: Patient tolerated treatment well Patient left: in chair;with call bell/phone within reach Nurse Communication: Mobility status    Rada Hay 05/31/2011, 12:48 PM

## 2011-06-01 DIAGNOSIS — D62 Acute posthemorrhagic anemia: Secondary | ICD-10-CM | POA: Diagnosis not present

## 2011-06-01 NOTE — Discharge Summary (Signed)
Physician Discharge Summary   Patient ID: Maria [name] MRN: [id number] DOB/AGE: [birth year] 78 y.o.  Admit date: 05/27/2011 Discharge date: [id number]  Primary Diagnosis: Osteoarthritis Left Hip   Admission Diagnoses:  Past Medical History  Diagnosis Date  . Peripheral neuropathy   . PONV (postoperative nausea and vomiting)   . Rib fractures     IN THE PAST  . Osteoporosis   . Arthritis     OA AND PAIN LEFT HIP  . Headache     HX OF MIGRAINES  . Hypertension   . Allergic urticaria   . Shingles 1940'S  . Glaucoma   . Hypercholesterolemia    Discharge Diagnoses:   Principal Problem:  *Osteoarthritis of hip Active Problems:  Postop Acute blood loss anemia  Procedure: Procedure(s) (LRB): TOTAL HIP ARTHROPLASTY (Left)   Consults: None  HPI: Maria [name] is a 79 y.o. female with end stage arthritis of her left hip with progressively worsening pain and dysfunction. Pain occurs with activity and rest including pain at night. She has tried analgesics, protected weight bearing and rest without benefit. Pain is too severe to attempt physical therapy. Radiographs demonstrate bone on bone arthritis with subchondral cyst formation. She presents now for left THA.  Laboratory Data: Hospital Outpatient Visit on 05/14/2011  Component Date Value Range Status  . MRSA, PCR  05/14/2011 NEGATIVE  NEGATIVE Final  . Staphylococcus aureus  05/14/2011 NEGATIVE  NEGATIVE Final   Comment:                                 The Xpert SA Assay (FDA                          approved for NASAL specimens                          only), is one component of                          a comprehensive surveillance                          program.  It is not intended                          to diagnose infection nor to                          guide or monitor treatment.  Marland Kitchen aPTT (seconds) 05/14/2011 37  24-37 Final   Comment:                                 IF BASELINE aPTT IS ELEVATED,                          SUGGEST PATIENT RISK ASSESSMENT                          BE USED TO DETERMINE APPROPRIATE                          ANTICOAGULANT THERAPY.  Marland Kitchen  WBC (K/uL) 05/14/2011 4.4  4.0-10.5 Final  . RBC (MIL/uL) 05/14/2011 4.28  3.87-5.11 Final  . Hemoglobin (g/dL) 16/11/9602 54.0  98.1-19.1 Final  . HCT (%) 05/14/2011 39.2  36.0-46.0 Final  . MCV (fL) 05/14/2011 91.6  78.0-100.0 Final  . MCH (pg) 05/14/2011 30.1  26.0-34.0 Final  . MCHC (g/dL) 47/82/9562 13.0  86.5-78.4 Final  . RDW (%) 05/14/2011 14.4  11.5-15.5 Final  . Platelets (K/uL) 05/14/2011 202  150-400 Final  . Sodium (mEq/L) 05/14/2011 140  135-145 Final  . Potassium (mEq/L) 05/14/2011 3.5  3.5-5.1 Final  . Chloride (mEq/L) 05/14/2011 101  96-112 Final  . CO2 (mEq/L) 05/14/2011 29  19-32 Final  . Glucose, Bld (mg/dL) 69/62/9528 413* 24-40 Final  . BUN (mg/dL) 12/06/2534 16  6-44 Final  . Creatinine, Ser (mg/dL) 03/47/4259 5.63  8.75-6.43 Final  . Calcium (mg/dL) 32/95/1884 9.0  1.6-60.6 Final  . Total Protein (g/dL) 30/16/0109 7.0  3.2-3.5 Final  . Albumin (g/dL) 57/32/2025 4.2  4.2-7.0 Final  . AST (U/L) 05/14/2011 20  0-37 Final  . ALT (U/L) 05/14/2011 11  0-35 Final  . Alkaline Phosphatase (U/L) 05/14/2011 105  39-117 Final  . Total Bilirubin (mg/dL) 62/37/6283 0.2* 1.5-1.7 Final  . GFR calc non Af Amer (mL/min) 05/14/2011 75* >90 Final  . GFR calc Af Amer (mL/min) 05/14/2011 87* >90 Final   Comment:                                 The eGFR has been calculated                          using the CKD EPI equation.                          This calculation has not been                          validated in all clinical                          situations.                          eGFR's persistently                          <90 mL/min signify                          possible Chronic Kidney Disease.  Marland Kitchen Prothrombin Time (seconds) 05/14/2011 13.6  11.6-15.2 Final  . INR  05/14/2011 1.02  0.00-1.49 Final  .  Color, Urine  05/14/2011 YELLOW  YELLOW Final  . APPearance  05/14/2011 CLEAR  CLEAR Final  . Specific Gravity, Urine  05/14/2011 1.017  1.005-1.030 Final  . pH  05/14/2011 5.5  5.0-8.0 Final  . Glucose, UA (mg/dL) 61/60/7371 NEGATIVE  NEGATIVE Final  . Hgb urine dipstick  05/14/2011 TRACE* NEGATIVE Final  . Bilirubin Urine  05/14/2011 NEGATIVE  NEGATIVE Final  . Ketones, ur (mg/dL) 08/05/9483 NEGATIVE  NEGATIVE Final  . Protein, ur (mg/dL) 46/27/0350 NEGATIVE  NEGATIVE Final  . Urobilinogen, UA (mg/dL) 09/38/1829 0.2  9.3-7.1 Final  . Nitrite  05/14/2011 NEGATIVE  NEGATIVE Final  .  Leukocytes, UA  05/14/2011 SMALL* NEGATIVE Final  . Squamous Epithelial / LPF  05/14/2011 RARE  RARE Final  . WBC, UA (WBC/hpf) 05/14/2011 0-2  <3 Final  . RBC / HPF (RBC/hpf) 05/14/2011 3-6  <3 Final  . Bacteria, UA  05/14/2011 RARE  RARE Final    Basename 05/30/11 0406  HGB 9.1*    Basename 05/30/11 0406  WBC 6.8  RBC 3.09*  HCT 28.3*  PLT 178   No results found for this basename: NA:2,K:2,CL:2,CO2:2,BUN:2,CREATININE:2,GLUCOSE:2,CALCIUM:2 in the last 72 hours No results found for this basename: LABPT:2,INR:2 in the last 72 hours  X-Rays: Chest 2 View  05/14/2011  *RADIOLOGY REPORT*  Clinical Data: Preop hip replacement  CHEST - 2 VIEW  Comparison: None.  Findings: Hyperaeration.  Upper normal heart size.  Linear atelectasis at the left base.  No pneumothorax or pleural effusion.  IMPRESSION: No active cardiopulmonary disease.  Original Report Authenticated By: Donavan Burnet, M.D.   X-ray Hip Left Ap And Lateral  05/14/2011  *RADIOLOGY REPORT*  Clinical Data: Preop exam.  Left hip pain.  LEFT HIP - COMPLETE 2+ VIEW  Comparison: None.  Findings: Marked left hip joint degenerative changes with subchondral cystic changes of the the acetabulum and femoral head.  Prior right hip replacement without complication noted.  Vascular calcifications.  IMPRESSION: Marked left hip joint degenerative changes.   Original Report Authenticated By: Fuller Canada, M.D.   Dg Pelvis Portable  05/27/2011  *RADIOLOGY REPORT*  Clinical Data: 78 year old female status post left hip arthroplasty.  PORTABLE PELVIS  Comparison: Preoperative study 05/14/2011.  Findings: Portable AP view the pelvis 1250 hours.  Stable right total hip arthroplasty changes.  New left hip bipolar arthroplasty. Hardware components appear intact and normally aligned on this AP view.  Overlying soft tissue changes including postoperative drain in place.  No unexpected osseous changes.  IMPRESSION: Left hip arthroplasty with no adverse features.  Original Report Authenticated By: Harley Hallmark, M.D.   Dg Hip Portable 1 View Left  05/27/2011  *RADIOLOGY REPORT*  Clinical Data: 78 year old female status post left hip surgery.  PORTABLE LEFT HIP - 1 VIEW  Comparison: Preoperative study 05/14/2011.  Findings: AP portable view 1252 hours.  Left bipolar hip arthroplasty.  Components appear intact and normally aligned on this single view.  Overlying soft tissue postoperative changes. Postoperative drain in place.  No unexpected osseous changes identified.  IMPRESSION: Left hip arthroplasty with no adverse features.  Original Report Authenticated By: Harley Hallmark, M.D.    EKG: Orders placed during the hospital encounter of 05/27/11  . EKG 12-LEAD  . EKG 12-LEAD  . EKG 12-LEAD  . EKG 12-LEAD     Hospital Course: Patient was admitted to [facility] and taken to the OR and underwent the above state procedure without complications.  Patient tolerated the procedure well and was later transferred to the recovery room and then to the orthopaedic floor for postoperative care.  They were given PO and IV analgesics for pain control following their surgery.  They were given 24 hours of postoperative antibiotics and started on DVT prophylaxis in the form of Xarelto.   PT and OT were ordered for total hip protocol.  The patient was allowed to be PWB  with therapy. Discharge planning was consulted to help with postop disposition and equipment needs.  Patient had a good night on the evening of surgery and started to get up OOB with therapy on day one.  Hemovac drain was pulled  without difficulty.  The knee immobilizer was removed and discontinued.  Continued to work with therapy into day two and was walking over 40 feet.  Dressing was changed on day two and the incision was healing well.  By day three, the patient had some complaints of nausea and no bowel movement so she received suppository.  By the next day, POD 4, she had progressed with therapy and meeting their goals.  Incision was healing well.  Patient was seen in rounds and was ready to go home.  Discharge Medications: Prior to Admission medications   Medication Sig Start Date End Date Taking? Authorizing Provider  furosemide (LASIX) 20 MG tablet Take 20 mg by mouth daily as needed. Fluid    Yes Historical Provider, MD  loteprednol (LOTEMAX) 0.5 % ophthalmic suspension Place 1 drop into both eyes daily. OINTMENT TO RUB ON EYELASHES   Yes Historical Provider, MD  Loteprednol Etabonate (LOTEMAX) 0.5 % OINT Apply to eye. PT RUBS THE OINTMENT ON EYELASHES OF BOTH EYES DAILY   Yes Historical Provider, MD  metoprolol succinate (TOPROL-XL) 25 MG 24 hr tablet Take 12.5 mg by mouth daily with breakfast.   Yes Historical Provider, MD  methocarbamol (ROBAXIN) 500 MG tablet Take 1 tablet (500 mg total) by mouth every 6 (six) hours as needed. 05/29/11 06/08/11  Karlissa Aron Julien Girt, PA  rivaroxaban (XARELTO) 10 MG TABS tablet Take 1 tablet (10 mg total) by mouth daily with breakfast. Take for two and a half more weeks, then discontinue Xarelto. 05/29/11   Rajah Tagliaferro Julien Girt, PA  traMADol (ULTRAM) 50 MG tablet Take 1-2 tablets (50-100 mg total) by mouth every 6 (six) hours as needed. 05/29/11 06/08/11  Raychelle Hudman Julien Girt, PA    Diet: heart healthy Activity:PWB No bending hip over 90 degrees- A "L"  Angle Do not cross legs Do not let foot roll inward When turning these patients a pillow should be placed between the patient's legs to prevent crossing. Patients should have the affected knee fully extended when trying to sit or stand from all surfaces to prevent excessive hip flexion. When ambulating and turning toward the affected side the affected leg should have the toes turned out prior to moving the walker and the rest of patient's body as to prevent internal rotation/ turning in of the leg. Abduction pillows are the most effective way to prevent a patient from not crossing legs or turning toes in at rest. If an abduction pillow is not ordered placing a regular pillow length wise between the patient's legs is also an effective reminder. It is imperative that these precautions be maintained so that the surgical hip does not dislocate. Follow-up:in 2 weeks Disposition - Home Discharged Condition: good   Discharge Orders    Future Orders Please Complete By Expires   Diet - low sodium heart healthy      Call MD / Call 911      Comments:   If you experience chest pain or shortness of breath, CALL 911 and be transported to the hospital emergency room.  If you develope a fever above 101 F, pus (white drainage) or increased drainage or redness at the wound, or calf pain, call your surgeon's office.   Constipation Prevention      Comments:   Drink plenty of fluids.  Prune juice may be helpful.  You may use a stool softener, such as Colace (over the counter) 100 mg twice a day.  Use MiraLax (over the counter) for constipation as needed.   Increase  activity slowly as tolerated      Weight Bearing as taught in Physical Therapy      Comments:   Use a walker or crutches as instructed.   Discharge instructions      Comments:   Pick up stool softner and laxative for home. Do not submerge incision under water. May shower. Continue to use ice for pain and swelling from surgery. Hip precautions.   Total Hip Protocol.  Take Xarelto for two and a half more weeks, then discontinue Xarelto.   Driving restrictions      Comments:   No driving   Lifting restrictions      Comments:   No lifting   Follow the hip precautions as taught in Physical Therapy      Change dressing      Comments:   You may change your dressing daily with sterile 4 x 4 inch gauze dressing and paper tape.   TED hose      Comments:   Use stockings (TED hose) for 3 weeks on both leg(s).  You may remove them at night for sleeping.     Medication List  As of 06/01/2011 10:26 AM   STOP taking these medications         VITAMIN B COMPLEX IJ         TAKE these medications         furosemide 20 MG tablet   Commonly known as: LASIX   Take 20 mg by mouth daily as needed. Fluid        loteprednol 0.5 % ophthalmic suspension   Commonly known as: LOTEMAX   Place 1 drop into both eyes daily. OINTMENT TO RUB ON EYELASHES      LOTEMAX 0.5 % Oint   Generic drug: Loteprednol Etabonate   Apply to eye. PT RUBS THE OINTMENT ON EYELASHES OF BOTH EYES DAILY      methocarbamol 500 MG tablet   Commonly known as: ROBAXIN   Take 1 tablet (500 mg total) by mouth every 6 (six) hours as needed.      metoprolol succinate 25 MG 24 hr tablet   Commonly known as: TOPROL-XL   Take 12.5 mg by mouth daily with breakfast.      rivaroxaban 10 MG Tabs tablet   Commonly known as: XARELTO   Take 1 tablet (10 mg total) by mouth daily with breakfast. Take for two and a half more weeks, then discontinue Xarelto.      traMADol 50 MG tablet   Commonly known as: ULTRAM   Take 1-2 tablets (50-100 mg total) by mouth every 6 (six) hours as needed.           Follow-up Information    Follow up with Loanne Drilling, MD. Schedule an appointment as soon as possible for a visit in 2 weeks.   Contact information:   [facility] [address] [place] [address] [phone]           Signed: Patrica Duel 06/01/2011, 10:26 AM

## 2011-06-08 ENCOUNTER — Encounter (HOSPITAL_COMMUNITY): Payer: Self-pay | Admitting: Orthopedic Surgery

## 2014-10-11 ENCOUNTER — Telehealth: Payer: Self-pay | Admitting: Cardiovascular Disease

## 2014-10-11 NOTE — Telephone Encounter (Signed)
Received records from [facility] for appointment on 11/16/14 with Dr Gwenlyn Found.  Records given to [facility] (medical records) for Dr Kennon Holter schedule on 11/16/14. lp

## 2014-11-16 ENCOUNTER — Ambulatory Visit (INDEPENDENT_AMBULATORY_CARE_PROVIDER_SITE_OTHER): Payer: Medicare Other | Admitting: Cardiovascular Disease

## 2014-11-16 ENCOUNTER — Encounter: Payer: Self-pay | Admitting: Cardiovascular Disease

## 2014-11-16 VITALS — BP 180/90 | HR 73

## 2014-11-16 DIAGNOSIS — E785 Hyperlipidemia, unspecified: Secondary | ICD-10-CM | POA: Diagnosis not present

## 2014-11-16 DIAGNOSIS — I10 Essential (primary) hypertension: Secondary | ICD-10-CM

## 2014-11-16 DIAGNOSIS — D62 Acute posthemorrhagic anemia: Secondary | ICD-10-CM

## 2014-11-16 NOTE — Patient Instructions (Signed)
Medication Instructions:  Your physician recommends that you continue on your current medications as directed. Please refer to the Current Medication list given to you today.   Labwork: none  Testing/Procedures: none  Follow-Up: Follow up with Dr. Berry as needed.   Any Other Special Instructions Will Be Listed Below (If Applicable).   

## 2014-11-16 NOTE — Assessment & Plan Note (Signed)
History of hyperlipidemia currently not on a statin drug followed by her PCP 

## 2014-11-16 NOTE — Progress Notes (Signed)
11/16/2014 Maria [name]   October 20, 1922  [id number]  Primary Physician Jani Gravel, MD Primary Cardiologist: Lorretta Harp MD Renae Gloss   HPI:  Maria [name] is a delightful 81 year old mildly overweight married Caucasian female (second marriage) who just celebrated her 57th wedding anniversary. She is a mother of 2 children one of whom is deceased and 2 granddaughters. She was referred by Dr. Maudie Mercury for cardiovascular evaluation. In the past she's worked as a Network engineer at [employer] is also worked at a Biochemist, clinical and an Metallurgist. She does have a history of hyperlipidemia currently not on statin therapy and hypertension treated with beta blocker. She's never had a heart attack or stroke. She denies chest pain or shortness of breath. She did have to total hip replacement in 2011 and 2013. She walks with a cane and is relatively asymptomatic.   Current Outpatient Prescriptions  Medication Sig Dispense Refill  . Cholecalciferol (VITAMIN D3) 2000 UNITS TABS Take 2,000 Units by mouth daily.    . Cyanocobalamin (VITAMIN B-12 PO) Take by mouth daily.    . furosemide (LASIX) 20 MG tablet Take 20 mg by mouth daily as needed. Fluid     . metoprolol succinate (TOPROL-XL) 25 MG 24 hr tablet Take 12.5 mg by mouth daily with breakfast.    . VITAMIN E PO Take by mouth daily.     No current facility-administered medications for this visit.    Allergies  Allergen Reactions  . Codeine Nausea And Vomiting  . Tylenol [Acetaminophen]     ITCHING  . Ibuprofen Itching and Rash    Social History   Social History  . Marital Status: Married    Spouse Name: N/A  . Number of Children: N/A  . Years of Education: N/A   Occupational History  . Not on file.   Social History Main Topics  . Smoking status: Former Research scientist (life sciences)  . Smokeless tobacco: Not on file     Comment: [place]  . Alcohol Use: No  . Drug Use: No  . Sexual Activity: Not on  file   Other Topics Concern  . Not on file   Social History Narrative     Review of Systems: General: negative for chills, fever, night sweats or weight changes.  Cardiovascular: negative for chest pain, dyspnea on exertion, edema, orthopnea, palpitations, paroxysmal nocturnal dyspnea or shortness of breath Dermatological: negative for rash Respiratory: negative for cough or wheezing Urologic: negative for hematuria Abdominal: negative for nausea, vomiting, diarrhea, bright red blood per rectum, melena, or hematemesis Neurologic: negative for visual changes, syncope, or dizziness All other systems reviewed and are otherwise negative except as noted above.    Blood pressure 180/90, pulse 73.  General appearance: alert and no distress Neck: no adenopathy, no carotid bruit, no JVD, supple, symmetrical, trachea midline and thyroid not enlarged, symmetric, no tenderness/mass/nodules Lungs: clear to auscultation bilaterally Heart: regular rate and rhythm, S1, S2 normal, no murmur, click, rub or gallop Extremities: extremities normal, atraumatic, no cyanosis or edema  EKG normal sinus rhythm at 73 with ST or T-wave changes. I personally reviewed this EKG  ASSESSMENT AND PLAN:   Hyperlipidemia History of hyperlipidemia currently not on a statin drug followed by her PCP  Essential hypertension History of hypertension blood pressure measured at 180/90. She is on metoprolol. She says she checks her blood pressure, usually runs in the 140/70 range      Lorretta Harp MD [facility], [facility] 11/16/2014  3:59 PM

## 2014-11-16 NOTE — Assessment & Plan Note (Signed)
History of hypertension blood pressure measured at 180/90. She is on metoprolol. She says she checks her blood pressure, usually runs in the 140/70 range

## 2015-03-12 ENCOUNTER — Emergency Department (HOSPITAL_COMMUNITY): Payer: Medicare Other

## 2015-03-12 ENCOUNTER — Observation Stay (HOSPITAL_COMMUNITY)
Admission: EM | Admit: 2015-03-12 | Discharge: 2015-03-14 | Disposition: A | Discharge status: Home / Self Care | Payer: Medicare Other | Attending: Internal Medicine | Admitting: Internal Medicine

## 2015-03-12 ENCOUNTER — Encounter (HOSPITAL_COMMUNITY): Payer: Self-pay | Admitting: Emergency Medicine

## 2015-03-12 DIAGNOSIS — G629 Polyneuropathy, unspecified: Secondary | ICD-10-CM | POA: Insufficient documentation

## 2015-03-12 DIAGNOSIS — Z7982 Long term (current) use of aspirin: Secondary | ICD-10-CM | POA: Diagnosis not present

## 2015-03-12 DIAGNOSIS — E7800 Pure hypercholesterolemia, unspecified: Secondary | ICD-10-CM | POA: Diagnosis not present

## 2015-03-12 DIAGNOSIS — H409 Unspecified glaucoma: Secondary | ICD-10-CM | POA: Insufficient documentation

## 2015-03-12 DIAGNOSIS — E785 Hyperlipidemia, unspecified: Secondary | ICD-10-CM | POA: Insufficient documentation

## 2015-03-12 DIAGNOSIS — Z87891 Personal history of nicotine dependence: Secondary | ICD-10-CM | POA: Insufficient documentation

## 2015-03-12 DIAGNOSIS — I10 Essential (primary) hypertension: Secondary | ICD-10-CM | POA: Diagnosis not present

## 2015-03-12 DIAGNOSIS — G459 Transient cerebral ischemic attack, unspecified: Secondary | ICD-10-CM | POA: Diagnosis present

## 2015-03-12 DIAGNOSIS — I700 Atherosclerosis of aorta: Secondary | ICD-10-CM | POA: Diagnosis not present

## 2015-03-12 DIAGNOSIS — Z886 Allergy status to analgesic agent status: Secondary | ICD-10-CM | POA: Diagnosis not present

## 2015-03-12 DIAGNOSIS — Z96641 Presence of right artificial hip joint: Secondary | ICD-10-CM | POA: Insufficient documentation

## 2015-03-12 DIAGNOSIS — R609 Edema, unspecified: Secondary | ICD-10-CM

## 2015-03-12 DIAGNOSIS — M1612 Unilateral primary osteoarthritis, left hip: Secondary | ICD-10-CM | POA: Diagnosis not present

## 2015-03-12 DIAGNOSIS — Z885 Allergy status to narcotic agent status: Secondary | ICD-10-CM | POA: Diagnosis not present

## 2015-03-12 DIAGNOSIS — G43909 Migraine, unspecified, not intractable, without status migrainosus: Secondary | ICD-10-CM | POA: Diagnosis not present

## 2015-03-12 DIAGNOSIS — R1310 Dysphagia, unspecified: Secondary | ICD-10-CM | POA: Diagnosis not present

## 2015-03-12 LAB — PROTIME-INR
INR: 1.06 (ref 0.00–1.49)
PROTHROMBIN TIME: 14 s (ref 11.6–15.2)

## 2015-03-12 LAB — DIFFERENTIAL
Basophils Absolute: 0 10*3/uL (ref 0.0–0.1)
Basophils Relative: 1 %
Eosinophils Absolute: 0.1 10*3/uL (ref 0.0–0.7)
Eosinophils Relative: 3 %
LYMPHS ABS: 1 10*3/uL (ref 0.7–4.0)
LYMPHS PCT: 21 %
Monocytes Absolute: 0.3 10*3/uL (ref 0.1–1.0)
Monocytes Relative: 7 %
NEUTROS ABS: 3.1 10*3/uL (ref 1.7–7.7)
NEUTROS PCT: 68 %

## 2015-03-12 LAB — COMPREHENSIVE METABOLIC PANEL
ALBUMIN: 4 g/dL (ref 3.5–5.0)
ALK PHOS: 93 U/L (ref 38–126)
ALT: 14 U/L (ref 14–54)
AST: 24 U/L (ref 15–41)
Anion gap: 9 (ref 5–15)
BILIRUBIN TOTAL: 0.6 mg/dL (ref 0.3–1.2)
BUN: 15 mg/dL (ref 6–20)
CALCIUM: 9.2 mg/dL (ref 8.9–10.3)
CO2: 27 mmol/L (ref 22–32)
CREATININE: 0.82 mg/dL (ref 0.44–1.00)
Chloride: 106 mmol/L (ref 101–111)
GFR calc Af Amer: >60 mL/min (ref >60)
GFR calc non Af Amer: >60 mL/min (ref >60)
GLUCOSE: 103 mg/dL — AB (ref 65–99)
Potassium: 4.1 mmol/L (ref 3.5–5.1)
SODIUM: 142 mmol/L (ref 135–145)
Total Protein: 6.5 g/dL (ref 6.5–8.1)

## 2015-03-12 LAB — I-STAT CHEM 8, ED
BUN: 17 mg/dL (ref 6–20)
CALCIUM ION: 1.13 mmol/L (ref 1.13–1.30)
CHLORIDE: 103 mmol/L (ref 101–111)
CREATININE: 0.9 mg/dL (ref 0.44–1.00)
Glucose, Bld: 94 mg/dL (ref 65–99)
HCT: 43 % (ref 36.0–46.0)
Hemoglobin: 14.6 g/dL (ref 12.0–15.0)
Potassium: 4 mmol/L (ref 3.5–5.1)
SODIUM: 142 mmol/L (ref 135–145)
TCO2: 27 mmol/L (ref 0–100)

## 2015-03-12 LAB — GLUCOSE, CAPILLARY: Glucose-Capillary: 99 mg/dL (ref 65–99)

## 2015-03-12 LAB — CBC
HCT: 40.4 % (ref 36.0–46.0)
HEMOGLOBIN: 13.1 g/dL (ref 12.0–15.0)
MCH: 29.9 pg (ref 26.0–34.0)
MCHC: 32.4 g/dL (ref 30.0–36.0)
MCV: 92.2 fL (ref 78.0–100.0)
PLATELETS: 175 10*3/uL (ref 150–400)
RBC: 4.38 MIL/uL (ref 3.87–5.11)
RDW: 14.4 % (ref 11.5–15.5)
WBC: 4.5 10*3/uL (ref 4.0–10.5)

## 2015-03-12 LAB — APTT: aPTT: 31 seconds (ref 24–37)

## 2015-03-12 LAB — I-STAT TROPONIN, ED: Troponin i, poc: 0.01 ng/mL (ref 0.00–0.08)

## 2015-03-12 MED ORDER — ONDANSETRON HCL 4 MG/2ML IJ SOLN: 4.0000 mg | Freq: Four times a day (QID) | INTRAMUSCULAR | Status: DC | PRN

## 2015-03-12 MED ORDER — SODIUM CHLORIDE 0.9% FLUSH
3.0000 mL | Freq: Two times a day (BID) | INTRAVENOUS | Status: DC
  Administered 2015-03-14: 3 mL via INTRAVENOUS

## 2015-03-12 MED ORDER — METOPROLOL SUCCINATE ER 25 MG PO TB24
12.5000 mg | ORAL_TABLET | Freq: Every day | ORAL | Status: DC
  Administered 2015-03-13 – 2015-03-14 (×2): 12.5 mg via ORAL
  Filled 2015-03-12 (×2): qty 1

## 2015-03-12 MED ORDER — SODIUM CHLORIDE 0.9% FLUSH: 3.0000 mL | INTRAVENOUS | Status: DC | PRN

## 2015-03-12 MED ORDER — SODIUM CHLORIDE 0.9 % IV SOLN
INTRAVENOUS | Status: DC
  Administered 2015-03-12: via INTRAVENOUS

## 2015-03-12 MED ORDER — HYDRALAZINE HCL 20 MG/ML IJ SOLN
5.0000 mg | INTRAMUSCULAR | Status: DC | PRN
  Filled 2015-03-12: qty 1

## 2015-03-12 MED ORDER — ENOXAPARIN SODIUM 40 MG/0.4ML ~~LOC~~ SOLN
40.0000 mg | SUBCUTANEOUS | Status: DC
  Administered 2015-03-12 – 2015-03-13 (×2): 40 mg via SUBCUTANEOUS
  Filled 2015-03-12 (×2): qty 0.4

## 2015-03-12 MED ORDER — SODIUM CHLORIDE 0.9 % IV SOLN: 250.0000 mL | INTRAVENOUS | Status: DC | PRN

## 2015-03-12 MED ORDER — GADOBENATE DIMEGLUMINE 529 MG/ML IV SOLN
16.0000 mL | Freq: Once | INTRAVENOUS | Status: AC
  Administered 2015-03-12: 16 mL via INTRAVENOUS

## 2015-03-12 MED ORDER — FUROSEMIDE 20 MG PO TABS
20.0000 mg | ORAL_TABLET | Freq: Every day | ORAL | Status: DC | PRN
Start: 2015-03-12 — End: 2015-03-14

## 2015-03-12 MED ORDER — STROKE: EARLY STAGES OF RECOVERY BOOK
Freq: Once | Status: DC
  Filled 2015-03-12: qty 1

## 2015-03-12 MED ORDER — ASPIRIN EC 81 MG PO TBEC
81.0000 mg | DELAYED_RELEASE_TABLET | Freq: Every day | ORAL | Status: DC
  Administered 2015-03-12: 81 mg via ORAL
  Filled 2015-03-12: qty 1

## 2015-03-12 MED ORDER — ONDANSETRON HCL 4 MG PO TABS: 4.0000 mg | ORAL_TABLET | Freq: Four times a day (QID) | ORAL | Status: DC | PRN

## 2015-03-12 NOTE — Progress Notes (Signed)
Patient arrived to 5M18 at 1500. Patient alert and oriented X4 with no c/o pain. Vital signs taken and charted. Telemetry applied and CCMD notified. Oriented to room with bed alarm on . Diet ordered. Family at bedside.

## 2015-03-12 NOTE — ED Notes (Signed)
Notified [facility] on cancellation of stroke, per [facility].

## 2015-03-12 NOTE — ED Provider Notes (Signed)
CSN: [id number]     Arrival date & time 03/12/15  [id number] History   First MD Initiated Contact with Patient 03/12/15 [phone]     Chief Complaint  Patient presents with  . Stroke Symptoms    HPI   82 year old female presents today with neurological complaints. Patient reports that she woke at 6 AM this morning, by 6:15 she had left-sided numbness and weakness to her left jaw shoulder and upper extremity. She also notes dysarthria at that time. She denies any loss of consciousness, change in mental status, her any lower extremity weakness or neurological deficits.  Patient reports she called 911, by the time they arrived she was asymptomatic. At the time of my evaluation patient denies any complaints including headache, changes in smell taste or vision, weakness numbness or tingling to any aspect of her body. She denies any difficulty speaking or swallowing. She has a history hypertension for which she takes metoprolol, reports that she took this prior to coming to the emergency room. Patient denies any history of ACS, stroke, or any other significant past medical history other than hypertension. Patient denies any head trauma, recent abnormal bruising or bleeding.    Past Medical History  Diagnosis Date  . Peripheral neuropathy ([place])   . PONV (postoperative nausea and vomiting)   . Rib fractures     IN THE PAST  . Osteoporosis   . Arthritis     OA AND PAIN LEFT HIP  . Headache(784.0)     HX OF MIGRAINES  . Hypertension   . Allergic urticaria   . Shingles 1940'S  . Glaucoma   . Hypercholesterolemia    Past Surgical History  Procedure Laterality Date  . Joint replacement  2011    RIGHT TOTAL HIP REPLACEMENT  . Tonsillectomy  1934  . Abdominal hysterectomy  1958  . Dilation and curettage of uterus      MULTIPLE  . Eye surgery  1998 & 2006    BILATERAL CATARACT EXTRACTION  . Appendectomy  1937  . External fixator left arm fracture 1997    . Total hip arthroplasty  05/27/2011     Procedure: TOTAL HIP ARTHROPLASTY;  Surgeon: Gearlean Alf, MD;  Location: [facility];  Service: Orthopedics;  Laterality: Left;   Family History  Problem Relation Age of Onset  . Heart disease Mother    Social History  Substance Use Topics  . Smoking status: Former Research scientist (life sciences)  . Smokeless tobacco: None     Comment: 44 YRS AGO  . Alcohol Use: No   OB History    No data available     Review of Systems  All other systems reviewed and are negative.     Allergies  Codeine; Tylenol; and Ibuprofen  Home Medications   Prior to Admission medications   Medication Sig Start Date End Date Taking? Authorizing Provider  Cholecalciferol (VITAMIN D3) 2000 UNITS TABS Take 2,000 Units by mouth daily.   Yes Historical Provider, MD  Cyanocobalamin (VITAMIN B-12 PO) Take 1 tablet by mouth daily.    Yes Historical Provider, MD  furosemide (LASIX) 20 MG tablet Take 20 mg by mouth daily as needed for fluid.    Yes Historical Provider, MD  metoprolol succinate (TOPROL-XL) 25 MG 24 hr tablet Take 12.5 mg by mouth daily with breakfast.   Yes Historical Provider, MD  VITAMIN E PO Take 1 tablet by mouth daily.    Yes Historical Provider, MD   BP 189/61 mmHg  Pulse 56  Temp(Src)  97.8 F (36.6 C) (Oral)  Resp 18  Ht 5\' 5"  (1.651 m)  Wt 77.111 kg  BMI 28.29 kg/m2  SpO2 98%   Physical Exam  Constitutional: She is oriented to person, place, and time. She appears well-developed and well-nourished.  HENT:  Head: Normocephalic and atraumatic.  Eyes: Conjunctivae are normal. Pupils are equal, round, and reactive to light. Right eye exhibits no discharge. Left eye exhibits no discharge. No scleral icterus.  Neck: Normal range of motion. No JVD present. No tracheal deviation present.  Pulmonary/Chest: Effort normal. No stridor.  Neurological: She is alert and oriented to person, place, and time. She has normal strength. No cranial nerve deficit or sensory deficit. She displays a negative Romberg sign.  Coordination normal. GCS eye subscore is 4. GCS verbal subscore is 5. GCS motor subscore is 6.  Reflex Scores:      Patellar reflexes are 2+ on the right side and 2+ on the left side. Psychiatric: She has a normal mood and affect. Her behavior is normal. Judgment and thought content normal.  Nursing note and vitals reviewed.   ED Course  Procedures (including critical care time) Labs Review Labs Reviewed  COMPREHENSIVE METABOLIC PANEL - Abnormal; Notable for the following:    Glucose, Bld 103 (*)    All other components within normal limits  PROTIME-INR  APTT  CBC  DIFFERENTIAL  CBC  COMPREHENSIVE METABOLIC PANEL  I-STAT TROPOININ, ED  I-STAT CHEM 8, ED  CBG MONITORING, ED    Imaging Review Ct Head Wo Contrast  03/12/2015  CLINICAL DATA:  Right-sided TIA. Left facial numbness, slurred speech. EXAM: CT HEAD WITHOUT CONTRAST TECHNIQUE: Contiguous axial images were obtained from the base of the skull through the vertex without intravenous contrast. COMPARISON:  None. FINDINGS: Mild cerebral atrophy. No acute intracranial abnormality. Specifically, no hemorrhage, hydrocephalus, mass lesion, acute infarction, or significant intracranial injury. No acute calvarial abnormality. Visualized paranasal sinuses and mastoids clear. Orbital soft tissues unremarkable. IMPRESSION: No acute intracranial abnormality. Electronically Signed   By: Rolm Baptise M.D.   On: 03/12/2015 10:43   Mr Virgel Paling Wo Contrast  03/12/2015  CLINICAL DATA:  82 year old hypertensive female with acute onset of left-sided numbness and weakness (face and left upper extremity) with associated difficulty speaking. Presently no complaints. Subsequent encounter. EXAM: MRI HEAD WITHOUT AND WITH CONTRAST MRA HEAD WITHOUT CONTRAST MRA NECK WITHOUT AND WITH CONTRAST TECHNIQUE: Multiplanar, multiecho pulse sequences of the brain and surrounding structures were obtained without and with intravenous contrast. Angiographic images of the  Circle of Willis were obtained using MRA technique without intravenous contrast. Angiographic images of the neck were obtained using MRA technique without and with intravenous contrast. Carotid stenosis measurements (when applicable) are obtained utilizing NASCET criteria, using the distal internal carotid diameter as the denominator. CONTRAST:  2mL MULTIHANCE GADOBENATE DIMEGLUMINE 529 MG/ML IV SOLN COMPARISON:  03/12/2015 head CT. FINDINGS: MRI HEAD FINDINGS Tiny acute infarct versus artifact peripheral aspect right parietal-occipital lobe junction. Otherwise no evidence of acute infarct. Remote small infarct occipital lobes. Mild to moderate chronic microvascular ischemic changes. No intracranial hemorrhage. No intracranial mass or abnormal enhancement. Global moderate atrophy without hydrocephalus. Post lens replacement otherwise orbital structures unremarkable. Cervical medullary junction pituitary region unremarkable. MRA HEAD FINDINGS Mild to moderate narrowing anterior aspect left internal carotid artery cavernous segment. Mild narrowing supraclinoid segment of the internal carotid artery bilaterally. Minimal bulge on left consistent with infundibulum rather than aneurysm. Mild to moderate focal stenosis junction A1 and A2 segment left  anterior cerebral artery. Middle cerebral artery branch vessel mild to moderate irregularity narrowing with decrease number of visualized right middle cerebral are branches compared to the left. No significant stenosis of the M1 segment or carotid terminus on either side. Ectatic vertebral arteries with mild narrowing on the right. Nonvisualized right posterior inferior cerebellar artery and left anterior inferior cerebellar artery. No high-grade stenosis of the basilar artery. High-grade stenosis proximal P2 segment right posterior cerebral artery. Moderate narrowing distal P2 segment left posterior cerebral artery. Posterior cerebral artery distal branch vessel narrowing  and irregularity bilaterally. MRA NECK FINDINGS Three vessel aortic arch. Ectatic right common carotid artery without significant narrowing. Plaque with narrowing proximal right internal carotid artery (less than 50%). Ectasia of the right internal carotid artery beyond this region with slight irregularity. Ectatic left internal carotid artery without significant stenosis. Mild moderate narrowing proximal aspect of the vertebral arteries bilaterally. Proximal 3 cm of the vertebral arteries with significant ectasia bilaterally. Mild narrowing proximal subclavian arteries bilaterally. IMPRESSION: MRI HEAD Tiny acute infarct versus artifact peripheral aspect right parietal-occipital lobe junction. Otherwise no evidence of acute infarct. Remote small infarct occipital lobes. Mild to moderate chronic microvascular ischemic changes. No intracranial hemorrhage. No intracranial mass or abnormal enhancement. Global moderate atrophy without hydrocephalus. MRA HEAD Mild to moderate narrowing anterior aspect left internal carotid artery cavernous segment. Mild narrowing supraclinoid segment of the internal carotid artery bilaterally. Mild to moderate focal stenosis junction A1 and A2 segment left anterior cerebral artery. Middle cerebral artery branch vessel mild to moderate irregularity narrowing with decrease number of visualized right middle cerebral are branches compared to the left. No significant stenosis of the M1 segment or carotid terminus on either side. Ectatic vertebral arteries with mild narrowing on the right. Nonvisualized right posterior inferior cerebellar artery and left anterior inferior cerebellar artery. No high-grade stenosis of the basilar artery. High-grade stenosis proximal P2 segment right posterior cerebral artery. Moderate narrowing distal P2 segment left posterior cerebral artery. Posterior cerebral artery distal branch vessel narrowing and irregularity bilaterally. MRA NECK Plaque with narrowing  proximal right internal carotid artery (less than 50%). Ectasia of the right internal carotid artery beyond this region with slight irregularity. Ectatic left internal carotid artery without significant stenosis. Mild to moderate narrowing proximal aspect of the vertebral arteries bilaterally. Mild narrowing proximal subclavian arteries bilaterally. Electronically Signed   By: Genia Del M.D.   On: 03/12/2015 14:29   Mr Angiogram Neck W Wo Contrast  03/12/2015  CLINICAL DATA:  82 year old hypertensive female with acute onset of left-sided numbness and weakness (face and left upper extremity) with associated difficulty speaking. Presently no complaints. Subsequent encounter. EXAM: MRI HEAD WITHOUT AND WITH CONTRAST MRA HEAD WITHOUT CONTRAST MRA NECK WITHOUT AND WITH CONTRAST TECHNIQUE: Multiplanar, multiecho pulse sequences of the brain and surrounding structures were obtained without and with intravenous contrast. Angiographic images of the Circle of Willis were obtained using MRA technique without intravenous contrast. Angiographic images of the neck were obtained using MRA technique without and with intravenous contrast. Carotid stenosis measurements (when applicable) are obtained utilizing NASCET criteria, using the distal internal carotid diameter as the denominator. CONTRAST:  50mL MULTIHANCE GADOBENATE DIMEGLUMINE 529 MG/ML IV SOLN COMPARISON:  03/12/2015 head CT. FINDINGS: MRI HEAD FINDINGS Tiny acute infarct versus artifact peripheral aspect right parietal-occipital lobe junction. Otherwise no evidence of acute infarct. Remote small infarct occipital lobes. Mild to moderate chronic microvascular ischemic changes. No intracranial hemorrhage. No intracranial mass or abnormal enhancement. Global moderate atrophy without hydrocephalus. Post  lens replacement otherwise orbital structures unremarkable. Cervical medullary junction pituitary region unremarkable. MRA HEAD FINDINGS Mild to moderate narrowing  anterior aspect left internal carotid artery cavernous segment. Mild narrowing supraclinoid segment of the internal carotid artery bilaterally. Minimal bulge on left consistent with infundibulum rather than aneurysm. Mild to moderate focal stenosis junction A1 and A2 segment left anterior cerebral artery. Middle cerebral artery branch vessel mild to moderate irregularity narrowing with decrease number of visualized right middle cerebral are branches compared to the left. No significant stenosis of the M1 segment or carotid terminus on either side. Ectatic vertebral arteries with mild narrowing on the right. Nonvisualized right posterior inferior cerebellar artery and left anterior inferior cerebellar artery. No high-grade stenosis of the basilar artery. High-grade stenosis proximal P2 segment right posterior cerebral artery. Moderate narrowing distal P2 segment left posterior cerebral artery. Posterior cerebral artery distal branch vessel narrowing and irregularity bilaterally. MRA NECK FINDINGS Three vessel aortic arch. Ectatic right common carotid artery without significant narrowing. Plaque with narrowing proximal right internal carotid artery (less than 50%). Ectasia of the right internal carotid artery beyond this region with slight irregularity. Ectatic left internal carotid artery without significant stenosis. Mild moderate narrowing proximal aspect of the vertebral arteries bilaterally. Proximal 3 cm of the vertebral arteries with significant ectasia bilaterally. Mild narrowing proximal subclavian arteries bilaterally. IMPRESSION: MRI HEAD Tiny acute infarct versus artifact peripheral aspect right parietal-occipital lobe junction. Otherwise no evidence of acute infarct. Remote small infarct occipital lobes. Mild to moderate chronic microvascular ischemic changes. No intracranial hemorrhage. No intracranial mass or abnormal enhancement. Global moderate atrophy without hydrocephalus. MRA HEAD Mild to moderate  narrowing anterior aspect left internal carotid artery cavernous segment. Mild narrowing supraclinoid segment of the internal carotid artery bilaterally. Mild to moderate focal stenosis junction A1 and A2 segment left anterior cerebral artery. Middle cerebral artery branch vessel mild to moderate irregularity narrowing with decrease number of visualized right middle cerebral are branches compared to the left. No significant stenosis of the M1 segment or carotid terminus on either side. Ectatic vertebral arteries with mild narrowing on the right. Nonvisualized right posterior inferior cerebellar artery and left anterior inferior cerebellar artery. No high-grade stenosis of the basilar artery. High-grade stenosis proximal P2 segment right posterior cerebral artery. Moderate narrowing distal P2 segment left posterior cerebral artery. Posterior cerebral artery distal branch vessel narrowing and irregularity bilaterally. MRA NECK Plaque with narrowing proximal right internal carotid artery (less than 50%). Ectasia of the right internal carotid artery beyond this region with slight irregularity. Ectatic left internal carotid artery without significant stenosis. Mild to moderate narrowing proximal aspect of the vertebral arteries bilaterally. Mild narrowing proximal subclavian arteries bilaterally. Electronically Signed   By: Genia Del M.D.   On: 03/12/2015 14:29   Mr Jeri Cos [id number] Contrast  03/12/2015  CLINICAL DATA:  82 year old hypertensive female with acute onset of left-sided numbness and weakness (face and left upper extremity) with associated difficulty speaking. Presently no complaints. Subsequent encounter. EXAM: MRI HEAD WITHOUT AND WITH CONTRAST MRA HEAD WITHOUT CONTRAST MRA NECK WITHOUT AND WITH CONTRAST TECHNIQUE: Multiplanar, multiecho pulse sequences of the brain and surrounding structures were obtained without and with intravenous contrast. Angiographic images of the Circle of Willis were obtained using  MRA technique without intravenous contrast. Angiographic images of the neck were obtained using MRA technique without and with intravenous contrast. Carotid stenosis measurements (when applicable) are obtained utilizing NASCET criteria, using the distal internal carotid diameter as the denominator. CONTRAST:  89mL MULTIHANCE GADOBENATE  DIMEGLUMINE 529 MG/ML IV SOLN COMPARISON:  03/12/2015 head CT. FINDINGS: MRI HEAD FINDINGS Tiny acute infarct versus artifact peripheral aspect right parietal-occipital lobe junction. Otherwise no evidence of acute infarct. Remote small infarct occipital lobes. Mild to moderate chronic microvascular ischemic changes. No intracranial hemorrhage. No intracranial mass or abnormal enhancement. Global moderate atrophy without hydrocephalus. Post lens replacement otherwise orbital structures unremarkable. Cervical medullary junction pituitary region unremarkable. MRA HEAD FINDINGS Mild to moderate narrowing anterior aspect left internal carotid artery cavernous segment. Mild narrowing supraclinoid segment of the internal carotid artery bilaterally. Minimal bulge on left consistent with infundibulum rather than aneurysm. Mild to moderate focal stenosis junction A1 and A2 segment left anterior cerebral artery. Middle cerebral artery branch vessel mild to moderate irregularity narrowing with decrease number of visualized right middle cerebral are branches compared to the left. No significant stenosis of the M1 segment or carotid terminus on either side. Ectatic vertebral arteries with mild narrowing on the right. Nonvisualized right posterior inferior cerebellar artery and left anterior inferior cerebellar artery. No high-grade stenosis of the basilar artery. High-grade stenosis proximal P2 segment right posterior cerebral artery. Moderate narrowing distal P2 segment left posterior cerebral artery. Posterior cerebral artery distal branch vessel narrowing and irregularity bilaterally. MRA NECK  FINDINGS Three vessel aortic arch. Ectatic right common carotid artery without significant narrowing. Plaque with narrowing proximal right internal carotid artery (less than 50%). Ectasia of the right internal carotid artery beyond this region with slight irregularity. Ectatic left internal carotid artery without significant stenosis. Mild moderate narrowing proximal aspect of the vertebral arteries bilaterally. Proximal 3 cm of the vertebral arteries with significant ectasia bilaterally. Mild narrowing proximal subclavian arteries bilaterally. IMPRESSION: MRI HEAD Tiny acute infarct versus artifact peripheral aspect right parietal-occipital lobe junction. Otherwise no evidence of acute infarct. Remote small infarct occipital lobes. Mild to moderate chronic microvascular ischemic changes. No intracranial hemorrhage. No intracranial mass or abnormal enhancement. Global moderate atrophy without hydrocephalus. MRA HEAD Mild to moderate narrowing anterior aspect left internal carotid artery cavernous segment. Mild narrowing supraclinoid segment of the internal carotid artery bilaterally. Mild to moderate focal stenosis junction A1 and A2 segment left anterior cerebral artery. Middle cerebral artery branch vessel mild to moderate irregularity narrowing with decrease number of visualized right middle cerebral are branches compared to the left. No significant stenosis of the M1 segment or carotid terminus on either side. Ectatic vertebral arteries with mild narrowing on the right. Nonvisualized right posterior inferior cerebellar artery and left anterior inferior cerebellar artery. No high-grade stenosis of the basilar artery. High-grade stenosis proximal P2 segment right posterior cerebral artery. Moderate narrowing distal P2 segment left posterior cerebral artery. Posterior cerebral artery distal branch vessel narrowing and irregularity bilaterally. MRA NECK Plaque with narrowing proximal right internal carotid artery  (less than 50%). Ectasia of the right internal carotid artery beyond this region with slight irregularity. Ectatic left internal carotid artery without significant stenosis. Mild to moderate narrowing proximal aspect of the vertebral arteries bilaterally. Mild narrowing proximal subclavian arteries bilaterally. Electronically Signed   By: Genia Del M.D.   On: 03/12/2015 14:29   I have personally reviewed and evaluated these images and lab results as part of my medical decision-making.   EKG Interpretation   Date/Time:  Tuesday March 12 2015 10:07:08 EST Ventricular Rate:  64 PR Interval:  196 QRS Duration: 102 QT Interval:  449 QTC Calculation: 463 R Axis:   -6 Text Interpretation:  Sinus rhythm Atrial premature complex ST elevation,  consider inferior injury  Confirmed by Hazle Coca [phone]) on 03/12/2015  10:10:26 AM      MDM   Final diagnoses:  TIA (transient ischemic attack)    Labs:  Imaging:  Consults: Nephrology, hospitalist  Therapeutics:  Discharge Meds:   Assessment/Plan: 82 year old female presents today with strokelike symptoms. Patient had acute onset and resolution prior to ED evaluation. Patient had a normal CT head Wo here in the ED no other significant findings. Neurology consult at 2 requested hospital admission for TIA workup. Patient remained asymptomatic here in the ED.         Okey Regal, PA-C 03/12/15 1711  Quintella Reichert, MD 03/13/15 [phone]

## 2015-03-12 NOTE — Evaluation (Signed)
Speech Language Pathology Evaluation Patient Details Name: Maria [name] MRN: [id number] DOB: 11/02/1922 Today's Date: 03/12/2015 Time: [id number] SLP Time Calculation (min) (ACUTE ONLY): 24 min  Problem List:  Patient Active Problem List   Diagnosis Date Noted  . TIA (transient ischemic attack) 03/12/2015  . Dependent edema 03/12/2015  . Dysphagia   . Hyperlipidemia 11/16/2014  . Essential hypertension 11/16/2014  . Postop Acute blood loss anemia 06/01/2011  . Osteoarthritis of hip 05/27/2011   Past Medical History:  Past Medical History  Diagnosis Date  . Peripheral neuropathy ([place])   . PONV (postoperative nausea and vomiting)   . Rib fractures     IN THE PAST  . Osteoporosis   . Arthritis     OA AND PAIN LEFT HIP  . Headache(784.0)     HX OF MIGRAINES  . Hypertension   . Allergic urticaria   . Shingles 1940'S  . Glaucoma   . Hypercholesterolemia    Past Surgical History:  Past Surgical History  Procedure Laterality Date  . Joint replacement  2011    RIGHT TOTAL HIP REPLACEMENT  . Tonsillectomy  1934  . Abdominal hysterectomy  1958  . Dilation and curettage of uterus      MULTIPLE  . Eye surgery  1998 & 2006    BILATERAL CATARACT EXTRACTION  . Appendectomy  1937  . External fixator left arm fracture 1997    . Total hip arthroplasty  05/27/2011    Procedure: TOTAL HIP ARTHROPLASTY;  Surgeon: Gearlean Alf, MD;  Location: [facility];  Service: Orthopedics;  Laterality: Left;   HPI:  82 yo female who presents with speech changes and left-sided numbness and weakness. Upon arrival to the ED, her symptoms have reportedly resolved. MRI shows a tiny acute infarct versus artifact peripheral aspect right parietal-occipital junction.   Assessment / Plan / Recommendation Clinical Impression  Pt's cognitive-linguistic skills appear to be at her baseline, and her speech is clear at the conversational level. She and her family are in agreement and do not have subjective  complaints. SLP to sign off.    SLP Assessment  Patient does not need any further Speech Lanaguage Pathology Services    Follow Up Recommendations  None    Frequency and Duration           SLP Evaluation Prior Functioning  Cognitive/Linguistic Baseline: Within functional limits Type of Home: House  Lives With: Spouse Available Help at Discharge: Family Vocation: Retired   Associate Professor  Overall Cognitive Status: Within Functional Limits for tasks assessed Orientation Level: Oriented X4    Comprehension  Auditory Comprehension Overall Auditory Comprehension: Appears within functional limits for tasks assessed    Expression Expression Primary Mode of Expression: Verbal Verbal Expression Overall Verbal Expression: Appears within functional limits for tasks assessed   Oral / Motor  Oral Motor/Sensory Function Overall Oral Motor/Sensory Function: Within functional limits Motor Speech Overall Motor Speech: Appears within functional limits for tasks assessed   GO          Functional Assessment Tool Used: skilled clinical judgment Functional Limitations: Memory Swallow Current Status [phone]): At least 1 percent but less than 20 percent impaired, limited or restricted Swallow Goal Status [phone]): At least 1 percent but less than 20 percent impaired, limited or restricted Swallow Discharge Status [phone]): At least 1 percent but less than 20 percent impaired, limited or restricted Memory Current Status [id number]): At least 1 percent but less than 20 percent impaired, limited or restricted Memory Goal Status (  [id number]): At least 1 percent but less than 20 percent impaired, limited or restricted Memory Discharge Status [id number]): At least 1 percent but less than 20 percent impaired, limited or restricted          Germain Osgood, M.A. CCC-SLP [phone]  Germain Osgood 03/12/2015, 4:10 PM

## 2015-03-12 NOTE — ED Notes (Signed)
To ED via [facility] -- from home with c/o numbness on left side of face radiating to left neck area and left arm, slight slurred speech. No symptoms at present, but c/o "feels like there is something in my throat-- difficult to swallow"

## 2015-03-12 NOTE — ED Notes (Signed)
Notified PA Merry Proud of patient BP 206/93, no recommendations at this time.

## 2015-03-12 NOTE — Consult Note (Signed)
Requesting Physician: Dr. Ayesha Rumpf    Chief Complaint:  Stroke/TIA  HPI:                                                                                                                                         Maria [name] is an 82 y.o. female woke up this AM at 0600 feeling normal. At [place] she noted her left arm was tingling and her voice was slurred. She also felt as if something was in her throat.  On arrival to ED her symptoms had resolved with exception she still felt her throat had something in it.  She had no difficulty with her secretions. NIHSS 0.  Date last known well: 1.31.2017 Time last known well: Time: 06:15 tPA Given: No: symptoms resolved     Past Medical History  Diagnosis Date  . Peripheral neuropathy ([place])   . PONV (postoperative nausea and vomiting)   . Rib fractures     IN THE PAST  . Osteoporosis   . Arthritis     OA AND PAIN LEFT HIP  . Headache(784.0)     HX OF MIGRAINES  . Hypertension   . Allergic urticaria   . Shingles 1940'S  . Glaucoma   . Hypercholesterolemia     Past Surgical History  Procedure Laterality Date  . Joint replacement  2011    RIGHT TOTAL HIP REPLACEMENT  . Tonsillectomy  1934  . Abdominal hysterectomy  1958  . Dilation and curettage of uterus      MULTIPLE  . Eye surgery  1998 & 2006    BILATERAL CATARACT EXTRACTION  . Appendectomy  1937  . External fixator left arm fracture 1997    . Total hip arthroplasty  05/27/2011    Procedure: TOTAL HIP ARTHROPLASTY;  Surgeon: Gearlean Alf, MD;  Location: [facility];  Service: Orthopedics;  Laterality: Left;    Family History  Problem Relation Age of Onset  . Heart disease Mother    Social History:  reports that she has quit smoking. She does not have any smokeless tobacco history on file. She reports that she does not drink alcohol or use illicit drugs.  Allergies:  Allergies  Allergen Reactions  . Codeine Nausea And Vomiting  . Tylenol [Acetaminophen]     ITCHING  .  Ibuprofen Itching and Rash    Medications:  No current facility-administered medications for this encounter.   Current Outpatient Prescriptions  Medication Sig Dispense Refill  . Cholecalciferol (VITAMIN D3) 2000 UNITS TABS Take 2,000 Units by mouth daily.    . Cyanocobalamin (VITAMIN B-12 PO) Take 1 tablet by mouth daily.     . furosemide (LASIX) 20 MG tablet Take 20 mg by mouth daily as needed for fluid.     . metoprolol succinate (TOPROL-XL) 25 MG 24 hr tablet Take 12.5 mg by mouth daily with breakfast.    . VITAMIN E PO Take 1 tablet by mouth daily.        ROS:                                                                                                                                       History obtained from the patient  General ROS: negative for - chills, fatigue, fever, night sweats, weight gain or weight loss Psychological ROS: negative for - behavioral disorder, hallucinations, memory difficulties, mood swings or suicidal ideation Ophthalmic ROS: negative for - blurry vision, double vision, eye pain or loss of vision ENT ROS: negative for - epistaxis, nasal discharge, oral lesions, sore throat, tinnitus or vertigo Allergy and Immunology ROS: negative for - hives or itchy/watery eyes Hematological and Lymphatic ROS: negative for - bleeding problems, bruising or swollen lymph nodes Endocrine ROS: negative for - galactorrhea, hair pattern changes, polydipsia/polyuria or temperature intolerance Respiratory ROS: negative for - cough, hemoptysis, shortness of breath or wheezing Cardiovascular ROS: negative for - chest pain, dyspnea on exertion, edema or irregular heartbeat Gastrointestinal ROS: negative for - abdominal pain, diarrhea, hematemesis, nausea/vomiting or stool incontinence Genito-Urinary ROS: negative for - dysuria, hematuria, incontinence or  urinary frequency/urgency Musculoskeletal ROS: negative for - joint swelling or muscular weakness Neurological ROS: as noted in HPI Dermatological ROS: negative for rash and skin lesion changes  Neurologic Examination:                                                                                                      Blood pressure 206/93, pulse 62, temperature 97.8 F (36.6 C), temperature source Oral, resp. rate 14, height 5\' 5"  (1.651 m), weight 77.111 kg (170 lb), SpO2 98 %.  HEENT-  Normocephalic, no lesions, without obvious abnormality.  Normal external eye and conjunctiva.  Normal TM's bilaterally.  Normal auditory canals and external ears. Normal external nose, mucus membranes and septum.  Normal pharynx. Cardiovascular- S1, S2 normal, pulses palpable throughout   Lungs-  chest clear, no wheezing, rales, normal symmetric air entry Abdomen- normal findings: bowel sounds normal Extremities- no skin discoloration Lymph-no adenopathy palpable Musculoskeletal-no joint tenderness, deformity or swelling Skin-warm and dry, no hyperpigmentation, vitiligo, or suspicious lesions  Neurological Examination Mental Status: Alert, oriented, thought content appropriate.  Speech fluent without evidence of aphasia.  Able to follow 3 step commands without difficulty. Cranial Nerves: II: Discs flat bilaterally; Visual fields grossly normal, pupils equal, round, reactive to light and accommodation III,IV, VI: ptosis not present, extra-ocular motions intact bilaterally V,VII: smile symmetric, facial light touch sensation normal bilaterally VIII: hearing normal bilaterally IX,X: uvula rises symmetrically XI: bilateral shoulder shrug XII: midline tongue extension Motor: Right : Upper extremity   5/5    Left:     Upper extremity   5/5  Lower extremity   5/5     Lower extremity   5/5 Tone and bulk:normal tone throughout; no atrophy noted Sensory: Pinprick and light touch intact throughout,  bilaterally Deep Tendon Reflexes: 1+ and symmetric throughout Plantars: Right: downgoing   Left: downgoing Cerebellar: normal finger-to-nose,  and normal heel-to-shin test Gait: not tested       Lab Results: Basic Metabolic Panel:  Recent Labs Lab 03/12/15 1031  NA 142  K 4.0  CL 103  GLUCOSE 94  BUN 17  CREATININE 0.90    Liver Function Tests: No results for input(s): AST, ALT, ALKPHOS, BILITOT, PROT, ALBUMIN in the last 168 hours. No results for input(s): LIPASE, AMYLASE in the last 168 hours. No results for input(s): AMMONIA in the last 168 hours.  CBC:  Recent Labs Lab 03/12/15 1031  HGB 14.6  HCT 43.0    Cardiac Enzymes: No results for input(s): CKTOTAL, CKMB, CKMBINDEX, TROPONINI in the last 168 hours.  Lipid Panel: No results for input(s): CHOL, TRIG, HDL, CHOLHDL, VLDL, LDLCALC in the last 168 hours.  CBG: No results for input(s): GLUCAP in the last 168 hours.  Microbiology: Results for orders placed or performed during the hospital encounter of 05/14/11  Surgical pcr screen     Status: None   Collection Time: 05/14/11  3:26 PM  Result Value Ref Range Status   MRSA, PCR NEGATIVE NEGATIVE Final   Staphylococcus aureus NEGATIVE NEGATIVE Final    Comment:        The Xpert SA Assay (FDA approved for NASAL specimens only), is one component of a comprehensive surveillance program.  It is not intended to diagnose infection nor to guide or monitor treatment.    Coagulation Studies: No results for input(s): LABPROT, INR in the last 72 hours.  Imaging: No results found.     Assessment and plan discussed with with attending physician and they are in agreement.    Etta Quill PA-C [facility] [phone]  03/12/2015, 10:39 AM   Assessment: 82 y.o. female with transient left-sided numbness and slurred speech. I do suspect that she had a transient ischemic attack and has already been admitted for further stroke risk factor  modification.   Stroke Risk Factors - hyperlipidemia and hypertension   1. HgbA1c, fasting lipid panel 2. MRI, MRA  of the brain without contrast 3. PT consult, OT consult, Speech consult 4. Echocardiogram 5. Carotid dopplers 6. Prophylactic therapy-Antiplatelet med: Aspirin - dose 325 mg daily 7. Risk factor modification 8. Telemetry monitoring 9. Frequent neuro checks 10 NPO until passes stroke swallow screen 11 please page stroke NP  Or  PA  Or MD  M-F from 8am -4 pm starting 2.102.17 as this  followed by the stroke team at this point.   You can look them up on [email or web address]  Password [email or web address]   Roland Rack, MD [facility] [phone]  If 7pm- 7am, please page neurology on call as listed in [place].

## 2015-03-12 NOTE — Code Documentation (Signed)
82yo female arriving to [facility] via EMS at [phone].  Patient reports waking up at her baseline at 0600 and notes onset of left shoulder and arm numbness at 0615.  Patient called her PCP then EMS.  Numbness resolved but patient reporting difficulty swallowing.  Code stroke called in the ED.  Patient to CT.  Stroke team to the bedside.  Patient reports that numbness has resolved.  Neuro PA to the bedside.  No acute stroke treatment at this time.  Patient to be admitted per Neuro PA.  Bedside handoff with ED RN Santiago Glad.

## 2015-03-12 NOTE — Evaluation (Signed)
Clinical/Bedside Swallow Evaluation Patient Details  Name: Maria [name] MRN: [id number] Date of Birth: 31-Aug-1922  Today's Date: 03/12/2015 Time: SLP Start Time (ACUTE ONLY): 1500 SLP Stop Time (ACUTE ONLY): 1524 SLP Time Calculation (min) (ACUTE ONLY): 24 min  Past Medical History:  Past Medical History  Diagnosis Date  . Peripheral neuropathy ([place])   . PONV (postoperative nausea and vomiting)   . Rib fractures     IN THE PAST  . Osteoporosis   . Arthritis     OA AND PAIN LEFT HIP  . Headache(784.0)     HX OF MIGRAINES  . Hypertension   . Allergic urticaria   . Shingles 1940'S  . Glaucoma   . Hypercholesterolemia    Past Surgical History:  Past Surgical History  Procedure Laterality Date  . Joint replacement  2011    RIGHT TOTAL HIP REPLACEMENT  . Tonsillectomy  1934  . Abdominal hysterectomy  1958  . Dilation and curettage of uterus      MULTIPLE  . Eye surgery  1998 & 2006    BILATERAL CATARACT EXTRACTION  . Appendectomy  1937  . External fixator left arm fracture 1997    . Total hip arthroplasty  05/27/2011    Procedure: TOTAL HIP ARTHROPLASTY;  Surgeon: Gearlean Alf, MD;  Location: [facility];  Service: Orthopedics;  Laterality: Left;   HPI:  82 yo female who presents with speech changes and left-sided numbness and weakness. Upon arrival to the ED, her symptoms have reportedly resolved. MRI shows a tiny acute infarct versus artifact peripheral aspect right parietal-occipital junction.   Assessment / Plan / Recommendation Clinical Impression  Pt's oropharyngeal swallow appears WFL. When asked about her globus sensation, she reports that this has been present for years, and is not an acute problem. Recommend a regular diet and thin liquids.    Aspiration Risk  No limitations    Diet Recommendation Regular;Thin liquid   Liquid Administration via: Cup;Straw Medication Administration: Whole meds with liquid Supervision: Patient able to self  feed Compensations: Slow rate;Small sips/bites Postural Changes: Seated upright at 90 degrees    Other  Recommendations Oral Care Recommendations: Oral care BID   Follow up Recommendations  None    Frequency and Duration            Prognosis        Swallow Study   General Date of Onset: 03/12/15 HPI: 82 yo female who presents with speech changes and left-sided numbness and weakness. Upon arrival to the ED, her symptoms have reportedly resolved. MRI shows a tiny acute infarct versus artifact peripheral aspect right parietal-occipital junction. Type of Study: Bedside Swallow Evaluation Previous Swallow Assessment: none in chart Diet Prior to this Study: NPO Temperature Spikes Noted: No Respiratory Status: Room air History of Recent Intubation: No Behavior/Cognition: Alert;Cooperative;Pleasant mood Oral Cavity Assessment: Within Functional Limits Oral Care Completed by SLP: No Oral Cavity - Dentition: Adequate natural dentition Vision: Functional for self-feeding Self-Feeding Abilities: Able to feed self Patient Positioning: Upright in bed Baseline Vocal Quality: Normal Volitional Cough: Strong Volitional Swallow: Able to elicit    Oral/Motor/Sensory Function Overall Oral Motor/Sensory Function: Within functional limits   Ice Chips Ice chips: Not tested   Thin Liquid Thin Liquid: Within functional limits Presentation: Cup;Self Fed;Straw    Nectar Thick Nectar Thick Liquid: Not tested   Honey Thick Honey Thick Liquid: Not tested   Puree Puree: Within functional limits Presentation: Self Fed;Spoon   Solid   GO  Solid: Within functional limits Presentation: Self Fed    Functional Assessment Tool Used: skilled clinical judgment Functional Limitations: Swallowing Swallow Current Status [id number]): At least 1 percent but less than 20 percent impaired, limited or restricted Swallow Goal Status [phone]): At least 1 percent but less than 20 percent impaired, limited or  restricted Swallow Discharge Status [phone]): At least 1 percent but less than 20 percent impaired, limited or restricted   Germain Osgood, M.A. CCC-SLP [phone]  Germain Osgood 03/12/2015,4:08 PM

## 2015-03-12 NOTE — ED Notes (Signed)
Pt transported to MRI. NAD.

## 2015-03-12 NOTE — H&P (Signed)
[facility] History and Physical  Maria [name] [id number] DOB: April 09, 1922 DOA: 03/12/2015  Referring physician: [place] PCP: Jani Gravel, MD   Chief Complaint: L sided numbness and weakness  HPI: Maria [name] is a 82 y.o. female  Awoke at 6am feeling nml. By 09:00 developed L sided numbness and weakness involving face adn LUE. Associated w/ difficulty speaking. Called EMS and sx resolved within 15-30 min. Currently no complaints.      Review of Systems:  Constitutional:  No weight loss, night sweats, Fevers, chills, fatigue.  HEENT:  No headaches, Difficulty swallowing,Tooth/dental problems,Sore throat, Cardio-vascular:  No chest pain, Orthopnea, PND, swelling in lower extremities, anasarca, dizziness, palpitations  GI:   No heartburn, indigestion, abdominal pain, nausea, vomiting, diarrhea, change in bowel habits, loss of appetite  Resp:   No shortness of breath with exertion or at rest. No excess mucus, no productive cough, No non-productive cough, No coughing up of blood.No change in color of mucus.No wheezing.No chest wall deformity  Skin:  no rash or lesions.  GU:  no dysuria, change in color of urine, no urgency or frequency. No flank pain.  Musculoskeletal:   No joint pain or swelling. No decreased range of motion. No back pain.  Psych:  No change in mood or affect. No depression or anxiety. No memory loss.  Neuro: Per HPI  All other systems were reviewed and are negative.  Past Medical History  Diagnosis Date  . Peripheral neuropathy ([place])   . PONV (postoperative nausea and vomiting)   . Rib fractures     IN THE PAST  . Osteoporosis   . Arthritis     OA AND PAIN LEFT HIP  . Headache(784.0)     HX OF MIGRAINES  . Hypertension   . Allergic urticaria   . Shingles 1940'S  . Glaucoma   . Hypercholesterolemia    Past Surgical History  Procedure Laterality Date  . Joint replacement  2011    RIGHT TOTAL HIP REPLACEMENT    . Tonsillectomy  1934  . Abdominal hysterectomy  1958  . Dilation and curettage of uterus      MULTIPLE  . Eye surgery  1998 & 2006    BILATERAL CATARACT EXTRACTION  . Appendectomy  1937  . External fixator left arm fracture 1997    . Total hip arthroplasty  05/27/2011    Procedure: TOTAL HIP ARTHROPLASTY;  Surgeon: Gearlean Alf, MD;  Location: [facility];  Service: Orthopedics;  Laterality: Left;   Social History:  reports that she has quit smoking. She does not have any smokeless tobacco history on file. She reports that she does not drink alcohol or use illicit drugs.  Allergies  Allergen Reactions  . Codeine Nausea And Vomiting  . Tylenol [Acetaminophen]     ITCHING  . Ibuprofen Itching and Rash    Family History  Problem Relation Age of Onset  . Heart disease Mother      Prior to Admission medications   Medication Sig Start Date End Date Taking? Authorizing Provider  Cholecalciferol (VITAMIN D3) 2000 UNITS TABS Take 2,000 Units by mouth daily.   Yes Historical Provider, MD  Cyanocobalamin (VITAMIN B-12 PO) Take 1 tablet by mouth daily.    Yes Historical Provider, MD  furosemide (LASIX) 20 MG tablet Take 20 mg by mouth daily as needed for fluid.    Yes Historical Provider, MD  metoprolol succinate (TOPROL-XL) 25 MG 24 hr tablet Take 12.5 mg by mouth  daily with breakfast.   Yes Historical Provider, MD  VITAMIN E PO Take 1 tablet by mouth daily.    Yes Historical Provider, MD   Physical Exam: Filed Vitals:   03/12/15 1100 03/12/15 1102 03/12/15 1130 03/12/15 1200  BP: 197/80 197/80 192/66 189/71  Pulse: 56 57 57 56  Temp:      TempSrc:      Resp: 14 14 11 13   Height:      Weight:      SpO2: 99% 100% 98% 100%    Wt Readings from Last 3 Encounters:  03/12/15 77.111 kg (170 lb)  05/27/11 78.019 kg (172 lb)  05/14/11 78.019 kg (172 lb)    General:  Appears calm and comfortable, [place]lty Eyes:  PERRL, EOMI, normal lids, iris ENT:  grossly normal hearing, lips &  tongue Neck:  no LAD, masses or thyromegaly Cardiovascular:  RRR, no m/r/g. No LE edema.  Respiratory: CTA bilaterally, no w/r/r. Normal respiratory effort. Abdomen:  soft, ntnd Skin:  no rash or induration seen on limited exam Musculoskeletal:  grossly normal tone BUE/BLE Psychiatric:  grossly normal mood and affect, speech fluent and appropriate Neurologic:  CN 2-12 grossly intact,sits unassisted,  moves all extremities in coordinated fashion.          Labs on Admission:  Basic Metabolic Panel:  Recent Labs Lab 03/12/15 1021 03/12/15 1031  NA 142 142  K 4.1 4.0  CL 106 103  CO2 27  --   GLUCOSE 103* 94  BUN 15 17  CREATININE 0.82 0.90  CALCIUM 9.2  --    Liver Function Tests:  Recent Labs Lab 03/12/15 1021  AST 24  ALT 14  ALKPHOS 93  BILITOT 0.6  PROT 6.5  ALBUMIN 4.0   No results for input(s): LIPASE, AMYLASE in the last 168 hours. No results for input(s): AMMONIA in the last 168 hours. CBC:  Recent Labs Lab 03/12/15 1021 03/12/15 1031  WBC 4.5  --   NEUTROABS 3.1  --   HGB 13.1 14.6  HCT 40.4 43.0  MCV 92.2  --   PLT 175  --    Cardiac Enzymes: No results for input(s): CKTOTAL, CKMB, CKMBINDEX, TROPONINI in the last 168 hours.  BNP (last 3 results) No results for input(s): BNP in the last 8760 hours.  ProBNP (last 3 results) No results for input(s): PROBNP in the last 8760 hours.   CREATININE: 0.9 (03/12/15 1031) Estimated creatinine clearance - 42.6 mL/min  CBG: No results for input(s): GLUCAP in the last 168 hours.  Radiological Exams on Admission: Ct Head Wo Contrast  03/12/2015  CLINICAL DATA:  Right-sided TIA. Left facial numbness, slurred speech. EXAM: CT HEAD WITHOUT CONTRAST TECHNIQUE: Contiguous axial images were obtained from the base of the skull through the vertex without intravenous contrast. COMPARISON:  None. FINDINGS: Mild cerebral atrophy. No acute intracranial abnormality. Specifically, no hemorrhage, hydrocephalus, mass  lesion, acute infarction, or significant intracranial injury. No acute calvarial abnormality. Visualized paranasal sinuses and mastoids clear. Orbital soft tissues unremarkable. IMPRESSION: No acute intracranial abnormality. Electronically Signed   By: Rolm Baptise M.D.   On: 03/12/2015 10:43      Assessment/Plan Active Problems:   Essential hypertension   TIA (transient ischemic attack)   Dependent edema       TIA: L facial and UE numbness and weakness. Lasted 15-30 min. ABCD score 5. Sx resolved. EDP consulted neuro who recommends TIA workup. CT head negative for acute process.  - Tele - MIR/MRA  head and neck - neuro checks - PT/OT - Echo - Speech eval as failed bedside swallow.  - start ASA - lipid, A1c  HTN:  Continue metop Hydralazine PRN  Dependent edema: lasix PRN. Currently no edema - continue lasix prn   Code Status: FULL  DVT Prophylaxis: Lovenox Family Communication: husband and son Disposition Plan: Pending Improvement    MERRELL, DAVID J, MD Family Medicine [facility] [email or web address] Password [email or web address]

## 2015-03-13 ENCOUNTER — Observation Stay (HOSPITAL_BASED_OUTPATIENT_CLINIC_OR_DEPARTMENT_OTHER): Payer: Medicare Other

## 2015-03-13 DIAGNOSIS — E785 Hyperlipidemia, unspecified: Secondary | ICD-10-CM | POA: Diagnosis not present

## 2015-03-13 DIAGNOSIS — G43909 Migraine, unspecified, not intractable, without status migrainosus: Secondary | ICD-10-CM | POA: Diagnosis not present

## 2015-03-13 DIAGNOSIS — I10 Essential (primary) hypertension: Secondary | ICD-10-CM | POA: Diagnosis not present

## 2015-03-13 DIAGNOSIS — G459 Transient cerebral ischemic attack, unspecified: Secondary | ICD-10-CM

## 2015-03-13 LAB — CBC
HEMATOCRIT: 42.1 % (ref 36.0–46.0)
HEMOGLOBIN: 13.7 g/dL (ref 12.0–15.0)
MCH: 29.9 pg (ref 26.0–34.0)
MCHC: 32.5 g/dL (ref 30.0–36.0)
MCV: 91.9 fL (ref 78.0–100.0)
Platelets: 180 10*3/uL (ref 150–400)
RBC: 4.58 MIL/uL (ref 3.87–5.11)
RDW: 14.4 % (ref 11.5–15.5)
WBC: 3.8 10*3/uL — ABNORMAL LOW (ref 4.0–10.5)

## 2015-03-13 LAB — COMPREHENSIVE METABOLIC PANEL
ALBUMIN: 3.8 g/dL (ref 3.5–5.0)
ALK PHOS: 93 U/L (ref 38–126)
ALT: 13 U/L — AB (ref 14–54)
ANION GAP: 8 (ref 5–15)
AST: 22 U/L (ref 15–41)
BILIRUBIN TOTAL: 0.4 mg/dL (ref 0.3–1.2)
BUN: 10 mg/dL (ref 6–20)
CALCIUM: 9 mg/dL (ref 8.9–10.3)
CO2: 25 mmol/L (ref 22–32)
CREATININE: 0.83 mg/dL (ref 0.44–1.00)
Chloride: 108 mmol/L (ref 101–111)
GFR calc non Af Amer: >60 mL/min (ref >60)
GLUCOSE: 133 mg/dL — AB (ref 65–99)
Potassium: 4.2 mmol/L (ref 3.5–5.1)
Sodium: 141 mmol/L (ref 135–145)
TOTAL PROTEIN: 6.5 g/dL (ref 6.5–8.1)

## 2015-03-13 MED ORDER — ASPIRIN EC 325 MG PO TBEC
325.0000 mg | DELAYED_RELEASE_TABLET | Freq: Every day | ORAL | Status: DC
  Administered 2015-03-14: 325 mg via ORAL
  Filled 2015-03-13: qty 1

## 2015-03-13 NOTE — Progress Notes (Signed)
PATIENT DETAILS Name: Maria [name] Age: 82 y.o. Sex: female Date of Birth: 1923-01-30 Admit Date: 03/12/2015 Admitting Physician Waldemar Dickens, MD [id number] Maudie Mercury, MD  Subjective: No further dysarthria or left arm numbness.  Assessment/Plan: Active Problems: TIA vs complicated migraine: Dysarthria and left arm numbness have completely resolved. MRI showed a small CVA vs artifact at the parietal occipital junction on the right-neurology does not feel that this is a CVA. MRA neck does not show any significant stenosis, 2-D echo pending. A1c and LDL pending. Continue aspirin. Await further studies and further recommendations from neurology.  Hypertension: Better controlled today-stop IV fluids, continue metoprolol and Lasix. Follow  Dyslipidemia: LDL pending, if not at goal will start statins  Migraine headaches: Headaches very infrequent-hold off on starting prophylactic medications at this point.  Disposition: Remain inpatient  Antimicrobial agents  See below  Anti-infectives    None      DVT Prophylaxis: Prophylactic Lovenox   Code Status: Full code  Family Communication Spouse at bedside  Procedures: None  CONSULTS:  neurology  Time spent 25 minutes-Greater than 50% of this time was spent in counseling, explanation of diagnosis, planning of further management, and coordination of care.  MEDICATIONS: Scheduled Meds: .  stroke: mapping our early stages of recovery book   Does not apply Once  . [START ON 03/14/2015] aspirin EC  325 mg Oral Daily  . enoxaparin (LOVENOX) injection  40 mg Subcutaneous Q24H  . metoprolol succinate  12.5 mg Oral Q breakfast  . sodium chloride flush  3 mL Intravenous Q12H  . sodium chloride flush  3 mL Intravenous Q12H   Continuous Infusions: . sodium chloride 75 mL/hr at 03/12/15 2334   PRN Meds:.sodium chloride, furosemide, hydrALAZINE, ondansetron **OR** ondansetron (ZOFRAN) IV, sodium chloride  flush    PHYSICAL EXAM: Vital signs in last 24 hours: Filed Vitals:   03/13/15 0400 03/13/15 0536 03/13/15 0924 03/13/15 1333  BP: 171/58 171/65 140/53 124/50  Pulse:  64 62 72  Temp: 97.5 F (36.4 C) 97.5 F (36.4 C) 97.7 F (36.5 C) 97.7 F (36.5 C)  TempSrc: Oral Oral Oral Oral  Resp: 16 16 16 20   Height:      Weight:      SpO2: 100% 96% 94% 98%    Weight change:  Filed Weights   03/12/15 1015  Weight: 77.111 kg (170 lb)   Body mass index is 28.29 kg/(m^2).   Gen Exam: Awake and alert with clear speech.   Neck: Supple, No JVD.   Chest: B/L Clear.   CVS: S1 S2 Regular, no murmurs.  Abdomen: soft, BS +, non tender, non distended.  Extremities: no edema, lower extremities warm to touch. Neurologic: Non Focal.   Skin: No Rash.   Wounds: N/A.   Intake/Output from previous day:  Intake/Output Summary (Last 24 hours) at 03/13/15 1503 Last data filed at 03/13/15 1334  Gross per 24 hour  Intake    360 ml  Output      0 ml  Net    360 ml     LAB RESULTS: CBC  Recent Labs Lab 03/12/15 1021 03/12/15 1031 03/13/15 0821  WBC 4.5  --  3.8*  HGB 13.1 14.6 13.7  HCT 40.4 43.0 42.1  PLT 175  --  180  MCV 92.2  --  91.9  MCH 29.9  --  29.9  MCHC 32.4  --  32.5  RDW 14.4  --  14.4  LYMPHSABS 1.0  --   --   MONOABS 0.3  --   --   EOSABS 0.1  --   --   BASOSABS 0.0  --   --     Chemistries   Recent Labs Lab 03/12/15 1021 03/12/15 1031 03/13/15 0821  NA 142 142 141  K 4.1 4.0 4.2  CL 106 103 108  CO2 27  --  25  GLUCOSE 103* 94 133*  BUN 15 17 10   CREATININE 0.82 0.90 0.83  CALCIUM 9.2  --  9.0    CBG:  Recent Labs Lab 03/12/15 1716  GLUCAP 99    GFR Estimated Creatinine Clearance: 46.2 mL/min (by C-G formula based on Cr of 0.83).  Coagulation profile  Recent Labs Lab 03/12/15 1021  INR 1.06    Cardiac Enzymes No results for input(s): CKMB, TROPONINI, MYOGLOBIN in the last 168 hours.  Invalid input(s): CK  Invalid input(s):  POCBNP No results for input(s): DDIMER in the last 72 hours. No results for input(s): HGBA1C in the last 72 hours. No results for input(s): CHOL, HDL, LDLCALC, TRIG, CHOLHDL, LDLDIRECT in the last 72 hours. No results for input(s): TSH, T4TOTAL, T3FREE, THYROIDAB in the last 72 hours.  Invalid input(s): FREET3 No results for input(s): VITAMINB12, FOLATE, FERRITIN, TIBC, IRON, RETICCTPCT in the last 72 hours. No results for input(s): LIPASE, AMYLASE in the last 72 hours.  Urine Studies No results for input(s): UHGB, CRYS in the last 72 hours.  Invalid input(s): UACOL, UAPR, USPG, UPH, UTP, UGL, UKET, UBIL, UNIT, UROB, ULEU, UEPI, UWBC, URBC, UBAC, CAST, UCOM, BILUA  MICROBIOLOGY: No results found for this or any previous visit (from the past 240 hour(s)).  RADIOLOGY STUDIES/RESULTS: Ct Head Wo Contrast  03/12/2015  CLINICAL DATA:  Right-sided TIA. Left facial numbness, slurred speech. EXAM: CT HEAD WITHOUT CONTRAST TECHNIQUE: Contiguous axial images were obtained from the base of the skull through the vertex without intravenous contrast. COMPARISON:  None. FINDINGS: Mild cerebral atrophy. No acute intracranial abnormality. Specifically, no hemorrhage, hydrocephalus, mass lesion, acute infarction, or significant intracranial injury. No acute calvarial abnormality. Visualized paranasal sinuses and mastoids clear. Orbital soft tissues unremarkable. IMPRESSION: No acute intracranial abnormality. Electronically Signed   By: Rolm Baptise M.D.   On: 03/12/2015 10:43   Mr Virgel Paling Wo Contrast  03/12/2015  CLINICAL DATA:  82 year old hypertensive female with acute onset of left-sided numbness and weakness (face and left upper extremity) with associated difficulty speaking. Presently no complaints. Subsequent encounter. EXAM: MRI HEAD WITHOUT AND WITH CONTRAST MRA HEAD WITHOUT CONTRAST MRA NECK WITHOUT AND WITH CONTRAST TECHNIQUE: Multiplanar, multiecho pulse sequences of the brain and surrounding  structures were obtained without and with intravenous contrast. Angiographic images of the Circle of Willis were obtained using MRA technique without intravenous contrast. Angiographic images of the neck were obtained using MRA technique without and with intravenous contrast. Carotid stenosis measurements (when applicable) are obtained utilizing NASCET criteria, using the distal internal carotid diameter as the denominator. CONTRAST:  85mL MULTIHANCE GADOBENATE DIMEGLUMINE 529 MG/ML IV SOLN COMPARISON:  03/12/2015 head CT. FINDINGS: MRI HEAD FINDINGS Tiny acute infarct versus artifact peripheral aspect right parietal-occipital lobe junction. Otherwise no evidence of acute infarct. Remote small infarct occipital lobes. Mild to moderate chronic microvascular ischemic changes. No intracranial hemorrhage. No intracranial mass or abnormal enhancement. Global moderate atrophy without hydrocephalus. Post lens replacement otherwise orbital structures unremarkable. Cervical medullary junction pituitary region unremarkable. MRA HEAD FINDINGS Mild to  moderate narrowing anterior aspect left internal carotid artery cavernous segment. Mild narrowing supraclinoid segment of the internal carotid artery bilaterally. Minimal bulge on left consistent with infundibulum rather than aneurysm. Mild to moderate focal stenosis junction A1 and A2 segment left anterior cerebral artery. Middle cerebral artery branch vessel mild to moderate irregularity narrowing with decrease number of visualized right middle cerebral are branches compared to the left. No significant stenosis of the M1 segment or carotid terminus on either side. Ectatic vertebral arteries with mild narrowing on the right. Nonvisualized right posterior inferior cerebellar artery and left anterior inferior cerebellar artery. No high-grade stenosis of the basilar artery. High-grade stenosis proximal P2 segment right posterior cerebral artery. Moderate narrowing distal P2 segment  left posterior cerebral artery. Posterior cerebral artery distal branch vessel narrowing and irregularity bilaterally. MRA NECK FINDINGS Three vessel aortic arch. Ectatic right common carotid artery without significant narrowing. Plaque with narrowing proximal right internal carotid artery (less than 50%). Ectasia of the right internal carotid artery beyond this region with slight irregularity. Ectatic left internal carotid artery without significant stenosis. Mild moderate narrowing proximal aspect of the vertebral arteries bilaterally. Proximal 3 cm of the vertebral arteries with significant ectasia bilaterally. Mild narrowing proximal subclavian arteries bilaterally. IMPRESSION: MRI HEAD Tiny acute infarct versus artifact peripheral aspect right parietal-occipital lobe junction. Otherwise no evidence of acute infarct. Remote small infarct occipital lobes. Mild to moderate chronic microvascular ischemic changes. No intracranial hemorrhage. No intracranial mass or abnormal enhancement. Global moderate atrophy without hydrocephalus. MRA HEAD Mild to moderate narrowing anterior aspect left internal carotid artery cavernous segment. Mild narrowing supraclinoid segment of the internal carotid artery bilaterally. Mild to moderate focal stenosis junction A1 and A2 segment left anterior cerebral artery. Middle cerebral artery branch vessel mild to moderate irregularity narrowing with decrease number of visualized right middle cerebral are branches compared to the left. No significant stenosis of the M1 segment or carotid terminus on either side. Ectatic vertebral arteries with mild narrowing on the right. Nonvisualized right posterior inferior cerebellar artery and left anterior inferior cerebellar artery. No high-grade stenosis of the basilar artery. High-grade stenosis proximal P2 segment right posterior cerebral artery. Moderate narrowing distal P2 segment left posterior cerebral artery. Posterior cerebral artery  distal branch vessel narrowing and irregularity bilaterally. MRA NECK Plaque with narrowing proximal right internal carotid artery (less than 50%). Ectasia of the right internal carotid artery beyond this region with slight irregularity. Ectatic left internal carotid artery without significant stenosis. Mild to moderate narrowing proximal aspect of the vertebral arteries bilaterally. Mild narrowing proximal subclavian arteries bilaterally. Electronically Signed   By: Genia Del M.D.   On: 03/12/2015 14:29   Mr Angiogram Neck W Wo Contrast  03/12/2015  CLINICAL DATA:  82 year old hypertensive female with acute onset of left-sided numbness and weakness (face and left upper extremity) with associated difficulty speaking. Presently no complaints. Subsequent encounter. EXAM: MRI HEAD WITHOUT AND WITH CONTRAST MRA HEAD WITHOUT CONTRAST MRA NECK WITHOUT AND WITH CONTRAST TECHNIQUE: Multiplanar, multiecho pulse sequences of the brain and surrounding structures were obtained without and with intravenous contrast. Angiographic images of the Circle of Willis were obtained using MRA technique without intravenous contrast. Angiographic images of the neck were obtained using MRA technique without and with intravenous contrast. Carotid stenosis measurements (when applicable) are obtained utilizing NASCET criteria, using the distal internal carotid diameter as the denominator. CONTRAST:  72mL MULTIHANCE GADOBENATE DIMEGLUMINE 529 MG/ML IV SOLN COMPARISON:  03/12/2015 head CT. FINDINGS: MRI HEAD FINDINGS Tiny acute infarct  versus artifact peripheral aspect right parietal-occipital lobe junction. Otherwise no evidence of acute infarct. Remote small infarct occipital lobes. Mild to moderate chronic microvascular ischemic changes. No intracranial hemorrhage. No intracranial mass or abnormal enhancement. Global moderate atrophy without hydrocephalus. Post lens replacement otherwise orbital structures unremarkable. Cervical  medullary junction pituitary region unremarkable. MRA HEAD FINDINGS Mild to moderate narrowing anterior aspect left internal carotid artery cavernous segment. Mild narrowing supraclinoid segment of the internal carotid artery bilaterally. Minimal bulge on left consistent with infundibulum rather than aneurysm. Mild to moderate focal stenosis junction A1 and A2 segment left anterior cerebral artery. Middle cerebral artery branch vessel mild to moderate irregularity narrowing with decrease number of visualized right middle cerebral are branches compared to the left. No significant stenosis of the M1 segment or carotid terminus on either side. Ectatic vertebral arteries with mild narrowing on the right. Nonvisualized right posterior inferior cerebellar artery and left anterior inferior cerebellar artery. No high-grade stenosis of the basilar artery. High-grade stenosis proximal P2 segment right posterior cerebral artery. Moderate narrowing distal P2 segment left posterior cerebral artery. Posterior cerebral artery distal branch vessel narrowing and irregularity bilaterally. MRA NECK FINDINGS Three vessel aortic arch. Ectatic right common carotid artery without significant narrowing. Plaque with narrowing proximal right internal carotid artery (less than 50%). Ectasia of the right internal carotid artery beyond this region with slight irregularity. Ectatic left internal carotid artery without significant stenosis. Mild moderate narrowing proximal aspect of the vertebral arteries bilaterally. Proximal 3 cm of the vertebral arteries with significant ectasia bilaterally. Mild narrowing proximal subclavian arteries bilaterally. IMPRESSION: MRI HEAD Tiny acute infarct versus artifact peripheral aspect right parietal-occipital lobe junction. Otherwise no evidence of acute infarct. Remote small infarct occipital lobes. Mild to moderate chronic microvascular ischemic changes. No intracranial hemorrhage. No intracranial mass or  abnormal enhancement. Global moderate atrophy without hydrocephalus. MRA HEAD Mild to moderate narrowing anterior aspect left internal carotid artery cavernous segment. Mild narrowing supraclinoid segment of the internal carotid artery bilaterally. Mild to moderate focal stenosis junction A1 and A2 segment left anterior cerebral artery. Middle cerebral artery branch vessel mild to moderate irregularity narrowing with decrease number of visualized right middle cerebral are branches compared to the left. No significant stenosis of the M1 segment or carotid terminus on either side. Ectatic vertebral arteries with mild narrowing on the right. Nonvisualized right posterior inferior cerebellar artery and left anterior inferior cerebellar artery. No high-grade stenosis of the basilar artery. High-grade stenosis proximal P2 segment right posterior cerebral artery. Moderate narrowing distal P2 segment left posterior cerebral artery. Posterior cerebral artery distal branch vessel narrowing and irregularity bilaterally. MRA NECK Plaque with narrowing proximal right internal carotid artery (less than 50%). Ectasia of the right internal carotid artery beyond this region with slight irregularity. Ectatic left internal carotid artery without significant stenosis. Mild to moderate narrowing proximal aspect of the vertebral arteries bilaterally. Mild narrowing proximal subclavian arteries bilaterally. Electronically Signed   By: Genia Del M.D.   On: 03/12/2015 14:29   Mr Jeri Cos [id number] Contrast  03/12/2015  CLINICAL DATA:  82 year old hypertensive female with acute onset of left-sided numbness and weakness (face and left upper extremity) with associated difficulty speaking. Presently no complaints. Subsequent encounter. EXAM: MRI HEAD WITHOUT AND WITH CONTRAST MRA HEAD WITHOUT CONTRAST MRA NECK WITHOUT AND WITH CONTRAST TECHNIQUE: Multiplanar, multiecho pulse sequences of the brain and surrounding structures were obtained without  and with intravenous contrast. Angiographic images of the Circle of Willis were obtained using MRA technique without  intravenous contrast. Angiographic images of the neck were obtained using MRA technique without and with intravenous contrast. Carotid stenosis measurements (when applicable) are obtained utilizing NASCET criteria, using the distal internal carotid diameter as the denominator. CONTRAST:  43mL MULTIHANCE GADOBENATE DIMEGLUMINE 529 MG/ML IV SOLN COMPARISON:  03/12/2015 head CT. FINDINGS: MRI HEAD FINDINGS Tiny acute infarct versus artifact peripheral aspect right parietal-occipital lobe junction. Otherwise no evidence of acute infarct. Remote small infarct occipital lobes. Mild to moderate chronic microvascular ischemic changes. No intracranial hemorrhage. No intracranial mass or abnormal enhancement. Global moderate atrophy without hydrocephalus. Post lens replacement otherwise orbital structures unremarkable. Cervical medullary junction pituitary region unremarkable. MRA HEAD FINDINGS Mild to moderate narrowing anterior aspect left internal carotid artery cavernous segment. Mild narrowing supraclinoid segment of the internal carotid artery bilaterally. Minimal bulge on left consistent with infundibulum rather than aneurysm. Mild to moderate focal stenosis junction A1 and A2 segment left anterior cerebral artery. Middle cerebral artery branch vessel mild to moderate irregularity narrowing with decrease number of visualized right middle cerebral are branches compared to the left. No significant stenosis of the M1 segment or carotid terminus on either side. Ectatic vertebral arteries with mild narrowing on the right. Nonvisualized right posterior inferior cerebellar artery and left anterior inferior cerebellar artery. No high-grade stenosis of the basilar artery. High-grade stenosis proximal P2 segment right posterior cerebral artery. Moderate narrowing distal P2 segment left posterior cerebral artery.  Posterior cerebral artery distal branch vessel narrowing and irregularity bilaterally. MRA NECK FINDINGS Three vessel aortic arch. Ectatic right common carotid artery without significant narrowing. Plaque with narrowing proximal right internal carotid artery (less than 50%). Ectasia of the right internal carotid artery beyond this region with slight irregularity. Ectatic left internal carotid artery without significant stenosis. Mild moderate narrowing proximal aspect of the vertebral arteries bilaterally. Proximal 3 cm of the vertebral arteries with significant ectasia bilaterally. Mild narrowing proximal subclavian arteries bilaterally. IMPRESSION: MRI HEAD Tiny acute infarct versus artifact peripheral aspect right parietal-occipital lobe junction. Otherwise no evidence of acute infarct. Remote small infarct occipital lobes. Mild to moderate chronic microvascular ischemic changes. No intracranial hemorrhage. No intracranial mass or abnormal enhancement. Global moderate atrophy without hydrocephalus. MRA HEAD Mild to moderate narrowing anterior aspect left internal carotid artery cavernous segment. Mild narrowing supraclinoid segment of the internal carotid artery bilaterally. Mild to moderate focal stenosis junction A1 and A2 segment left anterior cerebral artery. Middle cerebral artery branch vessel mild to moderate irregularity narrowing with decrease number of visualized right middle cerebral are branches compared to the left. No significant stenosis of the M1 segment or carotid terminus on either side. Ectatic vertebral arteries with mild narrowing on the right. Nonvisualized right posterior inferior cerebellar artery and left anterior inferior cerebellar artery. No high-grade stenosis of the basilar artery. High-grade stenosis proximal P2 segment right posterior cerebral artery. Moderate narrowing distal P2 segment left posterior cerebral artery. Posterior cerebral artery distal branch vessel narrowing and  irregularity bilaterally. MRA NECK Plaque with narrowing proximal right internal carotid artery (less than 50%). Ectasia of the right internal carotid artery beyond this region with slight irregularity. Ectatic left internal carotid artery without significant stenosis. Mild to moderate narrowing proximal aspect of the vertebral arteries bilaterally. Mild narrowing proximal subclavian arteries bilaterally. Electronically Signed   By: Genia Del M.D.   On: 03/12/2015 14:29    Oren Binet, MD  [facility] Pager:[phone]  If 7PM-7AM, please contact night-coverage [email or web address] Password [email or web address] 03/13/2015, 3:03 PM

## 2015-03-13 NOTE — Care Management Note (Signed)
Case Management Note  Patient Details  Name: Maria [name] MRN: [id number] Date of Birth: 1922-03-20  Subjective/Objective:                    Action/Plan: Patient was hospitalized for complicated migraine vs TIA. Lives at home with spouse. Will follow for discharge needs pending PT/OT evals and physician orders.  Expected Discharge Date:                  Expected Discharge Plan:     In-House Referral:     Discharge planning Services     Post Acute Care Choice:    Choice offered to:     DME Arranged:    DME Agency:     HH Arranged:    HH Agency:     Status of Service:  In process, will continue to follow  Medicare Important Message Given:    Date Medicare IM Given:    Medicare IM give by:    Date Additional Medicare IM Given:    Additional Medicare Important Message give by:     If discussed at [place] of Stay Meetings, dates discussed:    Additional Comments:  Rolm Baptise, RN 03/13/2015, 11:43 AM [phone]

## 2015-03-13 NOTE — Progress Notes (Signed)
  Echocardiogram 2D Echocardiogram has been performed.  Darlina Sicilian M 03/13/2015, 4:21 PM

## 2015-03-13 NOTE — Evaluation (Signed)
Physical Therapy Evaluation and Discharge Patient Details Name: Maria [name] MRN: [id number] DOB: 04-08-1922 Today's Date: 03/13/2015   History of Present Illness  Pt is a 82 y.o. female presenting with L sided numbness and weakness involving face adn LUE. Associated w/ difficulty speaking. Called EMS and sx resolved within 15-30 min. PMH: Peripheral neuropathy, HTN, Glaucoma, Hypercholesterolemia.     Clinical Impression  Patient evaluated by Physical Therapy with no further acute PT needs identified. All education has been completed and the patient has no further questions. Demonstrates minor instability with gait, exacerbated by higher level dynamic gait tasks. Reports she feels that she is back to her baseline. Supportive husband. Encouraged to follow-up with outpatient PT for balance training. See below for any follow-up Physical Therapy or equipment needs. PT is signing off. Thank you for this referral.     Follow Up Recommendations Outpatient PT    Equipment Recommendations  None recommended by PT    Recommendations for Other Services       Precautions / Restrictions Precautions Precautions: Fall Restrictions Weight Bearing Restrictions: No      Mobility  Bed Mobility               General bed mobility comments: Pt OOB in chair.  Transfers Overall transfer level: Needs assistance Equipment used: None Transfers: Sit to/from Stand Sit to Stand: Supervision         General transfer comment: supervision for safety. Fairly stable without assistive device, minimal sway performed from recliner.  Ambulation/Gait Ambulation/Gait assistance: Min guard Ambulation Distance (Feet): 200 Feet Assistive device: None Gait Pattern/deviations: Step-through pattern;Decreased step length - right;Decreased stance time - left;Decreased stride length Gait velocity: decreased Gait velocity interpretation: Below normal speed for age/gender General Gait Details: Some  difficulty with more dynamic gait tasks including vertical head turns and quick turns. Able to self correct with loss of balance during these tasks. Performed high marching, variable speeds, and backwards stepping without significant difficulty. Cues for safety and awareness.  Stairs Stairs: Yes Stairs assistance: Supervision Stair Management: One rail Left;Alternating pattern;Step to pattern;Sideways;Forwards Number of Stairs: 4 (x2) General stair comments: Practiced with single hand and bil hand on hand rail, step-to and alternating approach. Pt more steady with sideways approach, both hands on rail. Did not require physical assistance.  Wheelchair Mobility    Modified Rankin (Stroke Patients Only) Modified Rankin (Stroke Patients Only) Pre-Morbid Rankin Score: No significant disability Modified Rankin: Moderately severe disability     Balance                                             Pertinent Vitals/Pain      Home Living Family/patient expects to be discharged to:: Private residence Living Arrangements: Spouse/significant other Available Help at Discharge: Family;Available 24 hours/day Type of Home: House Home Access: Stairs to enter Entrance Stairs-Rails: Can reach both Entrance Stairs-Number of Steps: 3-4 Home Layout: One level Home Equipment: Walker - 2 wheels;Cane - single point;Bedside commode      Prior Function Level of Independence: Independent with assistive device(s)         Comments: Pt reports she used cane for mobility in the community, no AD for mobility in home. Independent with all ADLs/IADLs PTA.     Hand Dominance   Dominant Hand: Right    Extremity/Trunk Assessment   Upper Extremity Assessment: Defer to OT  evaluation           Lower Extremity Assessment: Generalized weakness      Cervical / Trunk Assessment: Kyphotic  Communication   Communication: HOH  Cognition Arousal/Alertness: Awake/alert Behavior  During Therapy: WFL for tasks assessed/performed Overall Cognitive Status: Within Functional Limits for tasks assessed                      General Comments General comments (skin integrity, edema, etc.): husband present and supportive. Reviewed FAST acronym for stroke.    Exercises        Assessment/Plan    PT Assessment Patent does not need any further PT services  PT Diagnosis Abnormality of gait   PT Problem List Decreased strength;Decreased balance;Decreased mobility  PT Treatment Interventions     PT Goals (Current goals can be found in the Care Plan section) Acute Rehab PT Goals Patient Stated Goal: to go home today PT Goal Formulation: All assessment and education complete, DC therapy    Frequency     Barriers to discharge        Co-evaluation               End of Session   Activity Tolerance: Patient tolerated treatment well Patient left: in chair;with call bell/phone within reach;with family/visitor present Nurse Communication: Mobility status    Functional Assessment Tool Used: clinical observation Functional Limitation: Mobility: Walking and moving around Mobility: Walking and Moving Around Current Status [id number]): At least 1 percent but less than 20 percent impaired, limited or restricted Mobility: Walking and Moving Around Goal Status [phone]): At least 1 percent but less than 20 percent impaired, limited or restricted Mobility: Walking and Moving Around Discharge Status [phone]): At least 1 percent but less than 20 percent impaired, limited or restricted    Time: [id number] PT Time Calculation (min) (ACUTE ONLY): 20 min   Charges:   PT Evaluation $PT Eval Low Complexity: 1 Procedure     PT G Codes:   PT G-Codes **NOT FOR INPATIENT CLASS** Functional Assessment Tool Used: clinical observation Functional Limitation: Mobility: Walking and moving around Mobility: Walking and Moving Around Current Status [id number]): At least 1 percent but less  than 20 percent impaired, limited or restricted Mobility: Walking and Moving Around Goal Status [phone]): At least 1 percent but less than 20 percent impaired, limited or restricted Mobility: Walking and Moving Around Discharge Status [phone]): At least 1 percent but less than 20 percent impaired, limited or restricted    Ellouise Newer 03/13/2015, 3:12 PM  Camille Bal [place], [place]

## 2015-03-13 NOTE — Progress Notes (Signed)
STROKE TEAM PROGRESS NOTE   HISTORY OF PRESENT ILLNESS Maria [name] is an 82 y.o. female woke up this AM 03/12/2015 at 0600 feeling normal. At 0615 [facility]) she noted her left arm was tingling and her voice was slurred. She also felt as if something was in her throat. On arrival to ED her symptoms had resolved with exception she still felt her throat had something in it. She had no difficulty with her secretions. NIHSS 0. Patient was not administered TPA secondary to symptoms resolved. She was admitted for further evaluation and treatment.   SUBJECTIVE (INTERVAL HISTORY) Her husband is at the bedside.  She is up in the chair. She recounted her HPI with me. She had sudden onset left finger numbness, went up to arm and then shoulder in sequence in the process of 30 min, also she had slurry speech. Feels whole episode lasted a couple of hours (until arrived in ER). Has hx of migraines, has them occasionally, typically last 1/2 day, last one occurred last month. Her vision gets "out of wack" - sees "heat waves". Every few months or so. Complains of HA in frontal location. Does not treat with medication, resolves on its own. Never had numbness or speech problems before. Overall she feels her condition is completely resolved.    OBJECTIVE Temp:  [97.5 F (36.4 C)-97.8 F (36.6 C)] 97.7 F (36.5 C) (02/01 0924) Pulse Rate:  [50-64] 62 (02/01 0924) Cardiac Rhythm:  [-] Heart block (02/01 0743) Resp:  [11-20] 16 (02/01 0924) BP: (140-197)/(53-140) 140/53 mmHg (02/01 0924) SpO2:  [94 %-100 %] 94 % (02/01 0924)  CBC:   Recent Labs Lab 03/12/15 1021 03/12/15 1031 03/13/15 0821  WBC 4.5  --  3.8*  NEUTROABS 3.1  --   --   HGB 13.1 14.6 13.7  HCT 40.4 43.0 42.1  MCV 92.2  --  91.9  PLT 175  --  [id number]    Basic Metabolic Panel:   Recent Labs Lab 03/12/15 1021 03/12/15 1031 03/13/15 0821  NA 142 142 141  K 4.1 4.0 4.2  CL 106 103 108  CO2 27  --  25  GLUCOSE 103* 94 133*  BUN 15  17 10   CREATININE 0.82 0.90 0.83  CALCIUM 9.2  --  9.0    Lipid Panel: No results found for: CHOL, TRIG, HDL, CHOLHDL, VLDL, LDLCALC HgbA1c: No results found for: HGBA1C Urine Drug Screen: No results found for: LABOPIA, COCAINSCRNUR, LABBENZ, AMPHETMU, THCU, LABBARB    IMAGING I have personally reviewed the radiological images below and agree with the radiology interpretations. Blue text is my interpretation.  Ct Head Wo Contrast 03/12/2015   No acute intracranial abnormality.   MRI HEAD  03/12/2015   Tiny acute infarct versus artifact peripheral aspect right parietal-occipital lobe junction. Otherwise no evidence of acute infarct. Remote small infarct occipital lobes. Mild to moderate chronic microvascular ischemic changes. No intracranial hemorrhage. No intracranial mass or abnormal enhancement. Global moderate atrophy without hydrocephalus. I would favor the peripheral artifact.   MRA HEAD  03/12/2015   Mild to moderate narrowing anterior aspect left internal carotid artery cavernous segment. Mild narrowing supraclinoid segment of the internal carotid artery bilaterally. Mild to moderate focal stenosis junction A1 and A2 segment left anterior cerebral artery. Middle cerebral artery branch vessel mild to moderate irregularity narrowing with decrease number of visualized right middle cerebral are branches compared to the left. No significant stenosis of the M1 segment or carotid terminus on either side. Ectatic vertebral  arteries with mild narrowing on the right. Nonvisualized right posterior inferior cerebellar artery and left anterior inferior cerebellar artery. No high-grade stenosis of the basilar artery. High-grade stenosis proximal P2 segment right posterior cerebral artery. Moderate narrowing distal P2 segment left posterior cerebral artery. Posterior cerebral artery distal branch vessel narrowing and irregularity bilaterally.   MRA NECK  03/12/2015   Plaque with narrowing proximal  right internal carotid artery (less than 50%). Ectasia of the right internal carotid artery beyond this region with slight irregularity. Ectatic left internal carotid artery without significant stenosis. Mild to moderate narrowing proximal aspect of the vertebral arteries bilaterally. Mild narrowing proximal subclavian arteries bilaterally.   2D echo - pending   Physical exam  Temp:  [97.5 F (36.4 C)-97.8 F (36.6 C)] 97.7 F (36.5 C) (02/01 1333) Pulse Rate:  [50-72] 72 (02/01 1333) Resp:  [14-20] 20 (02/01 1333) BP: (124-171)/(50-72) 124/50 mmHg (02/01 1333) SpO2:  [94 %-100 %] 98 % (02/01 1333)  General - Well nourished, well developed, in no apparent distress.  Ophthalmologic - Fundi not visualized due to eye movement.  Cardiovascular - Regular rate and rhythm.  Mental Status -  Level of arousal and orientation to time, place, and person were intact. Language including expression, naming, repetition, comprehension was assessed and found intact. Fund of Knowledge was assessed and was intact.  Cranial Nerves II - XII - II - Visual field intact OU. III, IV, VI - Extraocular movements intact. V - Facial sensation intact bilaterally. VII - Facial movement intact bilaterally. VIII - Hearing & vestibular intact bilaterally. X - Palate elevates symmetrically. XI - Chin turning & shoulder shrug intact bilaterally. XII - Tongue protrusion intact.  Motor Strength - The patient's strength was normal in all extremities and pronator drift was absent.  Bulk was normal and fasciculations were absent.   Motor Tone - Muscle tone was assessed at the neck and appendages and was normal.  Reflexes - The patient's reflexes were 1+ in all extremities and she had no pathological reflexes.  Sensory - Light touch, temperature/pinprick were assessed and were symmetrical.    Coordination - The patient had normal movements in the hands and feet with no ataxia or dysmetria.  Tremor was  absent.  Gait and Station - deferred to PT due to safety concerns.   ASSESSMENT/PLAN Maria [name] is a 82 y.o. female with history of hypertension, hyper lipidemia, glaucoma, migraines, arthritis, peripheral neuropathy presenting with left arm tingling and slurred speech. She did not receive IV t-PA due to symptoms resolved.   Complicated migraine most likely, but cannot fully rule out TIA  Resultant  Neurologic symptoms resolved  MRI  likely artifact R parietal-occipital junction (do not feel pt had stroke based on hx). 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   MRA head high grade stenosis R P2, mod L P2  MRA neck no significant stenosis  2D Echo  pending   LDL pending   HgbA1c pending  Lovenox 40 mg sq daily for VTE prophylaxis Diet Heart Room service appropriate?: Yes; Fluid consistency:: Thin  No antithrombotic prior to admission, now on aspirin 81 mg daily. Aspirin dose increased to 325 mg for stroke prevention. Continue ASA on discharge.  Patient counseled to be compliant with her antithrombotic medications  Ongoing aggressive stroke risk factor management  Therapy recommendations:  No OT, no ST  Disposition:  Return home with husband  Hypertension  Elevated on arrival at 206/93  Stable/improved  today  Hyperlipidemia  Home meds:  NO STATIN  LDL pending, goal < 100  Other Stroke Risk Factors  Advanced age  Former Cigarette smoker  Migraines - short lasting, occurs every 1-2 months, no medication needed as per pt  Hospital day # 1  Rosalin Hawking, MD PhD Stroke Neurology 03/13/2015 4:52 PM   To contact Stroke Continuity provider, please refer to [email or web address] After hours, contact General Neurology

## 2015-03-13 NOTE — Evaluation (Signed)
Occupational Therapy Evaluation and Discharge Patient Details Name: Maria [name] MRN: [id number] DOB: July 30, 1922 Today's Date: 03/13/2015    History of Present Illness Pt is a 82 y.o. female presenting with L sided numbness and weakness involving face adn LUE. Associated w/ difficulty speaking. Called EMS and sx resolved within 15-30 min. PMH: Peripheral neuropathy, HTN, Glaucoma, Hypercholesterolemia.      Clinical Impression   Pt reports she was independent with ADLs, used a cane for mobility outside of the home and no AD for mobility inside of the home PTA. Currently pt is overall supervision for safety with ADLs and functional mobility. Pt planning to d/c home with 24/7 supervision from her husband. Pt reports she currently feels at her baseline level with ADLs and functional mobility. Pt ready to d/c from OT standpoint; signing off at this time. Thank you for this referral.     Follow Up Recommendations  No OT follow up;Supervision - Intermittent    Equipment Recommendations  None recommended by OT    Recommendations for Other Services PT consult     Precautions / Restrictions Precautions Precautions: Fall Restrictions Weight Bearing Restrictions: No      Mobility Bed Mobility               General bed mobility comments: Pt OOB in chair.  Transfers Overall transfer level: Needs assistance Equipment used: None Transfers: Sit to/from Stand Sit to Stand: Supervision         General transfer comment: Supervision for safety. Sit to stand from chair x 1, toilet x 1.    Balance Overall balance assessment: Needs assistance         Standing balance support: No upper extremity supported Standing balance-Leahy Scale: Good                              ADL Overall ADL's : At baseline;Needs assistance/impaired     Grooming: Supervision/safety;Standing               Lower Body Dressing: Supervision/safety;Sit to/from stand Lower Body  Dressing Details (indicate cue type and reason): Supervision for safety with sit to stand. Pt able to don socks independently. Toilet Transfer: Supervision/safety;Ambulation;BSC (BSC over toilet)   Toileting- Clothing Manipulation and Hygiene: Supervision/safety;Sit to/from stand       Functional mobility during ADLs: Supervision/safety General ADL Comments: Pts husband present for OT eval. Pt currenlty overall supervision for safety with all ADLs and fucntional mobility. Pt reports she feels back at baseline level. Educated on home safety; pt and husband verbalize understanding.     Vision     Perception     Praxis      Pertinent Vitals/Pain Pain Assessment: No/denies pain     Hand Dominance Right   Extremity/Trunk Assessment Upper Extremity Assessment Upper Extremity Assessment: Overall WFL for tasks assessed   Lower Extremity Assessment Lower Extremity Assessment: Defer to PT evaluation   Cervical / Trunk Assessment Cervical / Trunk Assessment: Kyphotic   Communication Communication Communication: HOH   Cognition Arousal/Alertness: Awake/alert Behavior During Therapy: WFL for tasks assessed/performed Overall Cognitive Status: Within Functional Limits for tasks assessed                     General Comments       Exercises       Shoulder Instructions      Home Living Family/patient expects to be discharged to:: Private residence Living Arrangements: Spouse/significant  other Available Help at Discharge: Family;Available 24 hours/day Type of Home: House Home Access: Stairs to enter [employer] of Steps: 3-4 Entrance Stairs-Rails: Can reach both Home Layout: One level     Bathroom Shower/Tub: Tub/shower unit;Curtain Shower/tub characteristics: Architectural technologist: Handicapped height     Home Equipment: Environmental consultant - 2 wheels;Cane - single point;Bedside commode          Prior Functioning/Environment Level of Independence:  Independent with assistive device(s)        Comments: Pt reports she used cane for mobility in the community, no AD for mobility in home. Independent with all ADLs/IADLs PTA.    OT Diagnosis: Generalized weakness   OT Problem List:     OT Treatment/Interventions:      OT Goals(Current goals can be found in the care plan section) Acute Rehab OT Goals Patient Stated Goal: to go home today OT Goal Formulation: All assessment and education complete, DC therapy  OT Frequency:     Barriers to D/C:            Co-evaluation              End of Session Equipment Utilized During Treatment: Gait belt  Activity Tolerance: Patient tolerated treatment well Patient left: in chair;with call bell/phone within reach;with family/visitor present   Time: [id number] OT Time Calculation (min): 14 min Charges:  OT General Charges $OT Visit: 1 Procedure OT Evaluation $OT Eval Moderate Complexity: 1 Procedure G-Codes: OT G-codes **NOT FOR INPATIENT CLASS** Functional Assessment Tool Used: Clinical judgement Functional Limitation: Self care Self Care Current Status [id number]): At least 1 percent but less than 20 percent impaired, limited or restricted Self Care Goal Status [id number]): At least 1 percent but less than 20 percent impaired, limited or restricted Self Care Discharge Status [phone]): At least 1 percent but less than 20 percent impaired, limited or restricted   Binnie Kand M.S., OTR/L Pager: [phone]  03/13/2015, 10:36 AM

## 2015-03-14 DIAGNOSIS — E785 Hyperlipidemia, unspecified: Secondary | ICD-10-CM

## 2015-03-14 DIAGNOSIS — G43909 Migraine, unspecified, not intractable, without status migrainosus: Secondary | ICD-10-CM | POA: Diagnosis not present

## 2015-03-14 DIAGNOSIS — I10 Essential (primary) hypertension: Secondary | ICD-10-CM | POA: Diagnosis not present

## 2015-03-14 LAB — LIPID PANEL
Cholesterol: 173 mg/dL (ref 0–200)
HDL: 45 mg/dL (ref >40)
LDL CALC: 106 mg/dL — AB (ref 0–99)
Total CHOL/HDL Ratio: 3.8 RATIO
Triglycerides: 111 mg/dL (ref <150)
VLDL: 22 mg/dL (ref 0–40)

## 2015-03-14 LAB — HEMOGLOBIN A1C
HEMOGLOBIN A1C: 5.9 % — AB (ref 4.8–5.6)
Mean Plasma Glucose: 123 mg/dL

## 2015-03-14 MED ORDER — NONFORMULARY OR COMPOUNDED ITEM: Status: DC

## 2015-03-14 MED ORDER — ASPIRIN 325 MG PO TBEC: 325.0000 mg | DELAYED_RELEASE_TABLET | Freq: Every day | ORAL | Status: DC

## 2015-03-14 MED ORDER — METOPROLOL SUCCINATE ER 25 MG PO TB24: 25.0000 mg | ORAL_TABLET | Freq: Every day | ORAL | Status: DC

## 2015-03-14 MED ORDER — ATORVASTATIN CALCIUM 40 MG PO TABS: 40.0000 mg | ORAL_TABLET | Freq: Every day | ORAL | Status: DC

## 2015-03-14 NOTE — Progress Notes (Signed)
Patient is discharged from room 5M18 at this time. Alert and in stable condition. IV site d/c'd as well as tele. Instructions read to patient with understanding verbalized. Left unit via wheelchair with family and all belongings at side. 

## 2015-03-14 NOTE — Discharge Summary (Signed)
Physician Discharge Summary  [name] [id number] DOB: [birth year] DOA: 03/12/2015  PCP: Jani Gravel, MD  Admit date: 03/12/2015 Discharge date: 03/14/2015  Time spent: 25 minutes  Recommendations for Outpatient Follow-up:  1. Follow-up with PCP in 1-2 weeks. Metoprolol increased to 25mg , started on Lipitor. Assess for migraine frequency. 2. Follow-up with Neurology.   Discharge Diagnoses:  Active Problems:   Essential hypertension   TIA (transient ischemic attack)   Dependent edema   Discharge Condition: Stable  Diet recommendation: Heart Healthy  Filed Weights   03/12/15 1015  Weight: 77.111 kg (170 lb)    History of present illness:  Maria [name] is a 82 y.o. female with past medical history of HTN, Hyperlipidemia, Peripheral Neuropathy and Arthritis who presented due to due to new onset left arm arm numbness, weakness and tingling with slurred speech.   Hospital Course:  TIA vs complicated migraine: Dysarthria and left arm numbness have completely resolved. MRI showed a small CVA vs artifact at the parietal occipital junction on the right-neurology does not feel that this is a CVA. MRA neck does not show any significant stenosis. 2-D echo showed EF 60-65% with grade 1 diastolic dysfunction. A1c 5.9. LDL 106. Now on aspirin 325mg , started on Lipitor 40mg . Ok for discharge to home, outpatient PT.  Hypertension: Elevated this hospitalization, increase metoprolol to 25mg  and continue PRN Lasix.   Dyslipidemia: LDL 106 with goal <70, started on Lipitor 40mg .  Migraine headaches: Headaches very infrequent, no complaints at present - will hold off on starting prophylactic medications.  Procedures: ECHO Study Conclusions: - Left ventricle: The cavity size was normal. There was mild focalbasal hypertrophy of the septum. Systolic function was normal.The estimated ejection fraction was in the range of 60% to 65%.Wall motion was normal; there were no regional wall  motionabnormalities. Doppler parameters are consistent with abnormalleft ventricular relaxation (grade 1 diastolic dysfunction). - Aortic valve: Trileaflet; mildly thickened, mildly calcifiedleaflets. - Mitral valve: Moderately calcified annulus. Mildly thickenedleaflets . - Left atrium: The atrium was mildly dilated. Impressions: - No cardiac source of emboli was indentified.  Consultations:  Neurology  Discharge Exam: Filed Vitals:   03/14/15 0558 03/14/15 0957  BP: 161/67 120/77  Pulse: 66 77  Temp: 98.8 F (37.1 C) 98.2 F (36.8 C)  Resp:      General: Alert, NAD Cardiovascular: RRR Respiratory: non labored respirations, CTAB, no wheezing Neuro: speech clear, strength 5/5 BUE  Discharge Instructions   Discharge Instructions    Activity as tolerated - No restrictions    Complete by:  As directed      Ambulatory referral to Neurology    Complete by:  As directed      Call MD for:  difficulty breathing, headache or visual disturbances    Complete by:  As directed      Call MD for:  persistant dizziness or light-headedness    Complete by:  As directed      Diet - low sodium heart healthy    Complete by:  As directed           Current Discharge Medication List    START taking these medications   Details  aspirin 325 MG EC tablet Take 1 tablet (325 mg total) by mouth daily. Qty: 30 tablet, Refills: 0    atorvastatin (LIPITOR) 40 MG tablet Take 1 tablet (40 mg total) by mouth daily at 6 PM. Qty: 30 tablet, Refills: 0    !! NONFORMULARY OR COMPOUNDED ITEM Please evaluate and treat  for outpatient PT Diagnoses: Acute CVA Qty: 1 each, Refills: 0    !! NONFORMULARY OR COMPOUNDED ITEM Please evaluate and treat for outpatient OT Diagnoses: Acute CVA Qty: 1 each, Refills: 0     !! - Potential duplicate medications found. Please discuss with provider.    CONTINUE these medications which have CHANGED   Details  metoprolol succinate (TOPROL-XL) 25 MG 24 hr  tablet Take 1 tablet (25 mg total) by mouth daily with breakfast. Qty: 30 tablet, Refills: 0      CONTINUE these medications which have NOT CHANGED   Details  Cholecalciferol (VITAMIN D3) 2000 UNITS TABS Take 2,000 Units by mouth daily.    Cyanocobalamin (VITAMIN B-12 PO) Take 1 tablet by mouth daily.     furosemide (LASIX) 20 MG tablet Take 20 mg by mouth daily as needed for fluid.     VITAMIN E PO Take 1 tablet by mouth daily.        Allergies  Allergen Reactions  . Codeine Nausea And Vomiting  . Tylenol [Acetaminophen]     ITCHING  . Ibuprofen Itching and Rash   Follow-up Information    Follow up with Jani Gravel, MD. Schedule an appointment as soon as possible for a visit in 1 week.   Specialty:  Internal Medicine   Why:  [facility] Follow-up   Contact information:   [address] [place] [address] [phone]       Follow up with Xu,Jindong, MD.   Specialty:  Neurology   Why:  Office will call with date/time   Contact information:   [address] [address] [phone]        The results of significant diagnostics from this hospitalization (including imaging, microbiology, ancillary and laboratory) are listed below for reference.    Significant Diagnostic Studies: Ct Head Wo Contrast  03/12/2015  CLINICAL DATA:  Right-sided TIA. Left facial numbness, slurred speech. EXAM: CT HEAD WITHOUT CONTRAST TECHNIQUE: Contiguous axial images were obtained from the base of the skull through the vertex without intravenous contrast. COMPARISON:  None. FINDINGS: Mild cerebral atrophy. No acute intracranial abnormality. Specifically, no hemorrhage, hydrocephalus, mass lesion, acute infarction, or significant intracranial injury. No acute calvarial abnormality. Visualized paranasal sinuses and mastoids clear. Orbital soft tissues unremarkable. IMPRESSION: No acute intracranial abnormality. Electronically Signed   By: Rolm Baptise  M.D.   On: 03/12/2015 10:43   Mr Virgel Paling Wo Contrast  03/12/2015  CLINICAL DATA:  82 year old hypertensive female with acute onset of left-sided numbness and weakness (face and left upper extremity) with associated difficulty speaking. Presently no complaints. Subsequent encounter. EXAM: MRI HEAD WITHOUT AND WITH CONTRAST MRA HEAD WITHOUT CONTRAST MRA NECK WITHOUT AND WITH CONTRAST TECHNIQUE: Multiplanar, multiecho pulse sequences of the brain and surrounding structures were obtained without and with intravenous contrast. Angiographic images of the Circle of Willis were obtained using MRA technique without intravenous contrast. Angiographic images of the neck were obtained using MRA technique without and with intravenous contrast. Carotid stenosis measurements (when applicable) are obtained utilizing NASCET criteria, using the distal internal carotid diameter as the denominator. CONTRAST:  25mL MULTIHANCE GADOBENATE DIMEGLUMINE 529 MG/ML IV SOLN COMPARISON:  03/12/2015 head CT. FINDINGS: MRI HEAD FINDINGS Tiny acute infarct versus artifact peripheral aspect right parietal-occipital lobe junction. Otherwise no evidence of acute infarct. Remote small infarct occipital lobes. Mild to moderate chronic microvascular ischemic changes. No intracranial hemorrhage. No intracranial mass or abnormal enhancement. Global moderate atrophy without hydrocephalus. Post lens  replacement otherwise orbital structures unremarkable. Cervical medullary junction pituitary region unremarkable. MRA HEAD FINDINGS Mild to moderate narrowing anterior aspect left internal carotid artery cavernous segment. Mild narrowing supraclinoid segment of the internal carotid artery bilaterally. Minimal bulge on left consistent with infundibulum rather than aneurysm. Mild to moderate focal stenosis junction A1 and A2 segment left anterior cerebral artery. Middle cerebral artery branch vessel mild to moderate irregularity narrowing with decrease number  of visualized right middle cerebral are branches compared to the left. No significant stenosis of the M1 segment or carotid terminus on either side. Ectatic vertebral arteries with mild narrowing on the right. Nonvisualized right posterior inferior cerebellar artery and left anterior inferior cerebellar artery. No high-grade stenosis of the basilar artery. High-grade stenosis proximal P2 segment right posterior cerebral artery. Moderate narrowing distal P2 segment left posterior cerebral artery. Posterior cerebral artery distal branch vessel narrowing and irregularity bilaterally. MRA NECK FINDINGS Three vessel aortic arch. Ectatic right common carotid artery without significant narrowing. Plaque with narrowing proximal right internal carotid artery (less than 50%). Ectasia of the right internal carotid artery beyond this region with slight irregularity. Ectatic left internal carotid artery without significant stenosis. Mild moderate narrowing proximal aspect of the vertebral arteries bilaterally. Proximal 3 cm of the vertebral arteries with significant ectasia bilaterally. Mild narrowing proximal subclavian arteries bilaterally. IMPRESSION: MRI HEAD Tiny acute infarct versus artifact peripheral aspect right parietal-occipital lobe junction. Otherwise no evidence of acute infarct. Remote small infarct occipital lobes. Mild to moderate chronic microvascular ischemic changes. No intracranial hemorrhage. No intracranial mass or abnormal enhancement. Global moderate atrophy without hydrocephalus. MRA HEAD Mild to moderate narrowing anterior aspect left internal carotid artery cavernous segment. Mild narrowing supraclinoid segment of the internal carotid artery bilaterally. Mild to moderate focal stenosis junction A1 and A2 segment left anterior cerebral artery. Middle cerebral artery branch vessel mild to moderate irregularity narrowing with decrease number of visualized right middle cerebral are branches compared to  the left. No significant stenosis of the M1 segment or carotid terminus on either side. Ectatic vertebral arteries with mild narrowing on the right. Nonvisualized right posterior inferior cerebellar artery and left anterior inferior cerebellar artery. No high-grade stenosis of the basilar artery. High-grade stenosis proximal P2 segment right posterior cerebral artery. Moderate narrowing distal P2 segment left posterior cerebral artery. Posterior cerebral artery distal branch vessel narrowing and irregularity bilaterally. MRA NECK Plaque with narrowing proximal right internal carotid artery (less than 50%). Ectasia of the right internal carotid artery beyond this region with slight irregularity. Ectatic left internal carotid artery without significant stenosis. Mild to moderate narrowing proximal aspect of the vertebral arteries bilaterally. Mild narrowing proximal subclavian arteries bilaterally. Electronically Signed   By: Genia Del M.D.   On: 03/12/2015 14:29   Mr Angiogram Neck W Wo Contrast  03/12/2015  CLINICAL DATA:  82 year old hypertensive female with acute onset of left-sided numbness and weakness (face and left upper extremity) with associated difficulty speaking. Presently no complaints. Subsequent encounter. EXAM: MRI HEAD WITHOUT AND WITH CONTRAST MRA HEAD WITHOUT CONTRAST MRA NECK WITHOUT AND WITH CONTRAST TECHNIQUE: Multiplanar, multiecho pulse sequences of the brain and surrounding structures were obtained without and with intravenous contrast. Angiographic images of the Circle of Willis were obtained using MRA technique without intravenous contrast. Angiographic images of the neck were obtained using MRA technique without and with intravenous contrast. Carotid stenosis measurements (when applicable) are obtained utilizing NASCET criteria, using the distal internal carotid diameter as the denominator. CONTRAST:  65mL MULTIHANCE GADOBENATE DIMEGLUMINE  529 MG/ML IV SOLN COMPARISON:  03/12/2015  head CT. FINDINGS: MRI HEAD FINDINGS Tiny acute infarct versus artifact peripheral aspect right parietal-occipital lobe junction. Otherwise no evidence of acute infarct. Remote small infarct occipital lobes. Mild to moderate chronic microvascular ischemic changes. No intracranial hemorrhage. No intracranial mass or abnormal enhancement. Global moderate atrophy without hydrocephalus. Post lens replacement otherwise orbital structures unremarkable. Cervical medullary junction pituitary region unremarkable. MRA HEAD FINDINGS Mild to moderate narrowing anterior aspect left internal carotid artery cavernous segment. Mild narrowing supraclinoid segment of the internal carotid artery bilaterally. Minimal bulge on left consistent with infundibulum rather than aneurysm. Mild to moderate focal stenosis junction A1 and A2 segment left anterior cerebral artery. Middle cerebral artery branch vessel mild to moderate irregularity narrowing with decrease number of visualized right middle cerebral are branches compared to the left. No significant stenosis of the M1 segment or carotid terminus on either side. Ectatic vertebral arteries with mild narrowing on the right. Nonvisualized right posterior inferior cerebellar artery and left anterior inferior cerebellar artery. No high-grade stenosis of the basilar artery. High-grade stenosis proximal P2 segment right posterior cerebral artery. Moderate narrowing distal P2 segment left posterior cerebral artery. Posterior cerebral artery distal branch vessel narrowing and irregularity bilaterally. MRA NECK FINDINGS Three vessel aortic arch. Ectatic right common carotid artery without significant narrowing. Plaque with narrowing proximal right internal carotid artery (less than 50%). Ectasia of the right internal carotid artery beyond this region with slight irregularity. Ectatic left internal carotid artery without significant stenosis. Mild moderate narrowing proximal aspect of the  vertebral arteries bilaterally. Proximal 3 cm of the vertebral arteries with significant ectasia bilaterally. Mild narrowing proximal subclavian arteries bilaterally. IMPRESSION: MRI HEAD Tiny acute infarct versus artifact peripheral aspect right parietal-occipital lobe junction. Otherwise no evidence of acute infarct. Remote small infarct occipital lobes. Mild to moderate chronic microvascular ischemic changes. No intracranial hemorrhage. No intracranial mass or abnormal enhancement. Global moderate atrophy without hydrocephalus. MRA HEAD Mild to moderate narrowing anterior aspect left internal carotid artery cavernous segment. Mild narrowing supraclinoid segment of the internal carotid artery bilaterally. Mild to moderate focal stenosis junction A1 and A2 segment left anterior cerebral artery. Middle cerebral artery branch vessel mild to moderate irregularity narrowing with decrease number of visualized right middle cerebral are branches compared to the left. No significant stenosis of the M1 segment or carotid terminus on either side. Ectatic vertebral arteries with mild narrowing on the right. Nonvisualized right posterior inferior cerebellar artery and left anterior inferior cerebellar artery. No high-grade stenosis of the basilar artery. High-grade stenosis proximal P2 segment right posterior cerebral artery. Moderate narrowing distal P2 segment left posterior cerebral artery. Posterior cerebral artery distal branch vessel narrowing and irregularity bilaterally. MRA NECK Plaque with narrowing proximal right internal carotid artery (less than 50%). Ectasia of the right internal carotid artery beyond this region with slight irregularity. Ectatic left internal carotid artery without significant stenosis. Mild to moderate narrowing proximal aspect of the vertebral arteries bilaterally. Mild narrowing proximal subclavian arteries bilaterally. Electronically Signed   By: Genia Del M.D.   On: 03/12/2015 14:29    Mr Jeri Cos [id number] Contrast  03/12/2015  CLINICAL DATA:  82 year old hypertensive female with acute onset of left-sided numbness and weakness (face and left upper extremity) with associated difficulty speaking. Presently no complaints. Subsequent encounter. EXAM: MRI HEAD WITHOUT AND WITH CONTRAST MRA HEAD WITHOUT CONTRAST MRA NECK WITHOUT AND WITH CONTRAST TECHNIQUE: Multiplanar, multiecho pulse sequences of the brain and surrounding structures were obtained without and  with intravenous contrast. Angiographic images of the Circle of Willis were obtained using MRA technique without intravenous contrast. Angiographic images of the neck were obtained using MRA technique without and with intravenous contrast. Carotid stenosis measurements (when applicable) are obtained utilizing NASCET criteria, using the distal internal carotid diameter as the denominator. CONTRAST:  27mL MULTIHANCE GADOBENATE DIMEGLUMINE 529 MG/ML IV SOLN COMPARISON:  03/12/2015 head CT. FINDINGS: MRI HEAD FINDINGS Tiny acute infarct versus artifact peripheral aspect right parietal-occipital lobe junction. Otherwise no evidence of acute infarct. Remote small infarct occipital lobes. Mild to moderate chronic microvascular ischemic changes. No intracranial hemorrhage. No intracranial mass or abnormal enhancement. Global moderate atrophy without hydrocephalus. Post lens replacement otherwise orbital structures unremarkable. Cervical medullary junction pituitary region unremarkable. MRA HEAD FINDINGS Mild to moderate narrowing anterior aspect left internal carotid artery cavernous segment. Mild narrowing supraclinoid segment of the internal carotid artery bilaterally. Minimal bulge on left consistent with infundibulum rather than aneurysm. Mild to moderate focal stenosis junction A1 and A2 segment left anterior cerebral artery. Middle cerebral artery branch vessel mild to moderate irregularity narrowing with decrease number of visualized right middle  cerebral are branches compared to the left. No significant stenosis of the M1 segment or carotid terminus on either side. Ectatic vertebral arteries with mild narrowing on the right. Nonvisualized right posterior inferior cerebellar artery and left anterior inferior cerebellar artery. No high-grade stenosis of the basilar artery. High-grade stenosis proximal P2 segment right posterior cerebral artery. Moderate narrowing distal P2 segment left posterior cerebral artery. Posterior cerebral artery distal branch vessel narrowing and irregularity bilaterally. MRA NECK FINDINGS Three vessel aortic arch. Ectatic right common carotid artery without significant narrowing. Plaque with narrowing proximal right internal carotid artery (less than 50%). Ectasia of the right internal carotid artery beyond this region with slight irregularity. Ectatic left internal carotid artery without significant stenosis. Mild moderate narrowing proximal aspect of the vertebral arteries bilaterally. Proximal 3 cm of the vertebral arteries with significant ectasia bilaterally. Mild narrowing proximal subclavian arteries bilaterally. IMPRESSION: MRI HEAD Tiny acute infarct versus artifact peripheral aspect right parietal-occipital lobe junction. Otherwise no evidence of acute infarct. Remote small infarct occipital lobes. Mild to moderate chronic microvascular ischemic changes. No intracranial hemorrhage. No intracranial mass or abnormal enhancement. Global moderate atrophy without hydrocephalus. MRA HEAD Mild to moderate narrowing anterior aspect left internal carotid artery cavernous segment. Mild narrowing supraclinoid segment of the internal carotid artery bilaterally. Mild to moderate focal stenosis junction A1 and A2 segment left anterior cerebral artery. Middle cerebral artery branch vessel mild to moderate irregularity narrowing with decrease number of visualized right middle cerebral are branches compared to the left. No significant  stenosis of the M1 segment or carotid terminus on either side. Ectatic vertebral arteries with mild narrowing on the right. Nonvisualized right posterior inferior cerebellar artery and left anterior inferior cerebellar artery. No high-grade stenosis of the basilar artery. High-grade stenosis proximal P2 segment right posterior cerebral artery. Moderate narrowing distal P2 segment left posterior cerebral artery. Posterior cerebral artery distal branch vessel narrowing and irregularity bilaterally. MRA NECK Plaque with narrowing proximal right internal carotid artery (less than 50%). Ectasia of the right internal carotid artery beyond this region with slight irregularity. Ectatic left internal carotid artery without significant stenosis. Mild to moderate narrowing proximal aspect of the vertebral arteries bilaterally. Mild narrowing proximal subclavian arteries bilaterally. Electronically Signed   By: Genia Del M.D.   On: 03/12/2015 14:29    Microbiology: No results found for this or any previous visit (  from the past 240 hour(s)).   Labs: Basic Metabolic Panel:  Recent Labs Lab 03/12/15 1021 03/12/15 1031 03/13/15 0821  NA 142 142 141  K 4.1 4.0 4.2  CL 106 103 108  CO2 27  --  25  GLUCOSE 103* 94 133*  BUN 15 17 10   CREATININE 0.82 0.90 0.83  CALCIUM 9.2  --  9.0   Liver Function Tests:  Recent Labs Lab 03/12/15 1021 03/13/15 0821  AST 24 22  ALT 14 13*  ALKPHOS 93 93  BILITOT 0.6 0.4  PROT 6.5 6.5  ALBUMIN 4.0 3.8   No results for input(s): LIPASE, AMYLASE in the last 168 hours. No results for input(s): AMMONIA in the last 168 hours. CBC:  Recent Labs Lab 03/12/15 1021 03/12/15 1031 03/13/15 0821  WBC 4.5  --  3.8*  NEUTROABS 3.1  --   --   HGB 13.1 14.6 13.7  HCT 40.4 43.0 42.1  MCV 92.2  --  91.9  PLT 175  --  180   Cardiac Enzymes: No results for input(s): CKTOTAL, CKMB, CKMBINDEX, TROPONINI in the last 168 hours. BNP: BNP (last 3 results) No results for  input(s): BNP in the last 8760 hours.  ProBNP (last 3 results) No results for input(s): PROBNP in the last 8760 hours.  CBG:  Recent Labs Lab 03/12/15 1716  GLUCAP 99       Signed:  Nena Alexander MD  [facility] 03/14/2015, 11:35 AM

## 2015-03-14 NOTE — Progress Notes (Signed)
STROKE TEAM PROGRESS NOTE   SUBJECTIVE (INTERVAL HISTORY) Patient is dressed and up in the chair at the bedside. Husband also present. Both are ready for her to go home. No complains.    OBJECTIVE Temp:  [97.6 F (36.4 C)-98.8 F (37.1 C)] 98.2 F (36.8 C) (02/02 0957) Pulse Rate:  [53-80] 77 (02/02 0957) Cardiac Rhythm:  [-] Heart block (02/02 0857) Resp:  [16-20] 18 (02/01 2110) BP: (120-180)/(50-77) 120/77 mmHg (02/02 0957) SpO2:  [95 %-98 %] 97 % (02/02 0957)  CBC:   Recent Labs Lab 03/12/15 1021 03/12/15 1031 03/13/15 0821  WBC 4.5  --  3.8*  NEUTROABS 3.1  --   --   HGB 13.1 14.6 13.7  HCT 40.4 43.0 42.1  MCV 92.2  --  91.9  PLT 175  --  99991111    Basic Metabolic Panel:   Recent Labs Lab 03/12/15 1021 03/12/15 1031 03/13/15 0821  NA 142 142 141  K 4.1 4.0 4.2  CL 106 103 108  CO2 27  --  25  GLUCOSE 103* 94 133*  BUN 15 17 10   CREATININE 0.82 0.90 0.83  CALCIUM 9.2  --  9.0    Lipid Panel:     Component Value Date/Time   CHOL 173 03/14/2015 0558   TRIG 111 03/14/2015 0558   HDL 45 03/14/2015 0558   CHOLHDL 3.8 03/14/2015 0558   VLDL 22 03/14/2015 0558   LDLCALC 106* 03/14/2015 0558   HgbA1c:  Lab Results  Component Value Date   HGBA1C 5.9* 03/13/2015   Urine Drug Screen: No results found for: LABOPIA, COCAINSCRNUR, LABBENZ, AMPHETMU, THCU, LABBARB    IMAGING I have personally reviewed the radiological images below and agree with the radiology interpretations. Blue text is Dr. Phoebe Sharps interpretation.  Ct Head Wo Contrast 03/12/2015   No acute intracranial abnormality.   MRI HEAD  03/12/2015   Tiny acute infarct versus artifact peripheral aspect right parietal-occipital lobe junction. Otherwise no evidence of acute infarct. Remote small infarct occipital lobes. Mild to moderate chronic microvascular ischemic changes. No intracranial hemorrhage. No intracranial mass or abnormal enhancement. Global moderate atrophy without hydrocephalus. I would  favor the peripheral artifact.   MRA HEAD  03/12/2015   Mild to moderate narrowing anterior aspect left internal carotid artery cavernous segment. Mild narrowing supraclinoid segment of the internal carotid artery bilaterally. Mild to moderate focal stenosis junction A1 and A2 segment left anterior cerebral artery. Middle cerebral artery branch vessel mild to moderate irregularity narrowing with decrease number of visualized right middle cerebral are branches compared to the left. No significant stenosis of the M1 segment or carotid terminus on either side. Ectatic vertebral arteries with mild narrowing on the right. Nonvisualized right posterior inferior cerebellar artery and left anterior inferior cerebellar artery. No high-grade stenosis of the basilar artery. High-grade stenosis proximal P2 segment right posterior cerebral artery. Moderate narrowing distal P2 segment left posterior cerebral artery. Posterior cerebral artery distal branch vessel narrowing and irregularity bilaterally.   MRA NECK  03/12/2015   Plaque with narrowing proximal right internal carotid artery (less than 50%). Ectasia of the right internal carotid artery beyond this region with slight irregularity. Ectatic left internal carotid artery without significant stenosis. Mild to moderate narrowing proximal aspect of the vertebral arteries bilaterally. Mild narrowing proximal subclavian arteries bilaterally.   2D echo  - Left ventricle: The cavity size was normal. There was mild focalbasal hypertrophy of the septum. Systolic function was normal.The estimated ejection fraction was in the range  of 60% to 65%.Wall motion was normal; there were no regional wall motionabnormalities. Doppler parameters are consistent with abnormalleft ventricular relaxation (grade 1 diastolic dysfunction). - Aortic valve: Trileaflet; mildly thickened, mildly calcified leaflets. - Mitral valve: Moderately calcified annulus. Mildly thickened leaflets  . - Left atrium: The atrium was mildly dilated. - Impressions:  No cardiac source of emboli was indentified.   Physical exam  Temp:  [97.6 F (36.4 C)-98.8 F (37.1 C)] 98.2 F (36.8 C) (02/02 0957) Pulse Rate:  [53-80] 77 (02/02 0957) Resp:  [16-20] 18 (02/01 2110) BP: (120-180)/(50-77) 120/77 mmHg (02/02 0957) SpO2:  [95 %-98 %] 97 % (02/02 0957)  General - Well nourished, well developed, in no apparent distress.  Ophthalmologic - Fundi not visualized due to eye movement.  Cardiovascular - Regular rate and rhythm.  Mental Status -  Level of arousal and orientation to time, place, and person were intact. Language including expression, naming, repetition, comprehension was assessed and found intact. Fund of Knowledge was assessed and was intact.  Cranial Nerves II - XII - II - Visual field intact OU. III, IV, VI - Extraocular movements intact. V - Facial sensation intact bilaterally. VII - Facial movement intact bilaterally. VIII - Hearing & vestibular intact bilaterally. X - Palate elevates symmetrically. XI - Chin turning & shoulder shrug intact bilaterally. XII - Tongue protrusion intact.  Motor Strength - The patient's strength was normal in all extremities and pronator drift was absent.  Bulk was normal and fasciculations were absent.   Motor Tone - Muscle tone was assessed at the neck and appendages and was normal.  Reflexes - The patient's reflexes were 1+ in all extremities and she had no pathological reflexes.  Sensory - Light touch, temperature/pinprick were assessed and were symmetrical.    Coordination - The patient had normal movements in the hands and feet with no ataxia or dysmetria.  Tremor was absent.  Gait and Station - deferred to PT due to safety concerns.   ASSESSMENT/PLAN Ms. TANGO BAUSERMAN is a 82 y.o. female with history of hypertension, hyper lipidemia, glaucoma, migraines, arthritis, peripheral neuropathy presenting with left arm  tingling and slurred speech. She did not receive IV t-PA due to symptoms resolved.   Complicated migraine most likely, but cannot fully rule out TIA  Resultant  Neurologic symptoms resolved  MRI  likely artifact R parietal-occipital junction (do not feel pt had stroke based on hx). 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   MRA head high grade stenosis R P2, mod L P2  MRA neck no significant stenosis  2D Echo  No source of embolus   LDL 106   HgbA1c 5.9  Lovenox 40 mg sq daily for VTE prophylaxis Diet Heart Room service appropriate?: Yes; Fluid consistency:: Thin  No antithrombotic prior to admission, now on aspirin 81 mg daily. Aspirin dose increased to 325 mg for stroke prevention. Continue ASA on discharge.  Patient counseled to be compliant with her antithrombotic medications  Ongoing aggressive stroke risk factor management  Therapy recommendations:  OP PT, No OT, no ST  Disposition:  Return home with husband  Hypertension  Elevated on arrival at 206/93  Stable/improved today  Hyperlipidemia  Home meds:  NO STATIN  LDL 106, goal < 100  Pt reports she has taken lipitor in the past, but she can get her cholesterol down on her own ... She had difficulties on lipitor. She states she will consider lipitor at time followup  labs are completed.  Other Stroke Risk Factors  Advanced age  Former Cigarette smoker  Migraines - short lasting, occurs every 1-2 months, no medication needed as per pt  NOTHING FURTHER TO ADD FROM THE STROKE STANDPOINT  Ongoing risk factor control by Primary Care Physician  Stroke Service will sign off. Please call should any needs arise.  No neuro Follow-up indicated at this time  Hospital day # 1  Neurology will sign off. Please call with questions. No neuro follow up needed at this time. Thanks for the consult.  Rosalin Hawking, MD PhD Stroke Neurology 03/14/2015 9:31 PM    To contact  Stroke Continuity provider, please refer to [email or web address] After hours, contact General Neurology

## 2015-03-14 NOTE — Care Management Note (Signed)
Case Management Note  Patient Details  Name: Maria [name] MRN: [id number] Date of Birth: [birth year]  Subjective/Objective:                    Action/Plan: Plan is for patient to discharge home with her husband. Dr Sloan Leiter ordered outpatient PT/OT. CM met with the patient and her husband and asked if she was interested in going to [facility]. Ms Hice stated she was not sure she wanted any outpatient therapy. CM instructed her that Dr Sloan Leiter has prescriptions for her for outpatient therapy and if she decides she would like to go she could call [facility] and schedule an appointment and take her prescriptions. Information for [facility] placed on her AVS and information given to the patient. Bedside RN updated.   Expected Discharge Date:                  Expected Discharge Plan:  Home/Self Care  In-House Referral:     Discharge planning Services  CM Consult  Post Acute Care Choice:    Choice offered to:     DME Arranged:    DME Agency:     HH Arranged:    [place] Agency:     Status of Service:  Completed, signed off  Medicare Important Message Given:    Date Medicare IM Given:    Medicare IM give by:    Date Additional Medicare IM Given:    Additional Medicare Important Message give by:     If discussed at [place] of Stay Meetings, dates discussed:    Additional Comments:  Pollie Friar, RN 03/14/2015, 2:47 PM

## 2015-03-14 NOTE — Care Management Obs Status (Signed)
[place] NOTIFICATION   Patient Details  Name: Maria [name] MRN: [id number] Date of Birth: Jul 13, 1922   Medicare Observation Status Notification Given:  Yes    Pollie Friar, RN 03/14/2015, 12:02 PM

## 2015-08-02 ENCOUNTER — Emergency Department (HOSPITAL_COMMUNITY)
Admission: EM | Admit: 2015-08-02 | Discharge: 2015-08-03 | Disposition: A | Discharge status: Home / Self Care | Payer: Medicare Other | Attending: Emergency Medicine | Admitting: Emergency Medicine

## 2015-08-02 ENCOUNTER — Encounter (HOSPITAL_COMMUNITY): Payer: Self-pay | Admitting: *Deleted

## 2015-08-02 ENCOUNTER — Emergency Department (HOSPITAL_COMMUNITY): Payer: Medicare Other

## 2015-08-02 ENCOUNTER — Other Ambulatory Visit: Payer: Self-pay

## 2015-08-02 DIAGNOSIS — G459 Transient cerebral ischemic attack, unspecified: Secondary | ICD-10-CM | POA: Diagnosis not present

## 2015-08-02 DIAGNOSIS — Z79899 Other long term (current) drug therapy: Secondary | ICD-10-CM | POA: Diagnosis not present

## 2015-08-02 DIAGNOSIS — R200 Anesthesia of skin: Secondary | ICD-10-CM | POA: Diagnosis present

## 2015-08-02 DIAGNOSIS — D649 Anemia, unspecified: Secondary | ICD-10-CM | POA: Diagnosis not present

## 2015-08-02 DIAGNOSIS — Z96642 Presence of left artificial hip joint: Secondary | ICD-10-CM | POA: Insufficient documentation

## 2015-08-02 DIAGNOSIS — Z87891 Personal history of nicotine dependence: Secondary | ICD-10-CM | POA: Insufficient documentation

## 2015-08-02 DIAGNOSIS — I10 Essential (primary) hypertension: Secondary | ICD-10-CM | POA: Diagnosis not present

## 2015-08-02 DIAGNOSIS — Z96641 Presence of right artificial hip joint: Secondary | ICD-10-CM | POA: Diagnosis not present

## 2015-08-02 DIAGNOSIS — Z7982 Long term (current) use of aspirin: Secondary | ICD-10-CM | POA: Insufficient documentation

## 2015-08-02 DIAGNOSIS — Z8673 Personal history of transient ischemic attack (TIA), and cerebral infarction without residual deficits: Secondary | ICD-10-CM | POA: Diagnosis not present

## 2015-08-02 LAB — CBC WITH DIFFERENTIAL/PLATELET
BASOS ABS: 0 10*3/uL (ref 0.0–0.1)
Basophils Relative: 1 %
EOS ABS: 0.2 10*3/uL (ref 0.0–0.7)
EOS PCT: 5 %
HCT: 35.8 % — ABNORMAL LOW (ref 36.0–46.0)
Hemoglobin: 11.2 g/dL — ABNORMAL LOW (ref 12.0–15.0)
LYMPHS ABS: 1.1 10*3/uL (ref 0.7–4.0)
LYMPHS PCT: 28 %
MCH: 28.1 pg (ref 26.0–34.0)
MCHC: 31.3 g/dL (ref 30.0–36.0)
MCV: 89.9 fL (ref 78.0–100.0)
MONO ABS: 0.4 10*3/uL (ref 0.1–1.0)
Monocytes Relative: 10 %
Neutro Abs: 2.3 10*3/uL (ref 1.7–7.7)
Neutrophils Relative %: 56 %
PLATELETS: 188 10*3/uL (ref 150–400)
RBC: 3.98 MIL/uL (ref 3.87–5.11)
RDW: 14 % (ref 11.5–15.5)
WBC: 4.1 10*3/uL (ref 4.0–10.5)

## 2015-08-02 NOTE — ED Notes (Signed)
MD at bedside. 

## 2015-08-02 NOTE — ED Provider Notes (Signed)
CSN: [id number]     Arrival date & time 08/02/15  2232 History   By signing my name below, I, Altamease Oiler, attest that this documentation has been prepared under the direction and in the presence of Delora Fuel, MD. Electronically Signed: Altamease Oiler, ED Scribe. 08/02/2015. 11:36 PM   Chief Complaint  Patient presents with  . Hypertension    The history is provided by the patient. No language interpreter was used.   JUDYANN RICCHIO is a 82 y.o. female with PMHx of HTN, TIA, migraines, peripheral neuropathy, and hypercholesteremia who presents to the Emergency Department complaining of an episode of numbness in the left hand and at the face with onset approximately 6 hours ago. Associated symptoms include speech difficulty ("thinking about the words but the sound funny"). The episode lasted foe approximately 20 minutes before resolving. She states that she had similar symptoms in January of this year with a stroke. Her blood pressure was 220/90 for EMS.  Pt denies slurred speech, weakness, headache, and dizziness. She does take aspirin daily.   Past Medical History  Diagnosis Date  . Peripheral neuropathy ([place])   . PONV (postoperative nausea and vomiting)   . Rib fractures     IN THE PAST  . Osteoporosis   . Arthritis     OA AND PAIN LEFT HIP  . Headache(784.0)     HX OF MIGRAINES  . Hypertension   . Allergic urticaria   . Shingles 1940'S  . Glaucoma   . Hypercholesterolemia    Past Surgical History  Procedure Laterality Date  . Joint replacement  2011    RIGHT TOTAL HIP REPLACEMENT  . Tonsillectomy  1934  . Abdominal hysterectomy  1958  . Dilation and curettage of uterus      MULTIPLE  . Eye surgery  1998 & 2006    BILATERAL CATARACT EXTRACTION  . Appendectomy  1937  . External fixator left arm fracture 1997    . Total hip arthroplasty  05/27/2011    Procedure: TOTAL HIP ARTHROPLASTY;  Surgeon: Gearlean Alf, MD;  Location: [facility] ORS;  Service: Orthopedics;   Laterality: Left;   Family History  Problem Relation Age of Onset  . Heart disease Mother    Social History  Substance Use Topics  . Smoking status: Former Research scientist (life sciences)  . Smokeless tobacco: None     Comment: 44 YRS AGO  . Alcohol Use: No   OB History    No data available     Review of Systems  Neurological: Positive for speech difficulty and numbness. Negative for facial asymmetry, weakness and headaches.  All other systems reviewed and are negative.  Allergies  Codeine; Tylenol; and Ibuprofen  Home Medications   Prior to Admission medications   Medication Sig Start Date End Date Taking? Authorizing Provider  aspirin 325 MG EC tablet Take 1 tablet (325 mg total) by mouth daily. 03/14/15  Yes Shanker Kristeen Mans, MD  Cholecalciferol (VITAMIN D3) 2000 UNITS TABS Take 2,000 Units by mouth daily.   Yes Historical Provider, MD  Cyanocobalamin (VITAMIN B-12 PO) Take 1 tablet by mouth daily.    Yes Historical Provider, MD  furosemide (LASIX) 20 MG tablet Take 20 mg by mouth daily as needed for fluid.    Yes Historical Provider, MD  metoprolol succinate (TOPROL-XL) 25 MG 24 hr tablet Take 1 tablet (25 mg total) by mouth daily with breakfast. 03/14/15  Yes Shanker Kristeen Mans, MD  Multiple Vitamins-Minerals (PRESERVISION AREDS PO) Take 1 tablet  by mouth 2 (two) times daily.   Yes Historical Provider, MD  VITAMIN E PO Take 1 tablet by mouth daily.    Yes Historical Provider, MD   BP 195/74 mmHg  Pulse 58  Temp(Src) 98 F (36.7 C) (Oral)  Resp 15  SpO2 96% Physical Exam  Constitutional: She is oriented to person, place, and time. She appears well-developed and well-nourished. No distress.  HENT:  Head: Normocephalic and atraumatic.  Mouth/Throat: No oropharyngeal exudate.  Eyes: EOM are normal. Pupils are equal, round, and reactive to light.  Neck: Normal range of motion. Neck supple. No JVD present.  No carotid bruits  Cardiovascular: Normal rate, regular rhythm and normal heart sounds.    No murmur heard. Pulmonary/Chest: Effort normal and breath sounds normal. She has no wheezes. She has no rales. She exhibits no tenderness.  Abdominal: Soft. Bowel sounds are normal. She exhibits no distension and no mass. There is no tenderness.  Musculoskeletal: Normal range of motion. She exhibits no edema.  1+ pitting edema bilaterally with moderate venous stasis changes.   Lymphadenopathy:    She has no cervical adenopathy.  Neurological: She is alert and oriented to person, place, and time. No cranial nerve deficit. She exhibits normal muscle tone. Coordination normal.  Cranial Nerves: tongue protrudes to the right of midline  No pronator drift No sensory deficits Strength 5/5 at both arms and both legs   Skin: Skin is warm and dry. No rash noted.  Psychiatric: She has a normal mood and affect. Her behavior is normal. Judgment and thought content normal.  Nursing note and vitals reviewed.   ED Course  Procedures (including critical care time) DIAGNOSTIC STUDIES: Oxygen Saturation is 96% on RA,  normal by my interpretation.    COORDINATION OF CARE: 11:26 PM Discussed treatment plan which includes lab work and MRI with pt at bedside and pt agreed to plan.  2:07AM-Consult complete with Dr. Nicole Kindred (Neurology). Patient case explained and discussed. He has reviewed the MRI and recommends no change in management. Call ended at 2:10 AM    Labs Review Results for orders placed or performed during the hospital encounter of 08/02/15  Comprehensive metabolic panel  Result Value Ref Range   Sodium 142 135 - 145 mmol/L   Potassium 3.9 3.5 - 5.1 mmol/L   Chloride 102 101 - 111 mmol/L   CO2 25 22 - 32 mmol/L   Glucose, Bld 97 65 - 99 mg/dL   BUN 20 6 - 20 mg/dL   Creatinine, Ser 0.78 0.44 - 1.00 mg/dL   Calcium 8.2 (L) 8.9 - 10.3 mg/dL   Total Protein 6.0 (L) 6.5 - 8.1 g/dL   Albumin 3.6 3.5 - 5.0 g/dL   AST 21 15 - 41 U/L   ALT 14 14 - 54 U/L   Alkaline Phosphatase 80 38 - 126  U/L   Total Bilirubin 0.6 0.3 - 1.2 mg/dL   GFR calc non Af Amer >60 >60 mL/min   GFR calc Af Amer >60 >60 mL/min   Anion gap 15 5 - 15  CBC with Differential  Result Value Ref Range   WBC 4.1 4.0 - 10.5 K/uL   RBC 3.98 3.87 - 5.11 MIL/uL   Hemoglobin 11.2 (L) 12.0 - 15.0 g/dL   HCT 35.8 (L) 36.0 - 46.0 %   MCV 89.9 78.0 - 100.0 fL   MCH 28.1 26.0 - 34.0 pg   MCHC 31.3 30.0 - 36.0 g/dL   RDW 14.0 11.5 - 15.5 %  Platelets 188 150 - 400 K/uL   Neutrophils Relative % 56 %   Neutro Abs 2.3 1.7 - 7.7 K/uL   Lymphocytes Relative 28 %   Lymphs Abs 1.1 0.7 - 4.0 K/uL   Monocytes Relative 10 %   Monocytes Absolute 0.4 0.1 - 1.0 K/uL   Eosinophils Relative 5 %   Eosinophils Absolute 0.2 0.0 - 0.7 K/uL   Basophils Relative 1 %   Basophils Absolute 0.0 0.0 - 0.1 K/uL    Imaging Review Mr Brain Wo Contrast  08/03/2015  CLINICAL DATA:  LEFT hand and face numbness for 6 hours. Speech difficulty. History of hypertension, transient ischemic attack, migraines, hypercholesterolemia. EXAM: MRI HEAD WITHOUT CONTRAST TECHNIQUE: Multiplanar, multiecho pulse sequences of the brain and surrounding structures were obtained without intravenous contrast. COMPARISON:  MRI of the brain March 12, 2015 FINDINGS: INTRACRANIAL CONTENTS: No reduced diffusion to suggest acute ischemia. Punctate focus of susceptibility artifact LEFT temporal lobe new from prior MRI, no surrounding edema to suggest acute process. Otherwise similar punctate foci of nonspecific susceptibility artifact. The ventricles and sulci are normal for patient's age. Minimal white matter changes are less than expected for age. No suspicious parenchymal signal, masses or mass effect. Old small LEFT cerebellar infarct. No abnormal extra-axial fluid collections. No extra-axial masses though, contrast enhanced sequences would be more sensitive. Normal major intracranial vascular flow voids present at skull base. ORBITS: The included ocular globes and  orbital contents are non-suspicious. Status post bilateral ocular lens implants. SINUSES: The mastoid air-cells and included paranasal sinuses are well-aerated. SKULL/SOFT TISSUES: No abnormal sellar expansion. No suspicious calvarial bone marrow signal. Craniocervical junction maintained. Patient is edentulous. IMPRESSION: No acute intracranial process. New nonacute LEFT temporal microhemorrhage versus artifact. Minimal chronic small vessel ischemic disease. Small LEFT cerebellar infarct. Electronically Signed   By: Elon Alas M.D.   On: 08/03/2015 01:00   I have personally reviewed and evaluated these images and lab results as part of my medical decision-making.   EKG Interpretation   Date/Time:  Friday August 02 2015 22:42:06 EDT Ventricular Rate:  54 PR Interval:    QRS Duration: 88 QT Interval:  469 QTC Calculation: 445 R Axis:   68 Text Interpretation:  Sinus rhythm Atrial premature complex When compared  with ECG of 03/12/2015, No significant change was found Confirmed by [facility]   MD, Micki Cassel ([id number]) on 08/02/2015 10:51:15 PM      MDM   Final diagnoses:  Transient cerebral ischemia, unspecified transient cerebral ischemia type  Essential hypertension  Normochromic normocytic anemia    Apparent transient ischemic attack. Neurologic exam is remarkable only for tongue protruding to the right of midline. Old records are reviewed and she had a similar presentation in January of this year with essentially negative workup. There was questionable small stroke versus motion artifact. Patient's blood pressure is elevated and will be observed. She will be sent for MRI to rule out new stroke. Code stroke is not activated because of no residual deficits.  MRI shows questionable new finding but not in a location that would account for patient's symptoms. Case was discussed with Dr. Nicole Kindred of neurology service who has reviewed the MRI and feels it does not show evidence of an acute stroke. In  addition, he recommends no change in patient's medications-continue daily aspirin 325 mg. This information was relayed to the family. No indication for hospitalization since no new stroke. Blood pressures come down with simple observation. Follow-up with PCP.  I personally performed the services  described in this documentation, which was scribed in my presence. The recorded information has been reviewed and is accurate.      Delora Fuel, MD [id number] [phone]

## 2015-08-02 NOTE — ED Notes (Signed)
Per EMS: pt coming from home with c/o numbness and tingling in left and and right nose around 1700. Pt took 325 asa at 1730, symptoms resolved. Initial medic BP 220/90 in both arms. No stroke symptoms noted. Last BP 184/80, HR 60, denies CP, denies dizziness, and shortness of breath. Hx of stroke in March, no deficits from the stroke.

## 2015-08-03 LAB — COMPREHENSIVE METABOLIC PANEL
ALT: 14 U/L (ref 14–54)
AST: 21 U/L (ref 15–41)
Albumin: 3.6 g/dL (ref 3.5–5.0)
Alkaline Phosphatase: 80 U/L (ref 38–126)
Anion gap: 15 (ref 5–15)
BUN: 20 mg/dL (ref 6–20)
CO2: 25 mmol/L (ref 22–32)
Calcium: 8.2 mg/dL — ABNORMAL LOW (ref 8.9–10.3)
Chloride: 102 mmol/L (ref 101–111)
Creatinine, Ser: 0.78 mg/dL (ref 0.44–1.00)
GFR calc Af Amer: >60 mL/min (ref >60)
GFR calc non Af Amer: >60 mL/min (ref >60)
Glucose, Bld: 97 mg/dL (ref 65–99)
Potassium: 3.9 mmol/L (ref 3.5–5.1)
Sodium: 142 mmol/L (ref 135–145)
Total Bilirubin: 0.6 mg/dL (ref 0.3–1.2)
Total Protein: 6 g/dL — ABNORMAL LOW (ref 6.5–8.1)

## 2015-08-03 NOTE — Discharge Instructions (Signed)
Continue your current medications. Monitor your blood pressure at home.  Transient Ischemic Attack A transient ischemic attack (TIA) is a "warning stroke" that causes stroke-like symptoms. Unlike a stroke, a TIA does not cause permanent damage to the brain. The symptoms of a TIA can happen very fast and do not last long. It is important to know the symptoms of a TIA and what to do. This can help prevent a major stroke or death. CAUSES  A TIA is caused by a temporary blockage in an artery in the brain or neck (carotid artery). The blockage does not allow the brain to get the blood supply it needs and can cause different symptoms. The blockage can be caused by either:  A blood clot.  Fatty buildup (plaque) in a neck or brain artery. RISK FACTORS  High blood pressure (hypertension).  High cholesterol.  Diabetes mellitus.  Heart disease.  The buildup of plaque in the blood vessels (peripheral artery disease or atherosclerosis).  The buildup of plaque in the blood vessels that provide blood and oxygen to the brain (carotid artery stenosis).  An abnormal heart rhythm (atrial fibrillation).  Obesity.  Using any tobacco products, including cigarettes, chewing tobacco, or electronic cigarettes.  Taking oral contraceptives, especially in combination with using tobacco.  Physical inactivity.  A diet high in fats, salt (sodium), and calories.  Excessive alcohol use.  Use of illegal drugs (especially cocaine and methamphetamine).  Being female.  Being African American.  Being over the age of 48 years.  Family history of stroke.  Previous history of blood clots, stroke, TIA, or heart attack.  Sickle cell disease. SIGNS AND SYMPTOMS  TIA symptoms are the same as a stroke but are temporary. These symptoms usually develop suddenly, or may be newly present upon waking from sleep:  Sudden weakness or numbness of the face, arm, or leg, especially on one side of the body.  Sudden  trouble walking or difficulty moving arms or legs.  Sudden confusion.  Sudden personality changes.  Trouble speaking (aphasia) or understanding.  Difficulty swallowing.  Sudden trouble seeing in one or both eyes.  Double vision.  Dizziness.  Loss of balance or coordination.  Sudden severe headache with no known cause.  Trouble reading or writing.  Loss of bowel or bladder control.  Loss of consciousness. DIAGNOSIS  Your health care provider may be able to determine the presence or absence of a TIA based on your symptoms, history, and physical exam. CT scan of the brain is usually performed to help identify a TIA. Other tests may include:  Electrocardiography (ECG).  Continuous heart monitoring.  Echocardiography.  Carotid ultrasonography.  MRI.  A scan of the brain circulation.  Blood tests. TREATMENT  Since the symptoms of TIA are the same as a stroke, it is important to seek treatment as soon as possible. You may need a medicine to dissolve a blood clot (thrombolytic) if that is the cause of the TIA. This medicine cannot be given if too much time has passed. Treatment may also include:   Rest, oxygen, fluids through an IV tube, and medicines to thin the blood (anticoagulants).  Measures will be taken to prevent short-term and long-term complications, including infection from breathing foreign material into the lungs (aspiration pneumonia), blood clots in the legs, and falls.  Procedures to either remove plaque in the carotid arteries or dilate carotid arteries that have narrowed due to plaque. Those procedures are:  Carotid endarterectomy.  Carotid angioplasty and stenting.  Medicines and  diet may be used to address diabetes, high blood pressure, and other underlying risk factors. HOME CARE INSTRUCTIONS   Take medicines only as directed by your health care provider. Follow the directions carefully. Medicines may be used to control risk factors for a stroke.  Be sure you understand all your medicine instructions.  You may be told to take aspirin or the anticoagulant warfarin. Warfarin needs to be taken exactly as instructed.  Taking too much or too little warfarin is dangerous. Too much warfarin increases the risk of bleeding. Too little warfarin continues to allow the risk for blood clots. While taking warfarin, you will need to have regular blood tests to measure your blood clotting time. A PT blood test measures how long it takes for blood to clot. Your PT is used to calculate another value called an INR. Your PT and INR help your health care provider to adjust your dose of warfarin. The dose can change for many reasons. It is critically important that you take warfarin exactly as prescribed.  Many foods, especially foods high in vitamin K can interfere with warfarin and affect the PT and INR. Foods high in vitamin K include spinach, kale, broccoli, cabbage, collard and turnip greens, Brussels sprouts, peas, cauliflower, seaweed, and parsley, as well as beef and pork liver, green tea, and soybean oil. You should eat a consistent amount of foods high in vitamin K. Avoid major changes in your diet, or notify your health care provider before changing your diet. Arrange a visit with a dietitian to answer your questions.  Many medicines can interfere with warfarin and affect the PT and INR. You must tell your health care provider about any and all medicines you take; this includes all vitamins and supplements. Be especially cautious with aspirin and anti-inflammatory medicines. Do not take or discontinue any prescribed or over-the-counter medicine except on the advice of your health care provider or pharmacist.  Warfarin can have side effects, such as excessive bruising or bleeding. You will need to hold pressure over cuts for longer than usual. Your health care provider or pharmacist will discuss other potential side effects.  Avoid sports or activities that  may cause injury or bleeding.  Be careful when shaving, flossing your teeth, or handling sharp objects.  Alcohol can change the body's ability to handle warfarin. It is best to avoid alcoholic drinks or consume only very small amounts while taking warfarin. Notify your health care provider if you change your alcohol intake.  Notify your dentist or other health care providers before procedures.  Eat a diet that includes 5 or more servings of fruits and vegetables each day. This may reduce the risk of stroke. Certain diets may be prescribed to address high blood pressure, high cholesterol, diabetes, or obesity.  A diet low in sodium, saturated fat, trans fat, and cholesterol is recommended to manage high blood pressure.  A diet low in saturated fat, trans fat, and cholesterol, and high in fiber may control cholesterol levels.  A controlled-carbohydrate, controlled-sugar diet is recommended to manage diabetes.  A reduced-calorie diet that is low in sodium, saturated fat, trans fat, and cholesterol is recommended to manage obesity.  Maintain a healthy weight.  Stay physically active. It is recommended that you get at least 30 minutes of activity on most or all days.  Do not use any tobacco products, including cigarettes, chewing tobacco, or electronic cigarettes. If you need help quitting, ask your health care provider.  Limit alcohol intake to no more  than 1 drink per day for nonpregnant women and 2 drinks per day for men. One drink equals 12 ounces of beer, 5 ounces of wine, or 1 ounces of hard liquor.  Do not abuse drugs.  A safe home environment is important to reduce the risk of falls. Your health care provider may arrange for specialists to evaluate your home. Having grab bars in the bedroom and bathroom is often important. Your health care provider may arrange for equipment to be used at home, such as raised toilets and a seat for the shower.  Follow all instructions for follow-up  with your health care provider. This is very important. This includes any referrals and lab tests. Proper follow-up can prevent a stroke or another TIA from occurring. PREVENTION  The risk of a TIA can be decreased by appropriately treating high blood pressure, high cholesterol, diabetes, heart disease, and obesity, and by quitting smoking, limiting alcohol, and staying physically active. SEEK MEDICAL CARE IF:  You have personality changes.  You have difficulty swallowing.  You are seeing double.  You have dizziness.  You have a fever. SEEK IMMEDIATE MEDICAL CARE IF:  Any of the following symptoms may represent a serious problem that is an emergency. Do not wait to see if the symptoms will go away. Get medical help right away. Call your local emergency services (911 in [place] Do not drive yourself to the hospital.  You have sudden weakness or numbness of the face, arm, or leg, especially on one side of the body.  You have sudden trouble walking or difficulty moving arms or legs.  You have sudden confusion.  You have trouble speaking (aphasia) or understanding.  You have sudden trouble seeing in one or both eyes.  You have a loss of balance or coordination.  You have a sudden, severe headache with no known cause.  You have new chest pain or an irregular heartbeat.  You have a partial or total loss of consciousness. MAKE SURE YOU:   Understand these instructions.  Will watch your condition.  Will get help right away if you are not doing well or get worse.   This information is not intended to replace advice given to you by your health care provider. Make sure you discuss any questions you have with your health care provider.   Document Released: 11/05/2004 Document Revised: 02/16/2014 Document Reviewed: 05/03/2013 Elsevier Interactive Patient Education [employer].

## 2015-08-03 NOTE — ED Notes (Signed)
MD at bedside. 

## 2015-08-03 NOTE — ED Notes (Signed)
Patient transported to MRI 

## 2015-10-01 ENCOUNTER — Encounter (HOSPITAL_COMMUNITY): Payer: Self-pay | Admitting: Emergency Medicine

## 2015-10-01 ENCOUNTER — Emergency Department (HOSPITAL_COMMUNITY): Payer: Medicare Other

## 2015-10-01 ENCOUNTER — Emergency Department (HOSPITAL_COMMUNITY)
Admission: EM | Admit: 2015-10-01 | Discharge: 2015-10-01 | Disposition: A | Discharge status: Home / Self Care | Payer: Medicare Other | Attending: Emergency Medicine | Admitting: Emergency Medicine

## 2015-10-01 DIAGNOSIS — Z7982 Long term (current) use of aspirin: Secondary | ICD-10-CM | POA: Diagnosis not present

## 2015-10-01 DIAGNOSIS — Z96643 Presence of artificial hip joint, bilateral: Secondary | ICD-10-CM | POA: Diagnosis not present

## 2015-10-01 DIAGNOSIS — G459 Transient cerebral ischemic attack, unspecified: Secondary | ICD-10-CM | POA: Diagnosis not present

## 2015-10-01 DIAGNOSIS — R200 Anesthesia of skin: Secondary | ICD-10-CM | POA: Diagnosis present

## 2015-10-01 DIAGNOSIS — R531 Weakness: Secondary | ICD-10-CM | POA: Diagnosis not present

## 2015-10-01 DIAGNOSIS — I10 Essential (primary) hypertension: Secondary | ICD-10-CM | POA: Insufficient documentation

## 2015-10-01 DIAGNOSIS — Z87891 Personal history of nicotine dependence: Secondary | ICD-10-CM | POA: Insufficient documentation

## 2015-10-01 LAB — URINALYSIS, ROUTINE W REFLEX MICROSCOPIC
BILIRUBIN URINE: NEGATIVE
Glucose, UA: NEGATIVE mg/dL
Hgb urine dipstick: NEGATIVE
KETONES UR: NEGATIVE mg/dL
NITRITE: NEGATIVE
PROTEIN: NEGATIVE mg/dL
SPECIFIC GRAVITY, URINE: 1.007 (ref 1.005–1.030)
pH: 7 (ref 5.0–8.0)

## 2015-10-01 LAB — BASIC METABOLIC PANEL
Anion gap: 4 — ABNORMAL LOW (ref 5–15)
BUN: 17 mg/dL (ref 6–20)
CALCIUM: 8.6 mg/dL — AB (ref 8.9–10.3)
CHLORIDE: 105 mmol/L (ref 101–111)
CO2: 29 mmol/L (ref 22–32)
CREATININE: 0.8 mg/dL (ref 0.44–1.00)
GFR calc non Af Amer: >60 mL/min (ref >60)
Glucose, Bld: 106 mg/dL — ABNORMAL HIGH (ref 65–99)
Potassium: 4.1 mmol/L (ref 3.5–5.1)
SODIUM: 138 mmol/L (ref 135–145)

## 2015-10-01 LAB — CBC WITH DIFFERENTIAL/PLATELET
BASOS PCT: 1 %
Basophils Absolute: 0 10*3/uL (ref 0.0–0.1)
EOS ABS: 0.2 10*3/uL (ref 0.0–0.7)
Eosinophils Relative: 4 %
HCT: 36.5 % (ref 36.0–46.0)
HEMOGLOBIN: 11.1 g/dL — AB (ref 12.0–15.0)
Lymphocytes Relative: 23 %
Lymphs Abs: 1 10*3/uL (ref 0.7–4.0)
MCH: 28.3 pg (ref 26.0–34.0)
MCHC: 30.4 g/dL (ref 30.0–36.0)
MCV: 93.1 fL (ref 78.0–100.0)
MONOS PCT: 7 %
Monocytes Absolute: 0.3 10*3/uL (ref 0.1–1.0)
Neutro Abs: 2.8 10*3/uL (ref 1.7–7.7)
Neutrophils Relative %: 65 %
Platelets: 201 10*3/uL (ref 150–400)
RBC: 3.92 MIL/uL (ref 3.87–5.11)
RDW: 14.5 % (ref 11.5–15.5)
WBC: 4.3 10*3/uL (ref 4.0–10.5)

## 2015-10-01 LAB — URINE MICROSCOPIC-ADD ON: RBC / HPF: NONE SEEN RBC/hpf (ref 0–5)

## 2015-10-01 NOTE — ED Triage Notes (Signed)
[facility]- pt with left side facial numbness and left arm weakness/numbness at 1630 today. States she was starting to cook when symptoms came. HX TIA/HTN. A/O no deficits passes stroke screen.

## 2015-10-01 NOTE — ED Provider Notes (Signed)
[facility] DEPT Provider Note   CSN: [id number] Arrival date & time: 10/01/15  1733     History   Chief Complaint Chief Complaint  Patient presents with  . Numbness  . Weakness    HPI Maria [name] is a 82 y.o. female who presents following an episode of numbness and weakness of her left arm numbness of left side of her nose and mouth. Patient believe she also had slurred speech. The episode began around 4:15 PM and resolved by 4:45 or 5:00. Patient did endorse some lightheadedness following the episode, which has resolved now. Patient states she took lisinopril after her symptoms began. Patient states she is feeling back to normal now. Patient states that she has had these symptoms occur 2 times in the past year and was admitted in January for a TIA workup. Patient denies any chest pain, shortness of breath, headache, abdominal pain, nausea, vomiting, dysuria.  HPI  Past Medical History:  Diagnosis Date  . Allergic urticaria   . Arthritis    OA AND PAIN LEFT HIP  . Glaucoma   . Headache(784.0)    HX OF MIGRAINES  . Hypercholesterolemia   . Hypertension   . Osteoporosis   . Peripheral neuropathy ([place])   . PONV (postoperative nausea and vomiting)   . Rib fractures    IN THE PAST  . Shingles 1940'S    Patient Active Problem List   Diagnosis Date Noted  . TIA (transient ischemic attack) 03/12/2015  . Dependent edema 03/12/2015  . Dysphagia   . Hyperlipidemia 11/16/2014  . Essential hypertension 11/16/2014  . Postop Acute blood loss anemia 06/01/2011  . Osteoarthritis of hip 05/27/2011    Past Surgical History:  Procedure Laterality Date  . ABDOMINAL HYSTERECTOMY  1958  . APPENDECTOMY  1937  . DILATION AND CURETTAGE OF UTERUS     MULTIPLE  . EXTERNAL FIXATOR LEFT ARM FRACTURE 1997    . EYE SURGERY  1998 & 2006   BILATERAL CATARACT EXTRACTION  . JOINT REPLACEMENT  2011   RIGHT TOTAL HIP REPLACEMENT  . TONSILLECTOMY  1934  . TOTAL HIP ARTHROPLASTY   05/27/2011   Procedure: TOTAL HIP ARTHROPLASTY;  Surgeon: Gearlean Alf, MD;  Location: [facility] ORS;  Service: Orthopedics;  Laterality: Left;    OB History    No data available       Home Medications    Prior to Admission medications   Medication Sig Start Date End Date Taking? Authorizing Provider  aspirin 325 MG EC tablet Take 1 tablet (325 mg total) by mouth daily. 03/14/15   Shanker Kristeen Mans, MD  Cholecalciferol (VITAMIN D3) 2000 UNITS TABS Take 2,000 Units by mouth daily.    Historical Provider, MD  Cyanocobalamin (VITAMIN B-12 PO) Take 1 tablet by mouth daily.     Historical Provider, MD  furosemide (LASIX) 20 MG tablet Take 20 mg by mouth daily as needed for fluid.     Historical Provider, MD  metoprolol succinate (TOPROL-XL) 25 MG 24 hr tablet Take 1 tablet (25 mg total) by mouth daily with breakfast. 03/14/15   Jonetta Osgood, MD  Multiple Vitamins-Minerals (PRESERVISION AREDS PO) Take 1 tablet by mouth 2 (two) times daily.    Historical Provider, MD  VITAMIN E PO Take 1 tablet by mouth daily.     Historical Provider, MD    Family History Family History  Problem Relation Age of Onset  . Heart disease Mother     Social History Social History  Substance Use Topics  . Smoking status: Former Research scientist (life sciences)  . Smokeless tobacco: Never Used     Comment: 56 YRS AGO  . Alcohol use No     Allergies   Codeine; Tylenol [acetaminophen]; and Ibuprofen   Review of Systems Review of Systems  Constitutional: Negative for chills and fever.  HENT: Negative for facial swelling and sore throat.   Eyes: Negative for visual disturbance.  Respiratory: Negative for shortness of breath.   Cardiovascular: Negative for chest pain.  Gastrointestinal: Negative for abdominal pain, nausea and vomiting.  Genitourinary: Negative for dysuria.  Musculoskeletal: Negative for back pain.  Skin: Negative for rash and wound.  Neurological: Positive for speech difficulty (resolved), weakness (resolved),  light-headedness (resolved) and numbness (resolved). Negative for dizziness, facial asymmetry and headaches.  Psychiatric/Behavioral: The patient is not nervous/anxious.      Physical Exam Updated Vital Signs BP 173/68   Pulse (!) 55   Temp 98.6 F (37 C) (Oral)   Resp 15   SpO2 97%   Physical Exam  Constitutional: She appears well-developed and well-nourished. No distress.  HENT:  Head: Normocephalic and atraumatic.  Mouth/Throat: Oropharynx is clear and moist. No oropharyngeal exudate.  Eyes: Conjunctivae and EOM are normal. Pupils are equal, round, and reactive to light. Right eye exhibits no discharge. Left eye exhibits no discharge. No scleral icterus.  Neck: Normal range of motion. Neck supple. Carotid bruit is not present. No thyromegaly present.  Cardiovascular: Normal rate, regular rhythm, normal heart sounds and intact distal pulses.  Exam reveals no gallop and no friction rub.   No murmur heard. Pulmonary/Chest: Effort normal and breath sounds normal. No stridor. No respiratory distress. She has no wheezes. She has no rales.  Abdominal: Soft. Bowel sounds are normal. She exhibits no distension. There is no tenderness. There is no rebound and no guarding.  Musculoskeletal: She exhibits no edema.  Lymphadenopathy:    She has no cervical adenopathy.  Neurological: She is alert. Coordination normal.  CN 3-12 intact; normal sensation throughout; 5/5 strength in all 4 extremities; equal bilateral grip strength; no ataxia on finger to nose; no pronator drift  Skin: Skin is warm and dry. No rash noted. She is not diaphoretic. No pallor.  Psychiatric: She has a normal mood and affect.  Nursing note and vitals reviewed.    ED Treatments / Results  Labs (all labs ordered are listed, but only abnormal results are displayed) Labs Reviewed  URINALYSIS, ROUTINE W REFLEX MICROSCOPIC (NOT AT [facility]) - Abnormal; Notable for the following:       Result Value   APPearance CLOUDY (*)      Leukocytes, UA SMALL (*)    All other components within normal limits  URINE MICROSCOPIC-ADD ON - Abnormal; Notable for the following:    Squamous Epithelial / LPF 0-5 (*)    Bacteria, UA RARE (*)    All other components within normal limits  URINE CULTURE  CBC WITH DIFFERENTIAL/PLATELET  BASIC METABOLIC PANEL    EKG  EKG Interpretation  Date/Time:  Tuesday October 01 2015 17:38:38 EDT Ventricular Rate:  72 PR Interval:    QRS Duration: 96 QT Interval:  430 QTC Calculation: 471 R Axis:   53 Text Interpretation:  Sinus rhythm No significant change was found Confirmed by CAMPOS  MD, KEVIN ([id number]) on 10/01/2015 5:54:01 PM       Radiology No results found.  Procedures Procedures (including critical care time)  Medications Ordered in ED Medications - No data to display  Initial Impression / Assessment and Plan / ED Course  I have reviewed the triage vital signs and the nursing notes.  Pertinent labs & imaging results that were available during my care of the patient were reviewed by me and considered in my medical decision making (see chart for details).  Clinical Course    CBC, BMP, CT head pending. UA shows small leukocytes, rare bacteria. Urine culture sent. At shift change, patient care transferred to Dr. Venora Maples for continued evaluation, follow up of labs and CT head and determination of disposition. Anticipate discharge if no new findings. Follow up and establish care with neurologist.    Final Clinical Impressions(s) / ED Diagnoses   Final diagnoses:  None    New Prescriptions New Prescriptions   No medications on file     Caryl Ada 10/01/15 2030    Jola Schmidt, MD 10/01/15 2308

## 2015-10-01 NOTE — ED Notes (Signed)
Pt aware of CT delay

## 2015-10-03 LAB — URINE CULTURE: Special Requests: NORMAL

## 2015-10-09 ENCOUNTER — Ambulatory Visit (INDEPENDENT_AMBULATORY_CARE_PROVIDER_SITE_OTHER): Payer: Medicare Other | Admitting: Neurology

## 2015-10-09 ENCOUNTER — Encounter: Payer: Self-pay | Admitting: Neurology

## 2015-10-09 VITALS — BP 160/84 | HR 65 | Ht 64.0 in | Wt 171.2 lb

## 2015-10-09 DIAGNOSIS — G609 Hereditary and idiopathic neuropathy, unspecified: Secondary | ICD-10-CM

## 2015-10-09 DIAGNOSIS — G459 Transient cerebral ischemic attack, unspecified: Secondary | ICD-10-CM | POA: Diagnosis not present

## 2015-10-09 MED ORDER — CLOPIDOGREL BISULFATE 75 MG PO TABS: 75.0000 mg | ORAL_TABLET | Freq: Every day | ORAL | 11 refills | Status: DC

## 2015-10-09 NOTE — Patient Instructions (Addendum)
1.  Stop aspirin 2.  Start plavix 75mg  daily 3.  Continue diet modification cholesterol 4.  Share your blood pressure readings with your primary care provider  Return to clinic in 4 months

## 2015-10-09 NOTE — Progress Notes (Signed)
Note routed

## 2015-10-09 NOTE — Progress Notes (Signed)
[facility] Clinic Note - Initial Visit   Date: 10/09/15  [name] MRN: [id number] DOB: 09-May-1922   Dear Dr. Maudie Mercury:  Thank you for your kind referral of Maria [name] for consultation of TIA. Although her history is well known to you, please allow [place] to reiterate it for the purpose of our medical record. The patient was accompanied to the clinic by husband who also provides collateral information.     History of Present Illness: Maria [name] is an 82 y.o. right-handed female with hypertension, hyperlipidemia, neuropathy, former tobacco use, and migraines presenting for evaluation of spells of left face and arm numbness.    She reports having three stereotyped spells of left face and arm numbness since January 2017.  She has numbness around the left cheek, nose and chin and left dorsum of the hand and forearm.  She occasionally with slurred speech.  She does not have headache, vision changes, or limb weakness. She had had three distinct spells in January, June, and August 2017.  She had stroke work-up in January 2017 which showed likely peripheral artifact over the parietal-occipital junction, less likely tiny infarct, on my review.  She has moderate intracranial stenosis involving left ACA and and high-grade stenosis of the right proximal P2 and distal left P2.  She was started on aspirin 325mg  daily for secondary prevention.  She was evaluated by the stroke team who felt symptoms were more suggestive of complex migraine.  Since this time, she had two additional spells again with focal left face and arm numbness, lasting < 1 hour.  MRI brain in June and CT head in August did not show acute stroke.    She also has history neuropathy for about the past 5 years manifesting with tingling involving the feet. Symptoms are not bothersome enough to take any medications.  She endorses mild imbalance.   She walks with a cane since she had bilateral hip  replacement (2011, 2014).  She had not suffered any falls.  She used to have migraines but this resolved after menopause.   Out-side paper records, electronic medical record, and images have been reviewed where available and summarized as:  CT head 10/01/2015:  No evidence of acute intracranial abnormality.  Atrophy.  Small left cerebellar infarct, chronic.  MRI brain wo contrast 08/03/2015:   No acute intracranial process.  New nonacute LEFT temporal microhemorrhage versus artifact. Minimal chronic small vessel ischemic disease. Small LEFT cerebellar infarct.  MRI brain wwo contrast 03/12/2015:  Tiny acute infarct versus artifact peripheral aspect right parietal-occipital lobe junction. Otherwise no evidence of acute infarct. Remote small infarct occipital lobes. Mild to moderate chronic microvascular ischemic changes. No intracranial hemorrhage. No intracranial mass or abnormal enhancement. Global moderate atrophy without hydrocephalus.  MRA HEAD Mild to moderate narrowing anterior aspect left internal carotid artery cavernous segment. Mild narrowing supraclinoid segment of the internal carotid artery bilaterally.  Mild to moderate focal stenosis junction A1 and A2 segment left anterior cerebral artery.  Middle cerebral artery branch vessel mild to moderate irregularity narrowing with decrease number of visualized right middle cerebral are branches compared to the left. No significant stenosis of the M1 segment or carotid terminus on either side.  Ectatic vertebral arteries with mild narrowing on the right. Nonvisualized right posterior inferior cerebellar artery and left anterior inferior cerebellar artery. No high-grade stenosis of the basilar artery. High-grade stenosis proximal P2 segment right posterior cerebral artery. Moderate narrowing distal P2 segment left posterior cerebral artery. Posterior  cerebral artery distal branch vessel narrowing and irregularity bilaterally.  MRA  NECK Plaque with narrowing proximal right internal carotid artery (less than 50%). Ectasia of the right internal carotid artery beyond this region with slight irregularity. Ectatic left internal carotid artery without significant stenosis. Mild to moderate narrowing proximal aspect of the vertebral arteries bilaterally. Mild narrowing proximal subclavian arteries bilaterally.  Past Medical History:  Diagnosis Date  . Allergic urticaria   . Arthritis    OA AND PAIN LEFT HIP  . Glaucoma   . Headache(784.0)    HX OF MIGRAINES  . Hypercholesterolemia   . Hypertension   . Osteoporosis   . Peripheral neuropathy ([place])   . PONV (postoperative nausea and vomiting)   . Rib fractures    IN THE PAST  . Shingles 1940'S    Past Surgical History:  Procedure Laterality Date  . ABDOMINAL HYSTERECTOMY  1958  . APPENDECTOMY  1937  . DILATION AND CURETTAGE OF UTERUS     MULTIPLE  . EXTERNAL FIXATOR LEFT ARM FRACTURE 1997    . EYE SURGERY  1998 & 2006   BILATERAL CATARACT EXTRACTION  . JOINT REPLACEMENT  2011   RIGHT TOTAL HIP REPLACEMENT  . TONSILLECTOMY  1934  . TOTAL HIP ARTHROPLASTY  05/27/2011   Procedure: TOTAL HIP ARTHROPLASTY;  Surgeon: Gearlean Alf, MD;  Location: [facility];  Service: Orthopedics;  Laterality: Left;     Medications:  Outpatient Encounter Prescriptions as of 10/09/2015  Medication Sig Note  . aspirin 325 MG EC tablet Take 1 tablet (325 mg total) by mouth daily.   . Cholecalciferol (VITAMIN D3) 2000 UNITS TABS Take 2,000 Units by mouth daily.   . Cyanocobalamin (VITAMIN B-12 PO) Take 1 tablet by mouth daily.    . furosemide (LASIX) 20 MG tablet Take 20 mg by mouth daily as needed for fluid.    Marland Kitchen lisinopril (PRINIVIL,ZESTRIL) 40 MG tablet  10/09/2015: Received from: External Pharmacy  . metoprolol succinate (TOPROL-XL) 25 MG 24 hr tablet Take 1 tablet (25 mg total) by mouth daily with breakfast.   . Multiple Vitamins-Minerals (PRESERVISION AREDS PO) Take 1 tablet by  mouth 2 (two) times daily.   . [DISCONTINUED] lisinopril (PRINIVIL,ZESTRIL) 20 MG tablet Take 20 mg by mouth daily.    No facility-administered encounter medications on file as of 10/09/2015.      Allergies:  Allergies  Allergen Reactions  . Codeine Nausea And Vomiting  . Tylenol [Acetaminophen]     ITCHING  . Ibuprofen Itching and Rash    Family History: Family History  Problem Relation Age of Onset  . Heart disease Mother   . Stroke Mother   . Other Father 80    MVA  . Stroke Sister   . Stroke Brother   . Atrial fibrillation Son   . Other Son 22    fire    Social History: Social History  Substance Use Topics  . Smoking status: Former Research scientist (life sciences)  . Smokeless tobacco: Never Used     Comment: 1 YRS AGO  . Alcohol use No   Social History   Social History Narrative   Lives with husband in a one story home.  Has 1 son.  Retired from working in Insurance underwriter.  Education: some college.    Review of Systems:  CONSTITUTIONAL: No fevers, chills, night sweats, or weight loss.   EYES: No visual changes or eye pain ENT: No hearing changes.  No history of nose bleeds.   RESPIRATORY: No cough, wheezing  and shortness of breath.   CARDIOVASCULAR: Negative for chest pain, and palpitations.   GI: Negative for abdominal discomfort, blood in stools or black stools.  No recent change in bowel habits.   GU:  No history of incontinence.   MUSCLOSKELETAL: No history of joint pain or swelling.  No myalgias.   SKIN: Negative for lesions, rash, and itching.   HEMATOLOGY/ONCOLOGY: Negative for prolonged bleeding, bruising easily, and swollen nodes.  No history of cancer.   ENDOCRINE: Negative for cold or heat intolerance, polydipsia or goiter.   PSYCH:  No depression or anxiety symptoms.   NEURO: As Above.   Vital Signs:  BP (!) 160/84   Pulse 65   Ht 5\' 4"  (1.626 m)   Wt 171 lb 4 oz (77.7 kg)   SpO2 97%   BMI 29.39 kg/m   General Medical Exam:   General:  Well appearing,  comfortable.   Eyes/ENT: see cranial nerve examination.   Neck: No masses appreciated.  Full range of motion without tenderness.  No carotid bruits. Respiratory:  Clear to auscultation, good air entry bilaterally.   Cardiac:  Regular rate and rhythm, no murmur.   Extremities:  No deformities, edema, or skin discoloration.  Skin:  No rashes or lesions.  Neurological Exam: MENTAL STATUS including orientation to time, place, person, recent and remote memory, attention span and concentration, language, and fund of knowledge is normal.  Speech is not dysarthric.  CRANIAL NERVES: II:  No visual field defects.  Unremarkable fundi.   III-IV-VI: Pupils equal round and reactive to light.  Normal conjugate, extra-ocular eye movements in all directions of gaze.  No nystagmus.  No ptosis.   V:  Normal facial sensation.    VII:  Normal facial symmetry and movements.  No pathologic facial reflexes.  VIII:  Normal hearing and vestibular function.   IX-X:  Normal palatal movement.   XI:  Normal shoulder shrug and head rotation.   XII:  Normal tongue strength and range of motion.  Tongue is deviated to the right (old).  MOTOR:  No atrophy, fasciculations or abnormal movements.  No pronator drift.  Tone is normal.    Right Upper Extremity:    Left Upper Extremity:    Deltoid  5/5   Deltoid  5/5   Biceps  5/5   Biceps  5/5   Triceps  5/5   Triceps  5/5   Wrist extensors  5/5   Wrist extensors  5/5   Wrist flexors  5/5   Wrist flexors  5/5   Finger extensors  5/5   Finger extensors  5/5   Finger flexors  5/5   Finger flexors  5/5   Dorsal interossei  5/5   Dorsal interossei  5/5   Abductor pollicis  5/5   Abductor pollicis  5/5   Tone (Ashworth scale)  0  Tone (Ashworth scale)  0   Right Lower Extremity:    Left Lower Extremity:    Hip flexors  5/5   Hip flexors  5/5   Hip extensors  5/5   Hip extensors  5/5   Knee flexors  5/5   Knee flexors  5/5   Knee extensors  5/5   Knee extensors  5/5     Dorsiflexors  5/5   Dorsiflexors  5/5   Plantarflexors  5/5   Plantarflexors  5/5   Toe extensors  5/5   Toe extensors  5/5   Toe flexors  5/5   Toe  flexors  5/5   Tone (Ashworth scale)  0  Tone (Ashworth scale)  0   MSRs:  Right                                                                 Left brachioradialis 2+  brachioradialis 2+  biceps 2+  biceps 2+  triceps 2+  triceps 2+  patellar 2+  patellar 2+  ankle jerk 0  ankle jerk 0  Hoffman no  Hoffman no  plantar response down  plantar response down   SENSORY:  Reduced vibration and temperature distal to ankles bilaterally.  Pin prick is intact throughout.   Romberg's sign is present.   COORDINATION/GAIT: Normal finger-to- nose-finger and heel-to-shin.  Intact rapid alternating movements bilaterally.  Able to rise from a chair without using arms.  Gait slightly wide-based, stable, and unassisted.  She is unable to perform stressed gait or tandem gait.   IMPRESSION: 1.  Stereotyped spells of left face and arm numbness lasting < 1 hour.    - The possibilities include TIA vs complex migraine and less likely seizure.    - MRI negative in the past with these spell, but her family history of very strong for ischemic stroke and there is evidence of intracranial stenosis, I will manage her as possible TIA.  Since she has continued to have events on aspirin, I will switch her to plavix 75mg  daily  - She will be following up with her PCP tomorrow to discuss blood pressure management as this has been fluctuating  - She is intolerant of statin, so is trying to control cholesterol with diet and exercise  - Stroke warning signs discussed  - If this was complex migraine, no treatment indicated as they spontaneously resolve and are not associated with headaches 2.  Idiopathic peripheral neuropathy, stable and not bothersome enough to start medication   Return to clinic in 4 months.   The duration of this appointment visit was 50 minutes of  face-to-face time with the patient.  Greater than 50% of this time was spent in counseling, explanation of diagnosis, planning of further management, and coordination of care.   Thank you for allowing me to participate in patient's care.  If I can answer any additional questions, I would be pleased to do so.    Sincerely,    Donika K. Posey Pronto, DO

## 2016-03-06 ENCOUNTER — Ambulatory Visit (INDEPENDENT_AMBULATORY_CARE_PROVIDER_SITE_OTHER): Payer: Medicare Other | Admitting: Neurology

## 2016-03-06 ENCOUNTER — Encounter: Payer: Self-pay | Admitting: Neurology

## 2016-03-06 VITALS — BP 144/84 | HR 76 | Ht 64.0 in | Wt 170.0 lb

## 2016-03-06 DIAGNOSIS — G459 Transient cerebral ischemic attack, unspecified: Secondary | ICD-10-CM

## 2016-03-06 DIAGNOSIS — G609 Hereditary and idiopathic neuropathy, unspecified: Secondary | ICD-10-CM | POA: Diagnosis not present

## 2016-03-06 DIAGNOSIS — R252 Cramp and spasm: Secondary | ICD-10-CM | POA: Diagnosis not present

## 2016-03-06 NOTE — Patient Instructions (Signed)
Continue plavix 75mg daily  Return to clinic in 6 months 

## 2016-03-06 NOTE — Progress Notes (Signed)
Follow-up Visit   Date: 03/06/16  Maria [name] MRN: [id number] DOB: 1922/09/16   Interim History: Maria [name] is a 83 y.o. right-handed female with hypertension, hyperlipidemia, neuropathy, former tobacco use, and migraines returning to the clinic for follow-up of TIA.  The patient was accompanied to the clinic by self.  History of present illness: She reports having three stereotyped spells of left face and arm numbness since January 2017.  She has numbness around the left cheek, nose and chin and left dorsum of the hand and forearm.  She occasionally with slurred speech.  She does not have headache, vision changes, or limb weakness. She had had three distinct spells in January, June, and August 2017.  She had stroke work-up in January 2017 which showed likely peripheral artifact over the parietal-occipital junction, less likely tiny infarct, on my review.  She has moderate intracranial stenosis involving left ACA and and high-grade stenosis of the right proximal P2 and distal left P2.  She was started on aspirin 325mg  daily for secondary prevention.  She was evaluated by the stroke team who felt symptoms were more suggestive of complex migraine.  Since this time, she had two additional spells again with focal left face and arm numbness, lasting < 1 hour.  MRI brain in June and CT head in August did not show acute stroke.    She also has history neuropathy for about the past 5 years manifesting with tingling involving the feet. Symptoms are not bothersome enough to take any medications.  She endorses mild imbalance.   She walks with a cane since she had bilateral hip replacement (2011, 2014).  She had not suffered any falls.  She used to have migraines but this resolved after menopause.  UPDATE 03/06/2016:    She has not had any more spells of left face or leg numbness.  She endorses some mild memory changes, such as remembering people names.  She is highly independent  and has self restricted her driving to her neighborhood only.  She manages her own finances with difficulty, cooks, and cleans her home.  She complains of dry mouth and hoarseness which started after taking a muscle relaxant for leg cramps.  Her medication list includes both flexeril and Robaxin but she does not know which one she is taking.  Her blood pressure has been slightly elevated, staying SBP 130-150s.  She denies any headaches or new neurological complaints.   Medications:  Current Outpatient Prescriptions on File Prior to Visit  Medication Sig Dispense Refill  . Cholecalciferol (VITAMIN D3) 2000 UNITS TABS Take 2,000 Units by mouth daily.    . clopidogrel (PLAVIX) 75 MG tablet Take 1 tablet (75 mg total) by mouth daily. 30 tablet 11  . Cyanocobalamin (VITAMIN B-12 PO) Take 1 tablet by mouth daily.     . metoprolol succinate (TOPROL-XL) 25 MG 24 hr tablet Take 1 tablet (25 mg total) by mouth daily with breakfast. 30 tablet 0  . Multiple Vitamins-Minerals (PRESERVISION AREDS PO) Take 1 tablet by mouth 2 (two) times daily.    . furosemide (LASIX) 20 MG tablet Take 20 mg by mouth daily as needed for fluid.      No current facility-administered medications on file prior to visit.     Allergies:  Allergies  Allergen Reactions  . Codeine Nausea And Vomiting  . Tylenol [Acetaminophen]     ITCHING  . Ibuprofen Itching and Rash    Review of Systems:  CONSTITUTIONAL: No fevers, chills,  night sweats, or weight loss.  EYES: No visual changes or eye pain +dry mouth ENT: No hearing changes.  No history of nose bleeds.   RESPIRATORY: No cough, wheezing and shortness of breath.   CARDIOVASCULAR: Negative for chest pain, and palpitations.   GI: Negative for abdominal discomfort, blood in stools or black stools.  No recent change in bowel habits.   GU:  No history of incontinence.   MUSCLOSKELETAL: No history of joint pain or swelling.  No myalgias.   SKIN: Negative for lesions, rash, and  itching.   ENDOCRINE: Negative for cold or heat intolerance, polydipsia or goiter.   PSYCH:  No depression or anxiety symptoms.   NEURO: As Above.   Vital Signs:  BP (!) 144/84   Pulse 76   Ht 5\' 4"  (1.626 m)   Wt 170 lb (77.1 kg)   SpO2 94%   BMI 29.18 kg/m   Neurological Exam: MENTAL STATUS including orientation to time, place, person, recent and remote memory, attention span and concentration, language, and fund of knowledge is normal.    CRANIAL NERVES: Pupils equal round and reactive to light.  Normal conjugate, extra-ocular eye movements in all directions of gaze.  No ptosis. Normal facial sensation.  Face is symmetric. Palate elevates symmetrically.  Tongue is midline.  MOTOR:  Motor strength is 5/5 in all extremities.  No pronator drift.  Tone is normal.    MSRs:  Reflexes are 2+/4 throughout, except absent Achilles.  SENSORY:  Reduced temperature and vibration distal to ankles only.  COORDINATION/GAIT:   Gait mildly-wide based, stable.   Data: CT head 10/01/2015:  No evidence of acute intracranial abnormality.  Atrophy. Small left cerebellar infarct, chronic.  MRI brain wo contrast 08/03/2015:   No acute intracranial process.  New nonacute LEFT temporal microhemorrhage versus artifact. Minimal chronic small vessel ischemic disease. Small LEFT cerebellar infarct.  MRI brain wwo contrast 03/12/2015:  Tiny acute infarct versus artifact peripheral aspect right parietal-occipital lobe junction. Otherwise no evidence of acute infarct. Remote small infarct occipital lobes. Mild to moderate chronic microvascular ischemic changes. No intracranial hemorrhage. No intracranial mass or abnormal enhancement. Global moderate atrophy without hydrocephalus.  MRA HEAD Mild to moderate narrowing anterior aspect left internal carotid artery cavernous segment. Mild narrowing supraclinoid segment of the internal carotid artery bilaterally.  Mild to moderate focal stenosis junction A1  and A2 segment left anterior cerebral artery.  Middle cerebral artery branch vessel mild to moderate irregularity narrowing with decrease number of visualized right middle cerebral are branches compared to the left. No significant stenosis of the M1 segment or carotid terminus on either side.  Ectatic vertebral arteries with mild narrowing on the right. Nonvisualized right posterior inferior cerebellar artery and left anterior inferior cerebellar artery. No high-grade stenosis of the basilar artery. High-grade stenosis proximal P2 segment right posterior cerebral artery. Moderate narrowing distal P2 segment left posterior cerebral artery. Posterior cerebral artery distal branch vessel narrowing and irregularity bilaterally.  MRA NECK Plaque with narrowing proximal right internal carotid artery (less than 50%). Ectasia of the right internal carotid artery beyond this region with slight irregularity. Ectatic left internal carotid artery without significant stenosis. Mild to moderate narrowing proximal aspect of the vertebral arteries bilaterally. Mild narrowing proximal subclavian arteries bilaterally.  IMPRESSION/PLAN: 1.  TIA manifesting with left face and arm numbness (2017), less likely complex migraine  - Clinically doing well, asymptomatic             - MRI negative in the  past with these spell, but her family history of very strong for ischemic stroke and there is evidence of intracranial stenosis, it was decided to treat symptoms as TIA and because she had continued spells on aspirin, she was switched to plavix in 09/2015  - Since starting plavix 75mg  daily she has not had any further spells.   2.  Idiopathic peripheral neuropathy, stable and does not cause any pain  - Fall precautions discussed  3.  Muscle cramps  - She reports having adverse effect with one of her muscle relaxants but does not know whether it is robaxin or flexeril  - Recommend that she stop the one causing  her dry mouth and start the alternative, but she wants to talk to her PCP about this first  - Encouraged her to stay well hydrated and stretch daily  4.  Mild cognitive impairment, age-appropriate.  No signs of dementia.  Return to clinic in 6 months  The duration of this appointment visit was 25 minutes of face-to-face time with the patient.  Greater than 50% of this time was spent in counseling, explanation of diagnosis, planning of further management, and coordination of care.   Thank you for allowing me to participate in patient's care.  If I can answer any additional questions, I would be pleased to do so.    Sincerely,    Donika K. Posey Pronto, DO

## 2016-08-05 ENCOUNTER — Encounter: Payer: Self-pay | Admitting: Neurology

## 2016-08-05 ENCOUNTER — Ambulatory Visit (INDEPENDENT_AMBULATORY_CARE_PROVIDER_SITE_OTHER): Payer: Medicare Other | Admitting: Neurology

## 2016-08-05 VITALS — BP 120/80 | HR 62 | Ht 64.0 in | Wt 170.1 lb

## 2016-08-05 DIAGNOSIS — G609 Hereditary and idiopathic neuropathy, unspecified: Secondary | ICD-10-CM

## 2016-08-05 DIAGNOSIS — G459 Transient cerebral ischemic attack, unspecified: Secondary | ICD-10-CM | POA: Diagnosis not present

## 2016-08-05 NOTE — Progress Notes (Signed)
Follow-up Visit   Date: 08/05/16  Maria [name] MRN: [id number] DOB: [birth year]   Interim History: Maria [name] is a 83 y.o. right-handed female with hypertension, hyperlipidemia, neuropathy, former tobacco use, and migraines returning to the clinic for follow-up of TIA.  The patient was accompanied to the clinic by self.  History of present illness: She reports having three stereotyped spells of left face and arm numbness since January 2017.  She has numbness around the left cheek, nose and chin and left dorsum of the hand and forearm.  She occasionally with slurred speech.  She does not have headache, vision changes, or limb weakness. She had had three distinct spells in January, June, and August 2017.  She had stroke work-up in January 2017 which showed likely peripheral artifact over the parietal-occipital junction, less likely tiny infarct, on my review.  She has moderate intracranial stenosis involving left ACA and and high-grade stenosis of the right proximal P2 and distal left P2.  She was started on aspirin 325mg  daily for secondary prevention.  She was evaluated by the stroke team who felt symptoms were more suggestive of complex migraine.  Since this time, she had two additional spells again with focal left face and arm numbness, lasting < 1 hour.  MRI brain in June and CT head in August did not show acute stroke.    She also has history neuropathy for about the past 5 years manifesting with tingling involving the feet. Symptoms are not bothersome enough to take any medications.  She endorses mild imbalance.   She walks with a cane since she had bilateral hip replacement (2011, 2014).  She had not suffered any falls.  She used to have migraines but this resolved after menopause.  UPDATE 03/06/2016:    She has not had any more spells of left face or leg numbness.  She endorses some mild memory changes, such as remembering people names.  She is highly independent  and has self restricted her driving to her neighborhood only.  She manages her own finances with difficulty, cooks, and cleans her home.  She complains of dry mouth and hoarseness which started after taking a muscle relaxant for leg cramps.  Her medication list includes both flexeril and Robaxin but she does not know which one she is taking.  Her blood pressure has been slightly elevated, staying SBP 130-150s.  She denies any headaches or new neurological complaints.   UPDATE 08/05/2016:  She is here for 6 month appointment.  There have been no new neurological issues since her last visit.  She denies any numbness/tinging, weakness, or vision changes.  She stay active and goes to the [facility] daily to walk an hour.  She continues to do her own IADLs and enjoys quilting. She is no longer having muscle cramps.  She complains of a dry cough since starting lisinopril and will discuss this with her PCP.  She suffered one fall several months ago while carrying some pastries in her hands, so could not use her cane.  Fortunately, she did not hurt herself.  Medications:  Current Outpatient Prescriptions on File Prior to Visit  Medication Sig Dispense Refill  . Cholecalciferol (VITAMIN D3) 2000 UNITS TABS Take 2,000 Units by mouth daily.    . clopidogrel (PLAVIX) 75 MG tablet Take 1 tablet (75 mg total) by mouth daily. 30 tablet 11  . Cyanocobalamin (VITAMIN B-12 PO) Take 1 tablet by mouth daily.     Marland Kitchen lisinopril (PRINIVIL,ZESTRIL) 20 MG  tablet     . metoprolol succinate (TOPROL-XL) 25 MG 24 hr tablet Take 1 tablet (25 mg total) by mouth daily with breakfast. 30 tablet 0  . Multiple Vitamins-Minerals (PRESERVISION AREDS PO) Take 1 tablet by mouth 2 (two) times daily.    Marland Kitchen triamcinolone cream (KENALOG) 0.5 %      No current facility-administered medications on file prior to visit.     Allergies:  Allergies  Allergen Reactions  . Codeine Nausea And Vomiting  . Tylenol [Acetaminophen]     ITCHING  . Ibuprofen  Itching and Rash    Review of Systems:  CONSTITUTIONAL: No fevers, chills, night sweats, or weight loss.  EYES: No visual changes or eye pain +dry mouth ENT: No hearing changes.  No history of nose bleeds.   RESPIRATORY: +cough, wheezing and shortness of breath.   CARDIOVASCULAR: Negative for chest pain, and palpitations.   GI: Negative for abdominal discomfort, blood in stools or black stools.  No recent change in bowel habits.   GU:  No history of incontinence.   MUSCLOSKELETAL: No history of joint pain or swelling.  No myalgias.   SKIN: Negative for lesions, rash, and itching.   ENDOCRINE: Negative for cold or heat intolerance, polydipsia or goiter.   PSYCH:  No depression or anxiety symptoms.   NEURO: As Above.   Vital Signs:  BP 120/80   Pulse 62   Ht 5\' 4"  (1.626 m)   Wt 170 lb 2 oz (77.2 kg)   SpO2 97%   BMI 29.20 kg/m   Neurological Exam: MENTAL STATUS including orientation to time, place, person, recent and remote memory, attention span and concentration, language, and fund of knowledge is normal.    CRANIAL NERVES: Pupils equal round and reactive to light.  Normal conjugate, extra-ocular eye movements in all directions of gaze.  No ptosis. Normal facial sensation.  Face is symmetric. Palate elevates symmetrically.  Tongue is midline.  MOTOR:  Motor strength is 5/5 in all extremities.  No pronator drift.  Tone is normal.    SENSORY:  Reduced temperature and vibration distal to ankles only.  COORDINATION/GAIT:   Gait mildly-wide based, stable unassisted.   Data: CT head 10/01/2015:  No evidence of acute intracranial abnormality.  Atrophy. Small left cerebellar infarct, chronic.  MRI brain wo contrast 08/03/2015:   No acute intracranial process.  New nonacute LEFT temporal microhemorrhage versus artifact. Minimal chronic small vessel ischemic disease. Small LEFT cerebellar infarct.  MRI brain wwo contrast 03/12/2015:  Tiny acute infarct versus artifact peripheral  aspect right parietal-occipital lobe junction. Otherwise no evidence of acute infarct. Remote small infarct occipital lobes. Mild to moderate chronic microvascular ischemic changes. No intracranial hemorrhage. No intracranial mass or abnormal enhancement. Global moderate atrophy without hydrocephalus.  MRA HEAD Mild to moderate narrowing anterior aspect left internal carotid artery cavernous segment. Mild narrowing supraclinoid segment of the internal carotid artery bilaterally.  Mild to moderate focal stenosis junction A1 and A2 segment left anterior cerebral artery.  Middle cerebral artery branch vessel mild to moderate irregularity narrowing with decrease number of visualized right middle cerebral are branches compared to the left. No significant stenosis of the M1 segment or carotid terminus on either side.  Ectatic vertebral arteries with mild narrowing on the right. Nonvisualized right posterior inferior cerebellar artery and left anterior inferior cerebellar artery. No high-grade stenosis of the basilar artery. High-grade stenosis proximal P2 segment right posterior cerebral artery. Moderate narrowing distal P2 segment left posterior cerebral artery. Posterior cerebral  artery distal branch vessel narrowing and irregularity bilaterally.  MRA NECK Plaque with narrowing proximal right internal carotid artery (less than 50%). Ectasia of the right internal carotid artery beyond this region with slight irregularity. Ectatic left internal carotid artery without significant stenosis. Mild to moderate narrowing proximal aspect of the vertebral arteries bilaterally. Mild narrowing proximal subclavian arteries bilaterally.  IMPRESSION/PLAN: 1.  TIA manifesting with left face and arm numbness (2017).  Clinically doing well with no further spells             - MRI negative in the past with these spell, but her family history of very strong for ischemic stroke and there is evidence of  intracranial stenosis, it was decided to treat symptoms as TIA and because she had continued spells on aspirin, she was switched to plavix in 09/2015  - Since starting plavix 75mg  daily she has not had any further spells.   2.  Idiopathic peripheral neuropathy, stable and does not cause any pain  - Fall precautions discussed, she uses a cane   3.  Muscle cramps - resolved  4.  Mild cognitive impairment, age-appropriate.  No signs of dementia.  Return to clinic in 1 year  The duration of this appointment visit was 20 minutes of face-to-face time with the patient.  Greater than 50% of this time was spent in counseling, explanation of diagnosis, planning of further management, and coordination of care.   Thank you for allowing me to participate in patient's care.  If I can answer any additional questions, I would be pleased to do so.    Sincerely,    Donika K. Posey Pronto, DO

## 2016-08-05 NOTE — Patient Instructions (Addendum)
Continue plavix 75mg  daily You may want to consider keeping a rollator to help with balance Return to clinic in 1 year

## 2016-09-04 ENCOUNTER — Ambulatory Visit: Payer: Medicare Other | Admitting: Neurology

## 2016-09-24 ENCOUNTER — Other Ambulatory Visit: Payer: Self-pay | Admitting: Neurology

## 2016-11-06 ENCOUNTER — Observation Stay (HOSPITAL_COMMUNITY)
Admission: EM | Admit: 2016-11-06 | Discharge: 2016-11-08 | Disposition: A | Discharge status: Home / Self Care | Payer: Medicare Other | Attending: Internal Medicine | Admitting: Internal Medicine

## 2016-11-06 ENCOUNTER — Emergency Department (HOSPITAL_COMMUNITY): Payer: Medicare Other

## 2016-11-06 ENCOUNTER — Encounter (HOSPITAL_COMMUNITY): Payer: Self-pay | Admitting: Emergency Medicine

## 2016-11-06 DIAGNOSIS — R531 Weakness: Secondary | ICD-10-CM | POA: Insufficient documentation

## 2016-11-06 DIAGNOSIS — Z79899 Other long term (current) drug therapy: Secondary | ICD-10-CM | POA: Insufficient documentation

## 2016-11-06 DIAGNOSIS — M1990 Unspecified osteoarthritis, unspecified site: Secondary | ICD-10-CM | POA: Diagnosis not present

## 2016-11-06 DIAGNOSIS — R739 Hyperglycemia, unspecified: Secondary | ICD-10-CM | POA: Insufficient documentation

## 2016-11-06 DIAGNOSIS — R2681 Unsteadiness on feet: Secondary | ICD-10-CM | POA: Diagnosis not present

## 2016-11-06 DIAGNOSIS — D649 Anemia, unspecified: Secondary | ICD-10-CM | POA: Insufficient documentation

## 2016-11-06 DIAGNOSIS — R200 Anesthesia of skin: Secondary | ICD-10-CM

## 2016-11-06 DIAGNOSIS — Z6830 Body mass index (BMI) 30.0-30.9, adult: Secondary | ICD-10-CM | POA: Diagnosis not present

## 2016-11-06 DIAGNOSIS — Z87891 Personal history of nicotine dependence: Secondary | ICD-10-CM | POA: Diagnosis not present

## 2016-11-06 DIAGNOSIS — G459 Transient cerebral ischemic attack, unspecified: Principal | ICD-10-CM | POA: Diagnosis present

## 2016-11-06 DIAGNOSIS — Z7982 Long term (current) use of aspirin: Secondary | ICD-10-CM | POA: Diagnosis not present

## 2016-11-06 DIAGNOSIS — Z823 Family history of stroke: Secondary | ICD-10-CM | POA: Diagnosis not present

## 2016-11-06 DIAGNOSIS — I10 Essential (primary) hypertension: Secondary | ICD-10-CM | POA: Diagnosis present

## 2016-11-06 DIAGNOSIS — Z7902 Long term (current) use of antithrombotics/antiplatelets: Secondary | ICD-10-CM | POA: Insufficient documentation

## 2016-11-06 DIAGNOSIS — Z886 Allergy status to analgesic agent status: Secondary | ICD-10-CM | POA: Insufficient documentation

## 2016-11-06 DIAGNOSIS — G629 Polyneuropathy, unspecified: Secondary | ICD-10-CM | POA: Diagnosis not present

## 2016-11-06 DIAGNOSIS — R202 Paresthesia of skin: Secondary | ICD-10-CM

## 2016-11-06 DIAGNOSIS — E785 Hyperlipidemia, unspecified: Secondary | ICD-10-CM | POA: Diagnosis not present

## 2016-11-06 DIAGNOSIS — E669 Obesity, unspecified: Secondary | ICD-10-CM | POA: Diagnosis not present

## 2016-11-06 DIAGNOSIS — I160 Hypertensive urgency: Secondary | ICD-10-CM

## 2016-11-06 DIAGNOSIS — M6281 Muscle weakness (generalized): Secondary | ICD-10-CM | POA: Insufficient documentation

## 2016-11-06 LAB — DIFFERENTIAL
BASOS ABS: 0 10*3/uL (ref 0.0–0.1)
Basophils Relative: 1 %
Eosinophils Absolute: 0.2 10*3/uL (ref 0.0–0.7)
Eosinophils Relative: 7 %
Lymphocytes Relative: 29 %
Lymphs Abs: 1 10*3/uL (ref 0.7–4.0)
Monocytes Absolute: 0.4 10*3/uL (ref 0.1–1.0)
Monocytes Relative: 11 %
NEUTROS ABS: 1.9 10*3/uL (ref 1.7–7.7)
Neutrophils Relative %: 52 %

## 2016-11-06 LAB — I-STAT CHEM 8, ED
BUN: 24 mg/dL — ABNORMAL HIGH (ref 6–20)
CHLORIDE: 103 mmol/L (ref 101–111)
Calcium, Ion: 1.04 mmol/L — ABNORMAL LOW (ref 1.15–1.40)
Creatinine, Ser: 0.9 mg/dL (ref 0.44–1.00)
Glucose, Bld: 113 mg/dL — ABNORMAL HIGH (ref 65–99)
HCT: 37 % (ref 36.0–46.0)
HEMOGLOBIN: 12.6 g/dL (ref 12.0–15.0)
POTASSIUM: 4.3 mmol/L (ref 3.5–5.1)
SODIUM: 140 mmol/L (ref 135–145)
TCO2: 29 mmol/L (ref 22–32)

## 2016-11-06 LAB — CBC
HCT: 36.7 % (ref 36.0–46.0)
Hemoglobin: 11.7 g/dL — ABNORMAL LOW (ref 12.0–15.0)
MCH: 29.5 pg (ref 26.0–34.0)
MCHC: 31.9 g/dL (ref 30.0–36.0)
MCV: 92.7 fL (ref 78.0–100.0)
PLATELETS: 187 10*3/uL (ref 150–400)
RBC: 3.96 MIL/uL (ref 3.87–5.11)
RDW: 14.1 % (ref 11.5–15.5)
WBC: 3.5 10*3/uL — AB (ref 4.0–10.5)

## 2016-11-06 LAB — COMPREHENSIVE METABOLIC PANEL
ALBUMIN: 3.8 g/dL (ref 3.5–5.0)
ALK PHOS: 79 U/L (ref 38–126)
ALT: 13 U/L — ABNORMAL LOW (ref 14–54)
AST: 29 U/L (ref 15–41)
Anion gap: 5 (ref 5–15)
BILIRUBIN TOTAL: 0.5 mg/dL (ref 0.3–1.2)
BUN: 19 mg/dL (ref 6–20)
CALCIUM: 8.6 mg/dL — AB (ref 8.9–10.3)
CO2: 28 mmol/L (ref 22–32)
CREATININE: 0.9 mg/dL (ref 0.44–1.00)
Chloride: 104 mmol/L (ref 101–111)
GFR calc Af Amer: >60 mL/min (ref >60)
GFR, EST NON AFRICAN AMERICAN: 54 mL/min — AB (ref >60)
GLUCOSE: 115 mg/dL — AB (ref 65–99)
Potassium: 4.4 mmol/L (ref 3.5–5.1)
Sodium: 137 mmol/L (ref 135–145)
TOTAL PROTEIN: 6.1 g/dL — AB (ref 6.5–8.1)

## 2016-11-06 LAB — URINALYSIS, ROUTINE W REFLEX MICROSCOPIC
Bilirubin Urine: NEGATIVE
GLUCOSE, UA: NEGATIVE mg/dL
Hgb urine dipstick: NEGATIVE
KETONES UR: NEGATIVE mg/dL
LEUKOCYTES UA: NEGATIVE
NITRITE: NEGATIVE
PROTEIN: NEGATIVE mg/dL
Specific Gravity, Urine: 1.006 (ref 1.005–1.030)
pH: 7 (ref 5.0–8.0)

## 2016-11-06 LAB — RAPID URINE DRUG SCREEN, HOSP PERFORMED
Amphetamines: NOT DETECTED
BARBITURATES: NOT DETECTED
Benzodiazepines: NOT DETECTED
COCAINE: NOT DETECTED
Opiates: NOT DETECTED
TETRAHYDROCANNABINOL: NOT DETECTED

## 2016-11-06 LAB — I-STAT TROPONIN, ED: TROPONIN I, POC: 0.01 ng/mL (ref 0.00–0.08)

## 2016-11-06 LAB — APTT: APTT: 30 s (ref 24–36)

## 2016-11-06 LAB — ETHANOL

## 2016-11-06 LAB — PROTIME-INR
INR: 1.07
PROTHROMBIN TIME: 13.8 s (ref 11.4–15.2)

## 2016-11-06 MED ORDER — LABETALOL HCL 5 MG/ML IV SOLN
10.0000 mg | Freq: Once | INTRAVENOUS | Status: AC
  Administered 2016-11-06: 10 mg via INTRAVENOUS
  Filled 2016-11-06: qty 4

## 2016-11-06 NOTE — H&P (Signed)
TRH H&P   Patient Demographics:    Maria [name], is a 83 y.o. female  MRN: [id number]   DOB - Nov 16, 1922  Admit Date - 11/06/2016  Outpatient Primary MD for the patient is Jani Gravel, MD  Referring MD/NP/PA: Berneice Gandy  Outpatient Specialists:  Narda Amber Patient coming from: home  Chief Complaint  Patient presents with  . Headache      HPI:    Maria [name]  is a 83 y.o. female, w hypertension, cva presents with c/o left arm numbness, tingling and perioral numbness, tingling.  Started about 6:30pm.  Pt was concerned might be having stroke and called for EMS.  Pt denies weakness, headache, cp, palp, sob, n/v, diarrhea, brbpr. Black stool.    In ED, CT brain negative.  bp 196/94,  Pt will be admitted for TIA/CVA rule out vs hypertensive emergency.    Review of systems:    In addition to the HPI above,    No Fever-chills, No Headache, No changes with Vision or hearing, No problems swallowing food or Liquids, No Chest pain, Cough or Shortness of Breath, No Abdominal pain, No Nausea or Vommitting, Bowel movements are regular, No Blood in stool or Urine, No dysuria, No new skin rashes or bruises, No new joints pains-aches,   No recent weight gain or loss, No polyuria, polydypsia or polyphagia, No significant Mental Stressors.  A full 10 point Review of Systems was done, except as stated above, all other Review of Systems were negative.   With Past History of the following :    Past Medical History:  Diagnosis Date  . Allergic urticaria   . Arthritis    OA AND PAIN LEFT HIP  . Glaucoma   . Headache(784.0)    HX OF MIGRAINES  . Hypercholesterolemia   . Hypertension   . Osteoporosis   . Peripheral neuropathy   . PONV (postoperative nausea and vomiting)   . Rib fractures    IN THE PAST  . Shingles 1940'S      Past Surgical History:  Procedure  Laterality Date  . ABDOMINAL HYSTERECTOMY  1958  . APPENDECTOMY  1937  . DILATION AND CURETTAGE OF UTERUS     MULTIPLE  . EXTERNAL FIXATOR LEFT ARM FRACTURE 1997    . EYE SURGERY  1998 & 2006   BILATERAL CATARACT EXTRACTION  . JOINT REPLACEMENT  2011   RIGHT TOTAL HIP REPLACEMENT  . TONSILLECTOMY  1934  . TOTAL HIP ARTHROPLASTY  05/27/2011   Procedure: TOTAL HIP ARTHROPLASTY;  Surgeon: Gearlean Alf, MD;  Location: [facility];  Service: Orthopedics;  Laterality: Left;      Social History:     Social History  Substance Use Topics  . Smoking status: Former Research scientist (life sciences)  . Smokeless tobacco: Never Used     Comment: 68 YRS AGO  . Alcohol use No     Lives -  At home Mobility - walks by self    Family History :     Family History  Problem Relation Age of Onset  . Heart disease Mother   . Stroke Mother   . Other Father 31       MVA  . Stroke Sister   . Stroke Brother   . Atrial fibrillation Son   . Other Son 12       fire      Home Medications:   Prior to Admission medications   Medication Sig Start Date End Date Taking? Authorizing Provider  Cholecalciferol (VITAMIN D3) 2000 UNITS TABS Take 2,000 Units by mouth daily.   Yes [provider]  clopidogrel (PLAVIX) 75 MG tablet TAKE ONE TABLET BY MOUTH ONCE DAILY 09/24/16  Yes Patel, Donika K, DO  Cyanocobalamin (VITAMIN B-12 PO) Take 1 tablet by mouth daily. Gummy   Yes [provider]  lisinopril (PRINIVIL,ZESTRIL) 20 MG tablet Take 20 mg by mouth daily.  02/19/16  Yes [provider]  metoprolol succinate (TOPROL-XL) 25 MG 24 hr tablet Take 1 tablet (25 mg total) by mouth daily with breakfast. 03/14/15  Yes Ghimire, Henreitta Leber, MD  Multiple Vitamins-Minerals (PRESERVISION AREDS PO) Take 1 tablet by mouth 2 (two) times daily.   Yes [provider]  triamcinolone cream (KENALOG) 0.5 % Apply 1 application topically 2 (two) times daily as needed (rash).  02/19/16  Yes [provider]      Allergies:     Allergies  Allergen Reactions  . Codeine Nausea And Vomiting  . Tylenol [Acetaminophen]     ITCHING  . Ibuprofen Itching and Rash     Physical Exam:   Vitals  Blood pressure (!) 196/94, pulse 72, temperature 97.8 F (36.6 C), temperature source Oral, resp. rate 17, SpO2 99 %.   1. General  lying in bed in NAD,    2. Normal affect and insight, Not Suicidal or Homicidal, Awake Alert, Oriented X 3.  3. No F.N deficits, ALL C.Nerves Intact, Strength 5/5 all 4 extremities, Sensation intact all 4 extremities, Plantars down going.  4. Ears and Eyes appear Normal, Conjunctivae clear, PERRLA. Moist Oral Mucosa.  5. Supple Neck, No JVD, No cervical lymphadenopathy appriciated, No Carotid Bruits.  6. Symmetrical Chest wall movement, Good air movement bilaterally, CTAB.  7. RRR, No Gallops, Rubs or Murmurs, No Parasternal Heave.  8. Positive Bowel Sounds, Abdomen Soft, No tenderness, No organomegaly appriciated,No rebound -guarding or rigidity.  9.  No Cyanosis, Normal Skin Turgor, No Skin Rash or Bruise.  10. Good muscle tone,  joints appear normal , no effusions, Normal ROM.  11. No Palpable Lymph Nodes in Neck or Axillae     Data Review:    CBC  Recent Labs Lab 11/06/16 2115 11/06/16 2126  WBC 3.5*  --   HGB 11.7* 12.6  HCT 36.7 37.0  PLT 187  --   MCV 92.7  --   MCH 29.5  --   MCHC 31.9  --   RDW 14.1  --   LYMPHSABS 1.0  --   MONOABS 0.4  --   EOSABS 0.2  --   BASOSABS 0.0  --    ------------------------------------------------------------------------------------------------------------------  Chemistries   Recent Labs Lab 11/06/16 2115 11/06/16 2126  NA 137 140  K 4.4 4.3  CL 104 103  CO2 28  --   GLUCOSE 115* 113*  BUN 19 24*  CREATININE 0.90 0.90  CALCIUM 8.6*  --   AST  29  --   ALT 13*  --   ALKPHOS 79  --   BILITOT 0.5  --     ------------------------------------------------------------------------------------------------------------------ CrCl cannot be calculated (Unknown ideal weight.). ------------------------------------------------------------------------------------------------------------------ No results for input(s): TSH, T4TOTAL, T3FREE, THYROIDAB in the last 72 hours.  Invalid input(s): FREET3  Coagulation profile  Recent Labs Lab 11/06/16 2115  INR 1.07   ------------------------------------------------------------------------------------------------------------------- No results for input(s): DDIMER in the last 72 hours. -------------------------------------------------------------------------------------------------------------------  Cardiac Enzymes No results for input(s): CKMB, TROPONINI, MYOGLOBIN in the last 168 hours.  Invalid input(s): CK ------------------------------------------------------------------------------------------------------------------ No results found for: BNP   ---------------------------------------------------------------------------------------------------------------  Urinalysis    Component Value Date/Time   COLORURINE STRAW (A) 11/06/2016 2200   APPEARANCEUR CLEAR 11/06/2016 2200   LABSPEC 1.006 11/06/2016 2200   PHURINE 7.0 11/06/2016 2200   GLUCOSEU NEGATIVE 11/06/2016 2200   HGBUR NEGATIVE 11/06/2016 2200   BILIRUBINUR NEGATIVE 11/06/2016 2200   KETONESUR NEGATIVE 11/06/2016 2200   PROTEINUR NEGATIVE 11/06/2016 2200   UROBILINOGEN 0.2 05/14/2011 1554   NITRITE NEGATIVE 11/06/2016 2200   LEUKOCYTESUR NEGATIVE 11/06/2016 2200    ----------------------------------------------------------------------------------------------------------------   Imaging Results:    Dg Chest 2 View  Result Date: 11/06/2016 CLINICAL DATA:  Shortness of breath EXAM: CHEST  2 VIEW COMPARISON:  05/14/2011 FINDINGS: Low lung volumes. Mild diffuse coarse  interstitial opacity, increased compared to prior could relate to chronic change although superimposed acute edema cannot be excluded. Borderline to mild cardiomegaly with linear atelectasis at the left base. No pleural effusion. Enlarged pulmonary hila bilaterally. IMPRESSION: 1. Very low lung volumes 2. Borderline cardiomegaly. Mild diffuse interstitial opacity may reflect combination of chronic change and superimposed mild edema 3. Enlarged pulmonary hila bilaterally, possibly due to enlarged pulmonary vessels as may be seen with pulmonary hypertension, although adenopathy is also a consideration. Short interval radiographic follow-up recommended; alternatively CT could be obtained to further assess Electronically Signed   By: Donavan Foil M.D.   On: 11/06/2016 22:05   Ct Head Wo Contrast  Result Date: 11/06/2016 CLINICAL DATA:  Left-sided facial and hand numbness. EXAM: CT HEAD WITHOUT CONTRAST TECHNIQUE: Contiguous axial images were obtained from the base of the skull through the vertex without intravenous contrast. COMPARISON:  Head CT 10/01/2015 FINDINGS: Brain: No mass lesion, intraparenchymal hemorrhage or extra-axial collection. No evidence of acute cortical infarct. Brain parenchyma and CSF-containing spaces are normal for age. 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 Vascular: No hyperdense vessel or unexpected calcification. Skull: Normal visualized skull base, calvarium and extracranial soft tissues. Sinuses/Orbits: No sinus fluid levels or advanced mucosal thickening. No mastoid effusion. Normal orbits. IMPRESSION: No acute intracranial abnormality. Mild atrophy, unchanged. Small, old left cerebellar infarct. Electronically Signed   By: Ulyses Jarred M.D.   On: 11/06/2016 22:00   NSR at 82, lad, t inversion in v1, 2   Assessment & Plan:    Active Problems:   TIA (transient ischemic attack)   L arm numbness, tingling TIA/CVA vs hypertensive emergency Tele Trop I q6h x3 Check hga1c,  lipid Check esr, ana, b12, tsh Check MRI /MRA brain Carotid ultrasound Cardiac echo Aspirin 34m po qday  Plavix 750mpo qday Start Lipitor 2013mo qhs  Hypertension uncontrolled Increase lisinopril to 72m39m qhs Cont metoprolol  Hyperglycemia Check hga1c as above  Anemia Check cfbc in am   DVT Prophylaxis  - SCDs   AM Labs Ordered, also please review Full Orders  Family Communication: Admission, patients condition and plan of care including tests being ordered have been  discussed with the patient  who indicate understanding and agree with the plan and Code Status.  Code Status FULL CODE  Likely DC to  home  Condition GUARDED    Consults called:   Admission status: inpatient  Time spent in minutes : 45 minutes   Jani Gravel M.D on 11/06/2016 at 11:46 PM  Between 7am to 7pm - Pager - [phone]   After 7pm go to [email or web address] - password [facility]  [facility] - Office  [phone]

## 2016-11-06 NOTE — ED Triage Notes (Signed)
Pt transported from home by EMS with c/o HA, "feeling funny", pt reports compliance with HTN meds last BP 200/100. Pt A & O, no deficits per EMS. IV est by EMS.

## 2016-11-06 NOTE — ED Notes (Signed)
Patient transported to CT 

## 2016-11-06 NOTE — ED Notes (Signed)
MD at bedside. 

## 2016-11-06 NOTE — ED Provider Notes (Signed)
[facility] DEPT Provider Note   CSN: [id number] Arrival date & time: 11/06/16  2048  History   Chief Complaint Chief Complaint  Patient presents with  . Headache    HPI Maria [name] is a 83 y.o. female.  Patient with PMH of HLD, HTN, and TIA who presents for evaluation of elevated blood pressure and L sided facial numbness and L arm numbness. The patient states she was in usual state of health this evening until she sat down to watch TV. She then became "woozy" and thought she should take her blood pressure, as previous doctors told her to check her blood pressure if she started to feel weird. The patient checked her BP and noticed that it was elevated above 200/100. At the same time she noticed elevated BP, the patient described a headache and numbness on the left side of her face around the corner of her left mouth and nostril. The patient then felt tingling/numbness in the fingers of her left hand that moved up her arm. She denied changes in vision, dizziness, and focal weakness of upper and lower extremities. She got nervous about her symptoms so called EMS. She states that her numbness/tingling in her L face/upper extremity resolved around the time EMS came to take her to hospital.   The patient states she takes her blood pressure at home everyday and has no difficulties taking her BP medications each day. She has seen elevated BP readings in the past, stating that SBP can get as high as 160 or 170 but that it always goes back under 140 when she retakes it. She took all medications as prescribed today.     Past Medical History:  Diagnosis Date  . Allergic urticaria   . Arthritis    OA AND PAIN LEFT HIP  . Glaucoma   . Headache(784.0)    HX OF MIGRAINES  . Hypercholesterolemia   . Hypertension   . Osteoporosis   . Peripheral neuropathy   . PONV (postoperative nausea and vomiting)   . Rib fractures    IN THE PAST  . Shingles 1940'S   Patient Active Problem List     Diagnosis Date Noted  . TIA (transient ischemic attack) 03/12/2015  . Dependent edema 03/12/2015  . Dysphagia   . Hyperlipidemia 11/16/2014  . Essential hypertension 11/16/2014  . Postop Acute blood loss anemia 06/01/2011  . Osteoarthritis of hip 05/27/2011   Past Surgical History:  Procedure Laterality Date  . ABDOMINAL HYSTERECTOMY  1958  . APPENDECTOMY  1937  . DILATION AND CURETTAGE OF UTERUS     MULTIPLE  . EXTERNAL FIXATOR LEFT ARM FRACTURE 1997    . EYE SURGERY  1998 & 2006   BILATERAL CATARACT EXTRACTION  . JOINT REPLACEMENT  2011   RIGHT TOTAL HIP REPLACEMENT  . TONSILLECTOMY  1934  . TOTAL HIP ARTHROPLASTY  05/27/2011   Procedure: TOTAL HIP ARTHROPLASTY;  Surgeon: Gearlean Alf, MD;  Location: [facility];  Service: Orthopedics;  Laterality: Left;   OB History    No data available      Home Medications    Prior to Admission medications   Medication Sig Start Date End Date Taking? Authorizing Provider  Cholecalciferol (VITAMIN D3) 2000 UNITS TABS Take 2,000 Units by mouth daily.    [provider]  clopidogrel (PLAVIX) 75 MG tablet TAKE ONE TABLET BY MOUTH ONCE DAILY 09/24/16   Narda Amber K, DO  Cyanocobalamin (VITAMIN B-12 PO) Take 1 tablet by mouth daily.  [provider]  lisinopril (PRINIVIL,ZESTRIL) 20 MG tablet  02/19/16   [provider]  metoprolol succinate (TOPROL-XL) 25 MG 24 hr tablet Take 1 tablet (25 mg total) by mouth daily with breakfast. 03/14/15   Ghimire, Henreitta Leber, MD  Multiple Vitamins-Minerals (PRESERVISION AREDS PO) Take 1 tablet by mouth 2 (two) times daily.    [provider]  triamcinolone cream (KENALOG) 0.5 %  02/19/16   [provider]    Family History Family History  Problem Relation Age of Onset  . Heart disease Mother   . Stroke Mother   . Other Father 43       MVA  . Stroke Sister   . Stroke Brother   . Atrial fibrillation Son   . Other Son 78       fire   Social  History Social History  Substance Use Topics  . Smoking status: Former Research scientist (life sciences)  . Smokeless tobacco: Never Used     Comment: 68 YRS AGO  . Alcohol use No    Allergies   Codeine; Tylenol [acetaminophen]; and Ibuprofen  Review of Systems Review of Systems  Constitutional: Negative for chills, diaphoresis and fever.  HENT: Negative for congestion, rhinorrhea, sinus pain and sore throat.   Eyes: Negative for photophobia and visual disturbance.  Respiratory: Negative for cough, shortness of breath and wheezing.   Cardiovascular: Positive for leg swelling (chronic, at baseline). Negative for chest pain.  Gastrointestinal: Negative for abdominal pain, nausea and vomiting.  Genitourinary: Negative for dysuria, flank pain, frequency and urgency.  Neurological: Positive for light-headedness, numbness (LUE) and headaches. Negative for speech difficulty and weakness.   Physical Exam Updated Vital Signs BP (!) 196/94   Pulse 72   Temp 97.8 F (36.6 C) (Oral)   Resp 17   SpO2 99%   Physical Exam  Constitutional: She is oriented to person, place, and time. She appears well-developed and well-nourished. No distress.  HENT:  Head: Normocephalic and atraumatic.  Mouth/Throat: Oropharynx is clear and moist. No oropharyngeal exudate.  Eyes: Pupils are equal, round, and reactive to light. Conjunctivae and EOM are normal.  Cardiovascular: Normal rate and regular rhythm.  Exam reveals no friction rub.   No murmur heard. Pulmonary/Chest: Effort normal. No respiratory distress. She has no wheezes. She has no rales.  Abdominal: Soft. She exhibits no distension and no mass. There is no tenderness. There is no guarding.  Musculoskeletal: She exhibits edema (Nonpitting edema of bilateral lower extremities). She exhibits no tenderness (of bilateral lower extremities).  Lymphadenopathy:    She has no cervical adenopathy.  Neurological: She is alert and oriented to person, place, and time.  Face  strength and sensation intact bilaterally. Tongue midline. 5/5 bicep, tricep, and grip strength bilaterally. 5/5 hip flexion, dorsiflexion, and plantarflexion bilaterally. Gross sensation to light touch of upper and lower extremities intact bilaterally. RAM intact bilaterally. Heel to shin testing within normal limits bilaterally.   Skin: Skin is warm and dry. Capillary refill takes less than 2 seconds. No rash noted. No erythema. No pallor.  Psychiatric: Her behavior is normal. Judgment and thought content normal.   ED Treatments / Results  Labs (all labs ordered are listed, but only abnormal results are displayed) Labs Reviewed  CBC - Abnormal; Notable for the following:       Result Value   WBC 3.5 (*)    Hemoglobin 11.7 (*)    All other components within normal limits  COMPREHENSIVE METABOLIC PANEL - Abnormal; Notable for  the following:    Glucose, Bld 115 (*)    Calcium 8.6 (*)    Total Protein 6.1 (*)    ALT 13 (*)    GFR calc non Af Amer 54 (*)    All other components within normal limits  URINALYSIS, ROUTINE W REFLEX MICROSCOPIC - Abnormal; Notable for the following:    Color, Urine STRAW (*)    All other components within normal limits  I-STAT CHEM 8, ED - Abnormal; Notable for the following:    BUN 24 (*)    Glucose, Bld 113 (*)    Calcium, Ion 1.04 (*)    All other components within normal limits  ETHANOL  PROTIME-INR  APTT  DIFFERENTIAL  RAPID URINE DRUG SCREEN, HOSP PERFORMED  I-STAT TROPONIN, ED    EKG  EKG Interpretation  Date/Time:  Friday November 06 2016 21:03:13 EDT Ventricular Rate:  82 PR Interval:    QRS Duration: 96 QT Interval:  382 QTC Calculation: 447 R Axis:   -25 Text Interpretation:  Sinus rhythm Borderline prolonged PR interval Borderline left axis deviation Minimal ST elevation, inferior leads No significant change since last tracing Confirmed by Deno Etienne [phone]) on 11/06/2016 9:16:04 PM      Radiology Dg Chest 2 View  Result Date:  11/06/2016 CLINICAL DATA:  Shortness of breath EXAM: CHEST  2 VIEW COMPARISON:  05/14/2011 FINDINGS: Low lung volumes. Mild diffuse coarse interstitial opacity, increased compared to prior could relate to chronic change although superimposed acute edema cannot be excluded. Borderline to mild cardiomegaly with linear atelectasis at the left base. No pleural effusion. Enlarged pulmonary hila bilaterally. IMPRESSION: 1. Very low lung volumes 2. Borderline cardiomegaly. Mild diffuse interstitial opacity may reflect combination of chronic change and superimposed mild edema 3. Enlarged pulmonary hila bilaterally, possibly due to enlarged pulmonary vessels as may be seen with pulmonary hypertension, although adenopathy is also a consideration. Short interval radiographic follow-up recommended; alternatively CT could be obtained to further assess Electronically Signed   By: Donavan Foil M.D.   On: 11/06/2016 22:05   Ct Head Wo Contrast  Result Date: 11/06/2016 CLINICAL DATA:  Left-sided facial and hand numbness. EXAM: CT HEAD WITHOUT CONTRAST TECHNIQUE: Contiguous axial images were obtained from the base of the skull through the vertex without intravenous contrast. COMPARISON:  Head CT 10/01/2015 FINDINGS: Brain: No mass lesion, intraparenchymal hemorrhage or extra-axial collection. No evidence of acute cortical infarct. Brain parenchyma and CSF-containing spaces are normal for age. 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 Vascular: No hyperdense vessel or unexpected calcification. Skull: Normal visualized skull base, calvarium and extracranial soft tissues. Sinuses/Orbits: No sinus fluid levels or advanced mucosal thickening. No mastoid effusion. Normal orbits. IMPRESSION: No acute intracranial abnormality. Mild atrophy, unchanged. Small, old left cerebellar infarct. Electronically Signed   By: Ulyses Jarred M.D.   On: 11/06/2016 22:00    Procedures Procedures (including critical care time)  Medications Ordered in  ED Medications  labetalol (NORMODYNE,TRANDATE) injection 10 mg (10 mg Intravenous Given 11/06/16 2259)    Initial Impression / Assessment and Plan / ED Course  I have reviewed the triage vital signs and the nursing notes.  Pertinent labs & imaging results that were available during my care of the patient were reviewed by me and considered in my medical decision making (see chart for details).  Patient with PMH of HLD, HTN, and TIA who presents for acute onset of high blood pressure associated with headache, L facial numbness around nostril and corner of mouth, and  LUE numbness. Patient has history of TIA/complex migraine per chart review and is followed by [facility] for these events which occurred in 2017.   Patient continues to be hypertensive in ED with multiple readings of SBP >190. Patient does not currently endorse headache or parasthesias, but states that she still feels a little "funny". Patient's Head CT negative for acute intracranial process. Her lab work thus far does not reveal signs of end organ damage, however her previous symptoms of headache and parasethias are concerning for TIA or hypertensive urgency. Given patient's persistent elevated BP, I favor more hypertensive urgency and plan to give labetalol. Will also admit for observation and BP management.   Patient's chest CT revealed possible enlargement of pulmonary vessels 2/2 pulmonary hypertension and/or bilateral adenopathy. They recommend short-interval radiographic follow up. Will discuss with admitting team to see if they would be amenable to CT chest while inpatient.    Final Clinical Impressions(s) / ED Diagnoses   Final diagnoses:  Hypertensive urgency   Plan to admit for observation and management of hypertensive urgency.   New Prescriptions New Prescriptions   No medications on file     Thomasene Ripple, MD 11/06/16 [place], [place], DO 11/06/16 2321

## 2016-11-07 ENCOUNTER — Inpatient Hospital Stay (HOSPITAL_BASED_OUTPATIENT_CLINIC_OR_DEPARTMENT_OTHER): Payer: Medicare Other

## 2016-11-07 ENCOUNTER — Inpatient Hospital Stay (HOSPITAL_COMMUNITY): Payer: Medicare Other

## 2016-11-07 DIAGNOSIS — G458 Other transient cerebral ischemic attacks and related syndromes: Secondary | ICD-10-CM

## 2016-11-07 DIAGNOSIS — I10 Essential (primary) hypertension: Secondary | ICD-10-CM

## 2016-11-07 DIAGNOSIS — E785 Hyperlipidemia, unspecified: Secondary | ICD-10-CM

## 2016-11-07 DIAGNOSIS — G459 Transient cerebral ischemic attack, unspecified: Principal | ICD-10-CM

## 2016-11-07 DIAGNOSIS — I160 Hypertensive urgency: Secondary | ICD-10-CM

## 2016-11-07 LAB — COMPREHENSIVE METABOLIC PANEL
ALBUMIN: 3.6 g/dL (ref 3.5–5.0)
ALK PHOS: 82 U/L (ref 38–126)
ALT: 15 U/L (ref 14–54)
ANION GAP: 7 (ref 5–15)
AST: 21 U/L (ref 15–41)
BILIRUBIN TOTAL: 0.6 mg/dL (ref 0.3–1.2)
BUN: 14 mg/dL (ref 6–20)
CALCIUM: 8.7 mg/dL — AB (ref 8.9–10.3)
CO2: 28 mmol/L (ref 22–32)
Chloride: 105 mmol/L (ref 101–111)
Creatinine, Ser: 0.8 mg/dL (ref 0.44–1.00)
GFR calc Af Amer: >60 mL/min (ref >60)
GFR calc non Af Amer: >60 mL/min (ref >60)
GLUCOSE: 100 mg/dL — AB (ref 65–99)
Potassium: 3.9 mmol/L (ref 3.5–5.1)
SODIUM: 140 mmol/L (ref 135–145)
TOTAL PROTEIN: 5.8 g/dL — AB (ref 6.5–8.1)

## 2016-11-07 LAB — CBC
HEMATOCRIT: 38.6 % (ref 36.0–46.0)
HEMOGLOBIN: 12.2 g/dL (ref 12.0–15.0)
MCH: 29.3 pg (ref 26.0–34.0)
MCHC: 31.6 g/dL (ref 30.0–36.0)
MCV: 92.8 fL (ref 78.0–100.0)
Platelets: 182 10*3/uL (ref 150–400)
RBC: 4.16 MIL/uL (ref 3.87–5.11)
RDW: 14 % (ref 11.5–15.5)
WBC: 3.8 10*3/uL — ABNORMAL LOW (ref 4.0–10.5)

## 2016-11-07 LAB — LIPID PANEL
CHOL/HDL RATIO: 3.5 ratio
CHOLESTEROL: 178 mg/dL (ref 0–200)
HDL: 51 mg/dL (ref >40)
LDL Cholesterol: 112 mg/dL — ABNORMAL HIGH (ref 0–99)
Triglycerides: 77 mg/dL (ref <150)
VLDL: 15 mg/dL (ref 0–40)

## 2016-11-07 LAB — TSH: TSH: 4.873 u[IU]/mL — AB (ref 0.350–4.500)

## 2016-11-07 LAB — HEMOGLOBIN A1C
Hgb A1c MFr Bld: 5.8 % — ABNORMAL HIGH (ref 4.8–5.6)
MEAN PLASMA GLUCOSE: 119.76 mg/dL

## 2016-11-07 LAB — VITAMIN B12: Vitamin B-12: 123 pg/mL — ABNORMAL LOW (ref 180–914)

## 2016-11-07 LAB — SEDIMENTATION RATE: Sed Rate: 6 mm/hr (ref 0–22)

## 2016-11-07 MED ORDER — CLOPIDOGREL BISULFATE 75 MG PO TABS
75.0000 mg | ORAL_TABLET | Freq: Every day | ORAL | Status: DC
  Administered 2016-11-07 – 2016-11-08 (×2): 75 mg via ORAL
  Filled 2016-11-07 (×2): qty 1

## 2016-11-07 MED ORDER — ATORVASTATIN CALCIUM 10 MG PO TABS
20.0000 mg | ORAL_TABLET | Freq: Every day | ORAL | Status: DC
Start: 2016-11-07 — End: 2016-11-08
  Administered 2016-11-07: 20 mg via ORAL
  Filled 2016-11-07: qty 2

## 2016-11-07 MED ORDER — ASPIRIN EC 81 MG PO TBEC
81.0000 mg | DELAYED_RELEASE_TABLET | Freq: Every day | ORAL | Status: DC
  Filled 2016-11-07: qty 1

## 2016-11-07 MED ORDER — STROKE: EARLY STAGES OF RECOVERY BOOK
Freq: Once | Status: AC
  Administered 2016-11-07: 07:00:00

## 2016-11-07 MED ORDER — LISINOPRIL 40 MG PO TABS: 40.0000 mg | ORAL_TABLET | Freq: Every day | ORAL | 4 refills | Status: DC

## 2016-11-07 MED ORDER — ASPIRIN 81 MG PO TBEC: 81.0000 mg | DELAYED_RELEASE_TABLET | Freq: Every day | ORAL | 4 refills | Status: DC

## 2016-11-07 MED ORDER — LISINOPRIL 20 MG PO TABS
40.0000 mg | ORAL_TABLET | Freq: Every day | ORAL | Status: DC
  Administered 2016-11-07: 40 mg via ORAL
  Filled 2016-11-07: qty 2

## 2016-11-07 MED ORDER — AMLODIPINE BESYLATE 2.5 MG PO TABS: 2.5000 mg | ORAL_TABLET | Freq: Every day | ORAL | 3 refills | Status: DC

## 2016-11-07 MED ORDER — ASPIRIN EC 325 MG PO TBEC: 325.0000 mg | DELAYED_RELEASE_TABLET | Freq: Every day | ORAL | Status: DC

## 2016-11-07 MED ORDER — HYDRALAZINE HCL 20 MG/ML IJ SOLN: 5.0000 mg | Freq: Four times a day (QID) | INTRAMUSCULAR | Status: DC | PRN

## 2016-11-07 MED ORDER — AMLODIPINE BESYLATE 2.5 MG PO TABS
2.5000 mg | ORAL_TABLET | Freq: Every day | ORAL | Status: DC
  Administered 2016-11-07 – 2016-11-08 (×2): 2.5 mg via ORAL
  Filled 2016-11-07 (×2): qty 1

## 2016-11-07 MED ORDER — METOPROLOL SUCCINATE ER 25 MG PO TB24
25.0000 mg | ORAL_TABLET | Freq: Every day | ORAL | Status: DC
  Administered 2016-11-07 – 2016-11-08 (×2): 25 mg via ORAL
  Filled 2016-11-07 (×2): qty 1

## 2016-11-07 MED ORDER — VITAMIN D 1000 UNITS PO TABS
2000.0000 [IU] | ORAL_TABLET | Freq: Every day | ORAL | Status: DC
Start: 2016-11-07 — End: 2016-11-08
  Administered 2016-11-07 – 2016-11-08 (×2): 2000 [IU] via ORAL
  Filled 2016-11-07 (×2): qty 2

## 2016-11-07 MED ORDER — ATORVASTATIN CALCIUM 20 MG PO TABS: 20.0000 mg | ORAL_TABLET | Freq: Every day | ORAL | 4 refills | Status: DC

## 2016-11-07 NOTE — Evaluation (Signed)
Occupational Therapy Evaluation Patient Details Name: Maria [name] MRN: [id number] DOB: Jul 02, 1922 Today's Date: 11/07/2016    History of Present Illness Pt is a 83 y/o female admitted secondary to L face and L UE parasthesia. MRI negative for any acute findings. PMH including but not limited to HTN and glaucoma.   Clinical Impression   PTA, pt was living with her husband and was independent. Pt currently performing ADLs and functional mobility at supervision level. Pt reports she feels near baseline and is able to verbalize stroke signs and symptoms. Recommend dc home once medically stable per physician. All acute OT needs met and pt questions answered. Will sign off. Please re-order if status changes. Thank you.    Follow Up Recommendations  No OT follow up;Supervision/Assistance - 24 hour    Equipment Recommendations  None recommended by OT    Recommendations for Other Services       Precautions / Restrictions Precautions Precautions: Fall Restrictions Weight Bearing Restrictions: No      Mobility Bed Mobility Overal bed mobility: Modified Independent                Transfers Overall transfer level: Needs assistance Equipment used: None Transfers: Sit to/from Stand Sit to Stand: Supervision         General transfer comment: supervision for safety    Balance Overall balance assessment: Needs assistance Sitting-balance support: Feet supported Sitting balance-Leahy Scale: Good Sitting balance - Comments: pt able to sit EOB with supervision   Standing balance support: During functional activity;No upper extremity supported Standing balance-Leahy Scale: Fair                             ADL either performed or assessed with clinical judgement   ADL Overall ADL's : Needs assistance/impaired                                       General ADL Comments: Pt is performing all her ADLs and functional mobility at supervision  level. Pt demonstrating LB dressing, tub transfer, and grooming at sink.     Vision Baseline Vision/History: Wears glasses Wears Glasses: Reading only;Distance only ("reading and driving") Patient Visual Report: No change from baseline       Perception     Praxis      Pertinent Vitals/Pain Pain Assessment: No/denies pain     Hand Dominance     Extremity/Trunk Assessment Upper Extremity Assessment Upper Extremity Assessment: Overall WFL for tasks assessed   Lower Extremity Assessment Lower Extremity Assessment: Defer to PT evaluation       Communication Communication Communication: No difficulties   Cognition Arousal/Alertness: Awake/alert Behavior During Therapy: WFL for tasks assessed/performed Overall Cognitive Status: Within Functional Limits for tasks assessed                                 General Comments: Pt reporting that she is becoming forgetful. Discussed writting important information down such as making lists for groceries, taking a notebook to her doctor's apts, and using her calendar.   General Comments       Exercises     Shoulder Instructions      Home Living Family/patient expects to be discharged to:: Private residence Living Arrangements: Spouse/significant other Available Help at Discharge: Family;Available 24 hours/day Type  of Home: House Home Access: Stairs to enter [employer] of Steps: 4 Entrance Stairs-Rails: Right;Left Home Layout: One level     Bathroom Shower/Tub: Tub/shower unit         Home Equipment: Cane - single point;Grab bars - tub/shower          Prior Functioning/Environment Level of Independence: Independent with assistive device(s)        Comments: ADLs, IADLs, and driving. pt reports that she and her husband go to the [facility] every day to walk        OT Problem List: Decreased strength;Decreased activity tolerance;Impaired balance (sitting and/or standing);Decreased knowledge of  use of DME or AE;Decreased knowledge of precautions      OT Treatment/Interventions:      OT Goals(Current goals can be found in the care plan section) Acute Rehab OT Goals Patient Stated Goal: return home and to PLOF OT Goal Formulation: With patient Time For Goal Achievement: 11/21/16 Potential to Achieve Goals: Good ADL Goals Pt Will Perform Grooming: with min guard assist;standing Pt Will Perform Upper Body Dressing: with set-up;with supervision;sitting Pt Will Perform Lower Body Dressing: with min guard assist;sit to/from stand Pt Will Transfer to Toilet: with min guard assist;bedside commode;ambulating  OT Frequency:     Barriers to D/C:            Co-evaluation              AM-PAC PT "6 Clicks" Daily Activity     Outcome Measure Help from another person eating meals?: None Help from another person taking care of personal grooming?: A Little Help from another person toileting, which includes using toliet, bedpan, or urinal?: A Little Help from another person bathing (including washing, rinsing, drying)?: A Little Help from another person to put on and taking off regular upper body clothing?: None Help from another person to put on and taking off regular lower body clothing?: A Little 6 Click Score: 20   End of Session Equipment Utilized During Treatment: Other (comment) [facility]) Nurse Communication: Mobility status  Activity Tolerance: Patient tolerated treatment well Patient left: in bed;with call bell/phone within reach  OT Visit Diagnosis: Unsteadiness on feet (R26.81);Muscle weakness (generalized) (M62.81)                Time: 1350-1411 OT Time Calculation (min): 21 min Charges:  OT General Charges $OT Visit: 1 Visit OT Evaluation $OT Eval Moderate Complexity: 1 Mod G-Codes:     [place] MSOT, OTR/L Acute Rehab Pager: [phone] Office: [place] 11/07/2016, 4:38 PM

## 2016-11-07 NOTE — Progress Notes (Signed)
PT Cancellation Note  Patient Details Name: Maria [name] MRN: [id number] DOB: [birth year]   Cancelled Treatment:    Reason Eval/Treat Not Completed: Patient at procedure or test/unavailable. PT will continue to f/u with pt as available.    [place] 11/07/2016, 11:53 AM

## 2016-11-07 NOTE — Progress Notes (Signed)
OT Cancellation    11/07/16 1100  OT Visit Information  Last OT Received On 11/07/16  Reason Eval/Treat Not Completed Patient at procedure or test/ unavailable (Will return as schedule allows.)   [place], OTR/L Acute Rehab Pager: [phone] Office: [phone]

## 2016-11-07 NOTE — Progress Notes (Signed)
VASCULAR LAB PRELIMINARY  PRELIMINARY  PRELIMINARY  PRELIMINARY  Carotid duplex completed.    Preliminary report:  Bilateral:  1-39% ICA stenosis.  Vertebral artery flow is antegrade.     Evoleht Hovatter, [place], RVS 11/07/2016, 12:32 PM

## 2016-11-07 NOTE — Evaluation (Signed)
Physical Therapy Evaluation Patient Details Name: Maria [name] MRN: [id number] DOB: Sep 06, 1922 Today's Date: 11/07/2016   History of Present Illness  Pt is a 83 y/o female admitted secondary to L face and L UE parasthesia. MRI negative for any acute findings. PMH including but not limited to HTN and glaucoma.  Clinical Impression  Pt presented supine in bed with HOB elevated, awake and willing to participate in therapy session. Prior to admission, pt reported that she ambulates with use of SPC when outside of her home and is independent with ADLs (stands to shower). Pt ambulated in hallway with use of SPC and min guard for safety. Pt also successfully completed stair training with use of hand rails and min guard. PT will continue to follow pt acutely to ensure a safe d/c home.    Follow Up Recommendations No PT follow up    Equipment Recommendations  None recommended by PT    Recommendations for Other Services       Precautions / Restrictions Precautions Precautions: Fall Restrictions Weight Bearing Restrictions: No      Mobility  Bed Mobility Overal bed mobility: Modified Independent                Transfers Overall transfer level: Needs assistance Equipment used: None Transfers: Sit to/from Stand Sit to Stand: Supervision         General transfer comment: supervision for safety  Ambulation/Gait Ambulation/Gait assistance: Min guard Ambulation Distance (Feet): 200 Feet Assistive device: Straight cane Gait Pattern/deviations: Step-through pattern;Decreased step length - right;Decreased step length - left;Decreased stride length;Drifts right/left;Trunk flexed Gait velocity: decreased Gait velocity interpretation: <1.8 ft/sec, indicative of risk for recurrent falls General Gait Details: mild instability but no overt LOB or need for physical assistance, close min guard for safety  Stairs Stairs: Yes Stairs assistance: Min guard Stair Management: Two  rails;Alternating pattern;Forwards Number of Stairs: 2 General stair comments: no instability or LOB  Wheelchair Mobility    Modified Rankin (Stroke Patients Only)       Balance Overall balance assessment: Needs assistance Sitting-balance support: Feet supported Sitting balance-Leahy Scale: Good Sitting balance - Comments: pt able to sit EOB with supervision   Standing balance support: During functional activity;No upper extremity supported Standing balance-Leahy Scale: Fair                               Pertinent Vitals/Pain Pain Assessment: No/denies pain    Home Living Family/patient expects to be discharged to:: Private residence Living Arrangements: Spouse/significant other Available Help at Discharge: Family;Available 24 hours/day Type of Home: House Home Access: Stairs to enter Entrance Stairs-Rails: Psychiatric nurse of Steps: 4 Home Layout: One level Home Equipment: Cane - single point      Prior Function Level of Independence: Independent with assistive device(s)         Comments: ambulates long distances with SPC; pt reports that she and her husband go to the [facility] every day to walk     Hand Dominance        Extremity/Trunk Assessment   Upper Extremity Assessment Upper Extremity Assessment: Defer to OT evaluation    Lower Extremity Assessment Lower Extremity Assessment: Generalized weakness       Communication   Communication: No difficulties  Cognition Arousal/Alertness: Awake/alert Behavior During Therapy: WFL for tasks assessed/performed Overall Cognitive Status: Within Functional Limits for tasks assessed  General Comments      Exercises     Assessment/Plan    PT Assessment Patient needs continued PT services  PT Problem List Decreased balance;Decreased coordination;Decreased mobility;Decreased safety awareness       PT Treatment  Interventions DME instruction;Gait training;Stair training;Functional mobility training;Therapeutic activities;Neuromuscular re-education;Balance training;Therapeutic exercise;Patient/family education    PT Goals (Current goals can be found in the Care Plan section)  Acute Rehab PT Goals Patient Stated Goal: return home and to PLOF PT Goal Formulation: With patient Time For Goal Achievement: 11/21/16 Potential to Achieve Goals: Good    Frequency Min 4X/week   Barriers to discharge        Co-evaluation               AM-PAC PT "6 Clicks" Daily Activity  Outcome Measure Difficulty turning over in bed (including adjusting bedclothes, sheets and blankets)?: None Difficulty moving from lying on back to sitting on the side of the bed? : A Little Difficulty sitting down on and standing up from a chair with arms (e.g., wheelchair, bedside commode, etc,.)?: A Little Help needed moving to and from a bed to chair (including a wheelchair)?: None Help needed walking in hospital room?: A Little Help needed climbing 3-5 steps with a railing? : A Little 6 Click Score: 20    End of Session Equipment Utilized During Treatment: Gait belt Activity Tolerance: Patient tolerated treatment well Patient left: in bed;with call bell/phone within reach;Other (comment) (OT entering room) Nurse Communication: Mobility status PT Visit Diagnosis: Unsteadiness on feet (R26.81);Other abnormalities of gait and mobility (R26.89)    Time: 8003-4917 PT Time Calculation (min) (ACUTE ONLY): 24 min   Charges:   PT Evaluation $PT Eval Moderate Complexity: 1 Mod PT Treatments $Gait Training: 8-22 mins   PT G Codes:        [place], PT, [place] 11/07/2016, 3:17 PM

## 2016-11-07 NOTE — Progress Notes (Signed)
[facility]                                                                              Patient Demographics  Maria [name], is a 83 y.o. female, DOB - [birth year], GXQ:[id number]  Admit date - 11/06/2016   Admitting Physician Jani Gravel, MD  Outpatient Primary MD for the patient is Jani Gravel, MD  Outpatient specialists:   LOS - 0  days   Medical records reviewed and are as summarized below:    Chief Complaint  Patient presents with  . Headache       Brief summary   Maria [name]  is a 83 y.o. female, with hypertension,hyperlipidemia, neuropathy, migraines, prior TIA presents with c/o left arm numbness, tingling and perioral numbness, tingling.  Started about 6:30pm.  NED CT brain was negative, BP 196/94. Patient was admitted for possible CVA/TIA   Assessment & Plan    Principal Problem:   TIA (transient ischemic attack) presented with left arm numbness, tingling, perioral numbness and tingling resolved within a 1 minute - MRI of the brain showed no acute intracranial abnormality, chronic microhemorrhages hrough out the cerebral, greater than cerebellar hemispheres, likely increased from 2017 may be secondary to chronic hypertension - MRA showed no large vessel occlusion, new moderate to severe left P2 stenosis, unchanged severe right P2 stenosis, unchanged mild left ICA and right ACA stenosis - LDL 112, Placed on Lipitor - Carotid Dopplers showed 1-39% ICA stenosis - neurology consulted, placed on aspirin 81 mg daily with Plavix - BP control, PT eval    Active Problems:   Hyperlipidemia - LDL 112, Placed on Lipitor    Essential hypertension/ hypertensive urgency - BP uncontrolled, added amlodipine 2.5 mg daily, increased lisinopril to 40 mg daily, metoprolol 25 mg daily  Code Status: full code  DVT Prophylaxis:  SCD's Family Communication: Discussed in detail with the patient, all imaging results, lab results explained to the patient and  husband at bedside    Disposition Plan: possible DC home in a.m. If stroke workup is complete  Time Spent in minutes 25 minutes  Procedures:  MRI/MRA  Consultants:   neurology  Antimicrobials:      Medications  Scheduled Meds: . amLODipine  2.5 mg Oral Daily  . aspirin EC  81 mg Oral Daily  . atorvastatin  20 mg Oral q1800  . cholecalciferol  2,000 Units Oral Daily  . clopidogrel  75 mg Oral Daily  . lisinopril  40 mg Oral QHS  . metoprolol succinate  25 mg Oral Q breakfast   Continuous Infusions: PRN Meds:.hydrALAZINE   Antibiotics   Anti-infectives    None        Subjective:   Maria [name] was seen and examined today. States all the symptoms of perioral numbness, left-sided numbness have resolved prior to admission. Patient denies dizziness, chest pain, shortness of breath, abdominal pain, N/V/D/C, new weakness, numbess, tingling. No acute events overnight.    Objective:   Vitals:   11/07/16 0252 11/07/16 0500 11/07/16 0700 11/07/16 0900  BP: (!) 159/91 (!) 175/52 (!) 176/58   Pulse: 63 64 62   Resp: 16 16  16   Temp: 97.7 F (36.5 C) 97.7 F (36.5 C) 98 F (36.7 C)   TempSrc: Oral Oral Oral Other (Comment)  SpO2: 96% 97% 98%   Weight:       No intake or output data in the 24 hours ending 11/07/16 1241   Wt Readings from Last 3 Encounters:  11/07/16 79.2 kg (174 lb 11.2 oz)  08/05/16 77.2 kg (170 lb 2 oz)  03/06/16 77.1 kg (170 lb)     Exam  General: Alert and oriented x 3, NAD  Eyes:  HEENT:  Atraumatic, normocephalic  Cardiovascular: S1 S2 auscultated, no rubs, murmurs or gallops. Regular rate and rhythm.  Respiratory: Clear to auscultation bilaterally, no wheezing, rales or rhonchi  Gastrointestinal: Soft, nontender, nondistended, + bowel sounds  Ext: no pedal edema bilaterally  Neuro: AAOx3, Cr N's II- XII. Strength 5/5 upper and lower extremities bilaterally, speech clear, sensations grossly intact  Musculoskeletal:  No digital cyanosis, clubbing  Skin: No rashes  Psych: Normal affect and demeanor, alert and oriented x3    Data Reviewed:  I have personally reviewed following labs and imaging studies  Micro Results No results found for this or any previous visit (from the past 240 hour(s)).  Radiology Reports Dg Chest 2 View  Result Date: 11/07/2016 CLINICAL DATA:  Patient seemed a little confused, but was able to answer questions. She said she had a mini stroke early last night. No SOB, no pain in chest. Numbness and tingling in left arm and hand. Slight cough. EXAM: CHEST  2 VIEW COMPARISON:  11/06/2016 FINDINGS: Heart is mildly enlarged. Aorta is mildly calcified. No pulmonary edema. There is question of basilar infiltrate based on the lateral view. The hilar prominence noted on the most recent exam is significantly less apparent, likely due to better lung inflation. IMPRESSION: 1. Stable cardiomegaly. 2. Better lung inflation, improving the appearance of hilar prominence. No evidence for hilar adenopathy. 3. Possible lower lobe infiltrate. Continued follow-up is recommended. When the patient is able, follow-up PA and lateral chest x-ray may be helpful. Electronically Signed   By: Nolon Nations M.D.   On: 11/07/2016 11:14   Dg Chest 2 View  Result Date: 11/06/2016 CLINICAL DATA:  Shortness of breath EXAM: CHEST  2 VIEW COMPARISON:  05/14/2011 FINDINGS: Low lung volumes. Mild diffuse coarse interstitial opacity, increased compared to prior could relate to chronic change although superimposed acute edema cannot be excluded. Borderline to mild cardiomegaly with linear atelectasis at the left base. No pleural effusion. Enlarged pulmonary hila bilaterally. IMPRESSION: 1. Very low lung volumes 2. Borderline cardiomegaly. Mild diffuse interstitial opacity may reflect combination of chronic change and superimposed mild edema 3. Enlarged pulmonary hila bilaterally, possibly due to enlarged pulmonary vessels as may  be seen with pulmonary hypertension, although adenopathy is also a consideration. Short interval radiographic follow-up recommended; alternatively CT could be obtained to further assess Electronically Signed   By: Donavan Foil M.D.   On: 11/06/2016 22:05   Ct Head Wo Contrast  Result Date: 11/06/2016 CLINICAL DATA:  Left-sided facial and hand numbness. EXAM: CT HEAD WITHOUT CONTRAST TECHNIQUE: Contiguous axial images were obtained from the base of the skull through the vertex without intravenous contrast. COMPARISON:  Head CT 10/01/2015 FINDINGS: Brain: No mass lesion, intraparenchymal hemorrhage or extra-axial collection. No evidence of acute cortical infarct. Brain parenchyma and CSF-containing spaces are normal for age. 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 Vascular: No hyperdense vessel or unexpected calcification. Skull: Normal visualized skull base, calvarium and  extracranial soft tissues. Sinuses/Orbits: No sinus fluid levels or advanced mucosal thickening. No mastoid effusion. Normal orbits. IMPRESSION: No acute intracranial abnormality. Mild atrophy, unchanged. Small, old left cerebellar infarct. Electronically Signed   By: Ulyses Jarred M.D.   On: 11/06/2016 22:00   Mr Brain Wo Contrast  Result Date: 11/07/2016 CLINICAL DATA:  Left arm and facial numbness.  Hypertensive urgency. EXAM: MRI HEAD WITHOUT CONTRAST MRA HEAD WITHOUT CONTRAST TECHNIQUE: Multiplanar, multiecho pulse sequences of the brain and surrounding structures were obtained without intravenous contrast. Angiographic images of the head were obtained using MRA technique without contrast. COMPARISON:  Head CT 11/06/2016. Brain MRI 08/03/2015. Head MRA 03/12/2015. FINDINGS: MRI HEAD FINDINGS Brain: There is no evidence of acute infarct, mass, midline shift, or extra-axial fluid collection. There are multiple chronic microhemorrhages located peripherally throughout both cerebral hemispheres, more numerous than on the prior MRI although some  of this discrepancy may be due to differences in technique given the use of susceptibility weighted imaging on the current examination. There are also 2 chronic microhemorrhages in the left cerebellum which were not apparent on the prior MRI. Small foci of T2 hyperintensity scattered throughout the subcortical and deep cerebral white matter are similar to the prior MRI and nonspecific but compatible with mild chronic small vessel ischemic disease. Small chronic left cerebellar and right occipital infarcts are unchanged. Generalized cerebral atrophy is within normal limits for age. Vascular: Major intracranial vascular flow voids are preserved. Skull and upper cervical spine: Unremarkable bone marrow signal. Sinuses/Orbits: Bilateral cataract extraction. Paranasal sinuses and mastoid air cells are clear. Other: None. MRA HEAD FINDINGS The visualized distal vertebral arteries are patent to the basilar and codominant without significant stenosis. Patent left PICA, right AICA, and bilateral SCA origins are identified. The basilar artery is widely patent. Posterior communicating arteries are not identified. The PCAs are patent with an unchanged severe proximal right P2 stenosis. Segmental moderate to severe left mid P2 stenosis is new. The internal carotid arteries are patent from skullbase to carotid termini. Mild left ICA stenosis at the level of the anterior genu is similar to the prior MRA. 1 mm inferiorly directed bulge from the left supraclinoid ICA is unchanged and may represent an infundibulum rather than aneurysm. The ACAs and MCAs are patent without evidence of proximal branch occlusion or flow limiting proximal stenosis. Mild distal right A1 stenosis is similar to the prior study. Decreased number of right MCA branch vessels compared to the left is unchanged. IMPRESSION: 1. No acute intracranial abnormality. 2. Chronic microhemorrhages throughout the cerebral greater than cerebellar hemispheres, likely  increased in number from the 2017 MRI in which may be secondary to chronic hypertension. 3. Mild chronic small vessel ischemic disease. 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 4. No large vessel occlusion. 5. New moderate to severe left P2 stenosis. Unchanged severe right P2 stenosis. Unchanged mild left ICA and right ACA stenoses. Electronically Signed   By: Logan Bores M.D.   On: 11/07/2016 09:04   Mr Jodene Nam Head Wo Contrast  Result Date: 11/07/2016 CLINICAL DATA:  Left arm and facial numbness.  Hypertensive urgency. EXAM: MRI HEAD WITHOUT CONTRAST MRA HEAD WITHOUT CONTRAST TECHNIQUE: Multiplanar, multiecho pulse sequences of the brain and surrounding structures were obtained without intravenous contrast. Angiographic images of the head were obtained using MRA technique without contrast. COMPARISON:  Head CT 11/06/2016. Brain MRI 08/03/2015. Head MRA 03/12/2015. FINDINGS: MRI HEAD FINDINGS Brain: There is no evidence of acute infarct, mass, midline shift, or extra-axial fluid  collection. There are multiple chronic microhemorrhages located peripherally throughout both cerebral hemispheres, more numerous than on the prior MRI although some of this discrepancy may be due to differences in technique given the use of susceptibility weighted imaging on the current examination. There are also 2 chronic microhemorrhages in the left cerebellum which were not apparent on the prior MRI. Small foci of T2 hyperintensity scattered throughout the subcortical and deep cerebral white matter are similar to the prior MRI and nonspecific but compatible with mild chronic small vessel ischemic disease. Small chronic left cerebellar and right occipital infarcts are unchanged. Generalized cerebral atrophy is within normal limits for age. Vascular: Major intracranial vascular flow voids are preserved. Skull and upper cervical spine: Unremarkable bone marrow signal. Sinuses/Orbits: Bilateral cataract extraction. Paranasal  sinuses and mastoid air cells are clear. Other: None. MRA HEAD FINDINGS The visualized distal vertebral arteries are patent to the basilar and codominant without significant stenosis. Patent left PICA, right AICA, and bilateral SCA origins are identified. The basilar artery is widely patent. Posterior communicating arteries are not identified. The PCAs are patent with an unchanged severe proximal right P2 stenosis. Segmental moderate to severe left mid P2 stenosis is new. The internal carotid arteries are patent from skullbase to carotid termini. Mild left ICA stenosis at the level of the anterior genu is similar to the prior MRA. 1 mm inferiorly directed bulge from the left supraclinoid ICA is unchanged and may represent an infundibulum rather than aneurysm. The ACAs and MCAs are patent without evidence of proximal branch occlusion or flow limiting proximal stenosis. Mild distal right A1 stenosis is similar to the prior study. Decreased number of right MCA branch vessels compared to the left is unchanged. IMPRESSION: 1. No acute intracranial abnormality. 2. Chronic microhemorrhages throughout the cerebral greater than cerebellar hemispheres, likely increased in number from the 2017 MRI in which may be secondary to chronic hypertension. 3. Mild chronic small vessel ischemic disease. 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 4. No large vessel occlusion. 5. New moderate to severe left P2 stenosis. Unchanged severe right P2 stenosis. Unchanged mild left ICA and right ACA stenoses. Electronically Signed   By: Logan Bores M.D.   On: 11/07/2016 09:04    Lab Data:  CBC:  Recent Labs Lab 11/06/16 2115 11/06/16 2126 11/07/16 0639  WBC 3.5*  --  3.8*  NEUTROABS 1.9  --   --   HGB 11.7* 12.6 12.2  HCT 36.7 37.0 38.6  MCV 92.7  --  92.8  PLT 187  --  782   Basic Metabolic Panel:  Recent Labs Lab 11/06/16 2115 11/06/16 2126 11/07/16 0616  NA 137 140 140  K 4.4 4.3 3.9  CL 104 103 105    CO2 28  --  28  GLUCOSE 115* 113* 100*  BUN 19 24* 14  CREATININE 0.90 0.90 0.80  CALCIUM 8.6*  --  8.7*   GFR: Estimated Creatinine Clearance: 45.7 mL/min (by C-G formula based on SCr of 0.8 mg/dL). Liver Function Tests:  Recent Labs Lab 11/06/16 2115 11/07/16 0616  AST 29 21  ALT 13* 15  ALKPHOS 79 82  BILITOT 0.5 0.6  PROT 6.1* 5.8*  ALBUMIN 3.8 3.6   No results for input(s): LIPASE, AMYLASE in the last 168 hours. No results for input(s): AMMONIA in the last 168 hours. Coagulation Profile:  Recent Labs Lab 11/06/16 2115  INR 1.07   Cardiac Enzymes: No results for input(s): CKTOTAL, CKMB, CKMBINDEX, TROPONINI in the last 168  hours. BNP (last 3 results) No results for input(s): PROBNP in the last 8760 hours. HbA1C:  Recent Labs  11/07/16 0615  HGBA1C 5.8*   CBG: No results for input(s): GLUCAP in the last 168 hours. Lipid Profile:  Recent Labs  11/07/16 0616  CHOL 178  HDL 51  LDLCALC 112*  TRIG 77  CHOLHDL 3.5   Thyroid Function Tests:  Recent Labs  11/07/16 0624  TSH 4.873*   Anemia Panel:  Recent Labs  11/07/16 0616  VITAMINB12 123*   Urine analysis:    Component Value Date/Time   COLORURINE STRAW (A) 11/06/2016 2200   APPEARANCEUR CLEAR 11/06/2016 2200   LABSPEC 1.006 11/06/2016 2200   PHURINE 7.0 11/06/2016 2200   GLUCOSEU NEGATIVE 11/06/2016 2200   HGBUR NEGATIVE 11/06/2016 2200   BILIRUBINUR NEGATIVE 11/06/2016 2200   KETONESUR NEGATIVE 11/06/2016 2200   PROTEINUR NEGATIVE 11/06/2016 2200   UROBILINOGEN 0.2 05/14/2011 1554   NITRITE NEGATIVE 11/06/2016 2200   LEUKOCYTESUR NEGATIVE 11/06/2016 2200     Jimy Gates M.D. [facility] 11/07/2016, 12:41 PM  Pager: [phone] Between 7am to 7pm - call Pager - [phone]  After 7pm go to [email or web address] - password [email or web address]  Call night coverage person covering after 7pm

## 2016-11-07 NOTE — Progress Notes (Signed)
STROKE TEAM PROGRESS NOTE   HISTORY OF PRESENT ILLNESS (per record) Maria [name] is an 83 y.o. female with hypertension, hyperlipidemia, neuropathy, migraines, ? TIA 3, intracranial atherosclerotic disease on Plavix followed by Dr. Posey Pronto in clinic.  [facility] typical spell characterized by left arm numbness and left facial parasthesia, mild headache and elevated blood pressure 878 systolic.  Neurology was initially called over the phone and history and presentation was concerning for hypertensive urgency and recommended bringing down her blood pressure. The patient was subsequently admitted for blood pressure control. Neurology was then consulted by hospitalist team to to see if she needed further stroke workup given her history of TIAs and old stroke on MRI. Head CT had already been performed and showed no acute abnormality..  She describes the episode began at 7:30 PM where she noticed suddenly her left hand began to tingle, gradually moved up her arm over a few minutes and then stopped after about 15 minutes during the same time she also had numbness tingling in the left lower half face. It appears to be consistent with her previous episodes. Dr. Posey Pronto felt that they were likely TIAs given her severe ICAD, however the stroke team had felt they could be complex migraines   Date last known well: 9.28.18 Time last known well: 7.30 pm tPA Given:  NIHSS 0                                                                                                           Modified Rankin: 0  SUBJECTIVE (INTERVAL HISTORY) No family at the bedside, symptoms resolved   OBJECTIVE Temp:  [97.7 F (36.5 C)-98 F (36.7 C)] 98 F (36.7 C) (09/29 0700) Pulse Rate:  [62-84] 62 (09/29 0700) Cardiac Rhythm: Normal sinus rhythm;Heart block (09/29 0015) Resp:  [16-21] 16 (09/29 0700) BP: (159-196)/(52-108) 176/58 (09/29 0700) SpO2:  [96 %-99 %] 98 % (09/29 0700) Weight:   [174 lb 11.2 oz (79.2 kg)] 174 lb 11.2 oz (79.2 kg) (09/29 0010)  CBC:  Recent Labs Lab 11/06/16 2115 11/06/16 2126 11/07/16 0639  WBC 3.5*  --  3.8*  NEUTROABS 1.9  --   --   HGB 11.7* 12.6 12.2  HCT 36.7 37.0 38.6  MCV 92.7  --  92.8  PLT 187  --  676    Basic Metabolic Panel:  Recent Labs Lab 11/06/16 2115 11/06/16 2126  NA 137 140  K 4.4 4.3  CL 104 103  CO2 28  --   GLUCOSE 115* 113*  BUN 19 24*  CREATININE 0.90 0.90  CALCIUM 8.6*  --     Lipid Panel:    Component Value Date/Time   CHOL 173 03/14/2015 0558   TRIG 111 03/14/2015 0558   HDL 45 03/14/2015 0558   CHOLHDL 3.8 03/14/2015 0558   VLDL 22 03/14/2015 0558   LDLCALC 106 (H) 03/14/2015 0558   HgbA1c:  Lab Results  Component Value Date   HGBA1C 5.9 (H) 03/13/2015   Urine Drug Screen:  Component Value Date/Time   LABOPIA NONE DETECTED 11/06/2016 2200   COCAINSCRNUR NONE DETECTED 11/06/2016 2200   LABBENZ NONE DETECTED 11/06/2016 2200   AMPHETMU NONE DETECTED 11/06/2016 2200   THCU NONE DETECTED 11/06/2016 2200   LABBARB NONE DETECTED 11/06/2016 2200    Alcohol Level     Component Value Date/Time   ETH <10 11/06/2016 2115     IMAGING  MRI brain: 1. No acute intracranial abnormality. 2. Chronic microhemorrhages throughout the cerebral greater than cerebellar hemispheres, likely increased in number from the 2017 MRI in which may be secondary to chronic hypertension. 3. Mild chronic small vessel ischemic disease. 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 4. No large vessel occlusion. 5. New moderate to severe left P2 stenosis. Unchanged severe right P2 stenosis. Unchanged mild left ICA and right ACA stenoses.  Mr Jodene Nam Head Wo Contrast 11/07/2016 IMPRESSION:  1. No acute intracranial abnormality.  2. Chronic microhemorrhages throughout the cerebral greater than cerebellar hemispheres, likely increased in number from the 2017 MRI in which may be secondary to chronic  hypertension.  3. Mild chronic small vessel ischemic disease. 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  4. No large vessel occlusion.  5. New moderate to severe left P2 stenosis. Unchanged severe right P2 stenosis. Unchanged mild left ICA and right ACA stenoses.    Dg Chest 2 View 11/06/2016 IMPRESSION:  1. Very low lung volumes  2. Borderline cardiomegaly. Mild diffuse interstitial opacity may reflect combination of chronic change and superimposed mild edema  3. Enlarged pulmonary hila bilaterally, possibly due to enlarged pulmonary vessels as may be seen with pulmonary hypertension, although adenopathy is also a consideration. Short interval radiographic follow-up recommended; alternatively CT could be obtained to further assess    Ct Head Wo Contrast 11/06/2016 IMPRESSION:  No acute intracranial abnormality. Mild atrophy, unchanged. Small, old left cerebellar infarct.    Transthoracic Echocardiogram - pending   Bilateral Carotid Dopplers  11/07/2016 Bilateral:  1-39% ICA stenosis.  Vertebral artery flow is antegrade.      PHYSICAL EXAM Vitals:   11/07/16 0500 11/07/16 0700 11/07/16 0900 11/07/16 1331  BP: (!) 175/52 (!) 176/58  (!) 172/54  Pulse: 64 62  76  Resp: 16 16  18   Temp: 97.7 F (36.5 C) 98 F (36.7 C)  98.4 F (36.9 C)  TempSrc: Oral Oral Other (Comment) Oral  SpO2: 97% 98%  97%  Weight:       Physical exam: Exam: Gen: NAD, conversant                   Neuro: Detailed Neurologic Exam  Speech:    Speech is normal; fluent and spontaneous with normal comprehension.  Cognition:    The patient is oriented to person, place, and time;  Cranial Nerves:    The pupils are equal, round, and reactive to light. Visual fields are full to finger confrontation. Extraocular movements are intact. Trigeminal sensation is intact and the muscles of mastication are normal. The face is symmetric. The palate elevates in the midline. Hearing intact. Voice is  normal. Shoulder shrug is normal. The tongue has normal motion without fasciculations.   Coordination:    No dysmetria  Motor Observation:    No asymmetry, no atrophy, and no involuntary movements noted.    Strength:    Strength is V/V in the upper and lower limbs.      Sensation: intact to LT     Reflex Exam:   ASSESSMENT/PLAN Ms. Flint Melter  Hamidi is a 83 y.o. female with history of hypertension, hyperlipidemia, peripheral neuropathy, severe carotid artery disease, arthritis, history of migraine headaches, atherosclerotic disease and TIAs 3 presenting with left-sided numbness, mild headache, and elevated blood pressure.  She did not receive IV t-PA due to resolution of deficits.  TIA vs Migraine vs malignant htn  Resultant  Resolution of symptoms  CT head - No acute intracranial abnormality. Small, old left cerebellar infarct.   MRI head - No acute intracranial abnormality. 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   MRA head - New moderate to severe left P2 stenosis. Unchanged severe right P2 stenosis.  Carotid Doppler - Bilateral:  1-39% ICA stenosis.  Vertebral artery flow is antegrade.    2D Echo - pending  LDL - 112  HgbA1c - 5.8  VTE prophylaxis - SCDs  Diet Heart Room service appropriate? Yes; Fluid consistency: Thin  clopidogrel 75 mg daily prior to admission, now on aspirin 81 mg daily and clopidogrel 75 mg daily  Patient counseled to be compliant with her antithrombotic medications  Ongoing aggressive stroke risk factor management  Therapy recommendations: No PT follow-up recommended. OT evaluation is pending.  Disposition: Pending  Hypertension  Blood pressure tends to run high  Permissive hypertension (OK if < 220/120) but gradually normalize in 5-7 days  Long-term BP goal normotensive  Hyperlipidemia  Home meds: No lipid lowering medications prior to admission  LDL 112, goal < 70  Now on Lipitor 20 mg daily  Continue  statin at discharge   Other Stroke Risk Factors  Advanced age  Former cigarette smoker - quit  Obesity, Body mass index is 29.99 kg/m., recommend weight loss, diet and exercise as appropriate   Hx stroke/TIA  Family hx stroke (mother, sister, and brother)  Migraines    Other Active Problems  Enlarged pulmonary hila bilaterally -> f/u recommended (see above)  Hospital day # 0  Personally examined patient and images, performed neuro exam,assessment and plan as stated above.  I have personally obtained the history, evaluated lab date, reviewed imaging studies and agree with radiology interpretations.   We will continue to follow until workup complete, if pending tests unremarkable (TTE) then stroke neuro will sign off with recommendation to discharge on Plavix and asa 81mg  with outpatient neuro follow up.  Sarina Ill, MD Stroke Neurology  To contact Stroke Continuity provider, please refer to [email or web address] After hours, contact General Neurology

## 2016-11-07 NOTE — Consult Note (Signed)
Requesting Physician: Dr. Maudie Mercury    Chief Complaint: Left face and arm paresthesias  History obtained from:  Patient     HPI:                                                                                                                                       Maria [name] is an 83 y.o. female with hypertension, hyperlipidemia, neuropathy, migraines, ? TIA 3, intracranial atherosclerotic disease on Plavix followed by Dr. Posey Pronto in clinic.  She presents to [facility] ERfor another student typical spell characterized by left arm numbness and left facial parasthesia, mild headache and elevated blood pressure 016 systolic.  Neurology was initially called over the phone and history and presentation was concerning for hypertensive urgency and recommended bringing down her blood pressure. The patient was subsequently admitted for blood pressure control. Neurology was then consulted by hospitalist team to to see if she needed further stroke workup given her history of TIAs and old stroke on MRI. Head CT had already been performed and showed no acute abnormality..  She describes the episode began at 7:30 PM where she noticed suddenly her left hand began to tingle, gradually moved up her arm over a few minutes and then stopped after about 15 minutes during the same time she also had numbness tingling in the left lower half face. It appears to be consistent with her previous episodes. Dr. Posey Pronto felt that they were likely TIA given her severe ICAD, however the stroke team had felt they could be complex migraines   Date last known well: 9.28.18 Time last known well: 7.30 pm tPA Given:  NIHSS 0          Modified Rankin: 0  Past Medical History:  Diagnosis Date  . Allergic urticaria   . Arthritis    OA AND PAIN LEFT HIP  . Glaucoma   . Headache(784.0)    HX OF MIGRAINES  . Hypercholesterolemia   . Hypertension   . Osteoporosis   . Peripheral neuropathy   . PONV (postoperative nausea and vomiting)   .  Rib fractures    IN THE PAST  . Shingles 1940'S    Past Surgical History:  Procedure Laterality Date  . ABDOMINAL HYSTERECTOMY  1958  . APPENDECTOMY  1937  . DILATION AND CURETTAGE OF UTERUS     MULTIPLE  . EXTERNAL FIXATOR LEFT ARM FRACTURE 1997    . EYE SURGERY  1998 & 2006   BILATERAL CATARACT EXTRACTION  . JOINT REPLACEMENT  2011   RIGHT TOTAL HIP REPLACEMENT  . TONSILLECTOMY  1934  . TOTAL HIP ARTHROPLASTY  05/27/2011   Procedure: TOTAL HIP ARTHROPLASTY;  Surgeon: Gearlean Alf, MD;  Location: [facility];  Service: Orthopedics;  Laterality: Left;    Family History  Problem Relation Age of Onset  . Heart disease Mother   . Stroke Mother   . Other Father 60  MVA  . Stroke Sister   . Stroke Brother   . Atrial fibrillation Son   . Other Son 12       fire   Social History:  reports that she has quit smoking. She has never used smokeless tobacco. She reports that she does not drink alcohol or use drugs.  Allergies:  Allergies  Allergen Reactions  . Codeine Nausea And Vomiting  . Tylenol [Acetaminophen]     ITCHING  . Ibuprofen Itching and Rash    Medications:                                                                                                                           Reviewed home meds  ROS:                                                                                                                                       14 systems are reviewed and negative   Examination:                                                                                                      General: Appears well-developed and well-nourished.  Psych: Affect appropriate to situation Eyes: No scleral injection HENT: No OP obstrucion Head: Normocephalic.  Cardiovascular: Normal rate and regular rhythm.  Respiratory: Effort normal and breath sounds normal to anterior ascultation GI: Soft.  No distension. There is no tenderness.  Skin: WDI   Neurological  Examination Mental Status: Alert, oriented, thought content appropriate.  Speech fluent without evidence of aphasia.  Able to follow 3 step commands without difficulty. Cranial Nerves: II: Discs flat bilaterally; Visual fields grossly normal,  III,IV, VI: ptosis not present, extra-ocular motions intact bilaterally, pupils equal, round, reactive to light and accommodation V,VII: smile symmetric, facial light touch sensation normal bilaterally VIII: hearing normal bilaterally IX,X: uvula rises symmetrically XI: bilateral shoulder shrug XII: midline tongue extension Motor: Right : Upper extremity   5/5  Left:     Upper extremity   5/5  Lower extremity   5/5     Lower extremity   5/5 Tone and bulk:normal tone throughout; no atrophy noted Sensory: Pinprick and light touch intact throughout, bilaterally Deep Tendon Reflexes: 2+ and symmetric throughout Plantars: Right: downgoing   Left: downgoing Cerebellar: normal finger-to-nose, normal rapid alternating movements and normal heel-to-shin test Gait: normal gait and station     Lab Results: Basic Metabolic Panel:  Recent Labs Lab 11/06/16 2115 11/06/16 2126  NA 137 140  K 4.4 4.3  CL 104 103  CO2 28  --   GLUCOSE 115* 113*  BUN 19 24*  CREATININE 0.90 0.90  CALCIUM 8.6*  --     CBC:  Recent Labs Lab 11/06/16 2115 11/06/16 2126  WBC 3.5*  --   NEUTROABS 1.9  --   HGB 11.7* 12.6  HCT 36.7 37.0  MCV 92.7  --   PLT 187  --     Coagulation Studies:  Recent Labs  11/06/16 2115  LABPROT 13.8  INR 1.07    Imaging: Dg Chest 2 View  Result Date: 11/06/2016 CLINICAL DATA:  Shortness of breath EXAM: CHEST  2 VIEW COMPARISON:  05/14/2011 FINDINGS: Low lung volumes. Mild diffuse coarse interstitial opacity, increased compared to prior could relate to chronic change although superimposed acute edema cannot be excluded. Borderline to mild cardiomegaly with linear atelectasis at the left base. No pleural effusion.  Enlarged pulmonary hila bilaterally. IMPRESSION: 1. Very low lung volumes 2. Borderline cardiomegaly. Mild diffuse interstitial opacity may reflect combination of chronic change and superimposed mild edema 3. Enlarged pulmonary hila bilaterally, possibly due to enlarged pulmonary vessels as may be seen with pulmonary hypertension, although adenopathy is also a consideration. Short interval radiographic follow-up recommended; alternatively CT could be obtained to further assess Electronically Signed   By: Donavan Foil M.D.   On: 11/06/2016 22:05   Ct Head Wo Contrast  Result Date: 11/06/2016 CLINICAL DATA:  Left-sided facial and hand numbness. EXAM: CT HEAD WITHOUT CONTRAST TECHNIQUE: Contiguous axial images were obtained from the base of the skull through the vertex without intravenous contrast. COMPARISON:  Head CT 10/01/2015 FINDINGS: Brain: No mass lesion, intraparenchymal hemorrhage or extra-axial collection. No evidence of acute cortical infarct. Brain parenchyma and CSF-containing spaces are normal for age. 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 Vascular: No hyperdense vessel or unexpected calcification. Skull: Normal visualized skull base, calvarium and extracranial soft tissues. Sinuses/Orbits: No sinus fluid levels or advanced mucosal thickening. No mastoid effusion. Normal orbits. IMPRESSION: No acute intracranial abnormality. Mild atrophy, unchanged. Small, old left cerebellar infarct. Electronically Signed   By: Ulyses Jarred M.D.   On: 11/06/2016 22:00     ASSESSMENT AND PLAN   83 y.o. female with hypertension, hyperlipidemia, neuropathy, migraines, intracranial atherosclerotic disease, old on Plavix who presents for the fourth time with the posterior typical spell of left face and arm paresthesia I reviewed the previous admissions and they all appear present in the setting of high blood pressure of 161W systolic which would be somewhat unusual for migraines.  Based on her MR angiogram, he has  diffuse iCAD but the only location that could possibly explain her symptoms is the area of  focal narrowing in the distal P1- P2  which may be giving her thalamic symptoms. Management for symptomatic iCAD is dual antiplatelets. However given her minor symptoms and the absence of any strokes in that area that would cause her clinical symptoms I am  not sure of dual antiplatelets is the right choice.  I will defer this decision to the stroke team as well as need for further imaging. The most important intervention is adequate control of blood pressure for prevention of another stroke.   Continue Plavix Reduced BP gradually to 140-150/90 Neuro[facility]  Sushanth Aroor [facility] Pager Number [phone]

## 2016-11-08 ENCOUNTER — Inpatient Hospital Stay (HOSPITAL_COMMUNITY): Payer: Medicare Other

## 2016-11-08 DIAGNOSIS — I160 Hypertensive urgency: Secondary | ICD-10-CM | POA: Diagnosis not present

## 2016-11-08 DIAGNOSIS — R202 Paresthesia of skin: Secondary | ICD-10-CM | POA: Diagnosis not present

## 2016-11-08 DIAGNOSIS — E785 Hyperlipidemia, unspecified: Secondary | ICD-10-CM | POA: Diagnosis not present

## 2016-11-08 DIAGNOSIS — R200 Anesthesia of skin: Secondary | ICD-10-CM | POA: Diagnosis not present

## 2016-11-08 DIAGNOSIS — I10 Essential (primary) hypertension: Secondary | ICD-10-CM | POA: Diagnosis not present

## 2016-11-08 DIAGNOSIS — G458 Other transient cerebral ischemic attacks and related syndromes: Secondary | ICD-10-CM | POA: Diagnosis not present

## 2016-11-08 LAB — ECHOCARDIOGRAM COMPLETE
HEIGHTINCHES: 64 in
WEIGHTICAEL: 2795.2 [oz_av]

## 2016-11-08 NOTE — Progress Notes (Signed)
  Echocardiogram 2D Echocardiogram has been performed.  Maria [name] 11/08/2016, 10:42 AM

## 2016-11-08 NOTE — Progress Notes (Signed)
Discharge instructions given to patient with spouse at chairside.  All questions and concerns addressed.  Patient left unit by wheelchair with all belongings.

## 2016-11-08 NOTE — Discharge Summary (Signed)
Physician Discharge Summary   Patient ID: Maria [name] MRN: [id number] DOB/AGE: 1922/02/25 83 y.o.  Admit date: 11/06/2016 Discharge date: 11/08/2016  Primary Care Physician:  Maria Gravel, MD  Discharge Diagnoses:    . TIA (transient ischemic attack) . Essential hypertension . Hyperlipidemia   Consults: neurology  Recommendations for Outpatient Follow-up:  1. PT recommended no PT follow-up, patient back at baseline 2. Please repeat CBC/BMET at next visit 3. Chest x-ray showed enlarged pulmonary hila bilaterally due to enlarged pulmonary vessels may be seen in pulmonary hypertension although adenopathy is also consideration, short interval radiographic follow-up recommended alternatively CT could be done for further assessment  DIET:heart healthy diet    Allergies:   Allergies  Allergen Reactions  . Codeine Nausea And Vomiting  . Tylenol [Acetaminophen]     ITCHING  . Ibuprofen Itching and Rash     DISCHARGE MEDICATIONS: Current Discharge Medication List    START taking these medications   Details  amLODipine (NORVASC) 2.5 MG tablet Take 1 tablet (2.5 mg total) by mouth daily. Qty: 30 tablet, Refills: 3    aspirin EC 81 MG EC tablet Take 1 tablet (81 mg total) by mouth daily. Qty: 30 tablet, Refills: 4    atorvastatin (LIPITOR) 20 MG tablet Take 1 tablet (20 mg total) by mouth at bedtime. Qty: 30 tablet, Refills: 4      CONTINUE these medications which have CHANGED   Details  lisinopril (PRINIVIL,ZESTRIL) 40 MG tablet Take 1 tablet (40 mg total) by mouth daily. Qty: 30 tablet, Refills: 4      CONTINUE these medications which have NOT CHANGED   Details  Cholecalciferol (VITAMIN D3) 2000 UNITS TABS Take 2,000 Units by mouth daily.    clopidogrel (PLAVIX) 75 MG tablet TAKE ONE TABLET BY MOUTH ONCE DAILY Qty: 30 tablet, Refills: 11    Cyanocobalamin (VITAMIN B-12 PO) Take 1 tablet by mouth daily. Gummy    metoprolol succinate (TOPROL-XL) 25 MG 24  hr tablet Take 1 tablet (25 mg total) by mouth daily with breakfast. Qty: 30 tablet, Refills: 0    Multiple Vitamins-Minerals (PRESERVISION AREDS PO) Take 1 tablet by mouth 2 (two) times daily.    triamcinolone cream (KENALOG) 0.5 % Apply 1 application topically 2 (two) times daily as needed (rash).          Brief H and P: For complete details please refer to admission H and P, but in brief Maria [name] a 83 y.o.female,with hypertension,hyperlipidemia, neuropathy, migraines, prior TIA presents with c/o left arm numbness, tingling and perioral numbness, tingling. Started about 6:30pm. In  ED CT brain was negative, BP 196/94. Patient was admitted for possible CVA/TIA  Hospital Course:  TIA (transient ischemic attack) presented with left arm numbness, tingling, perioral numbness and tingling resolved within a 1 minute - MRI of the brain showed no acute intracranial abnormality, chronic microhemorrhages hrough out the cerebral, greater than cerebellar hemispheres, likely increased from 2017 may be secondary to chronic hypertension - MRA showed no large vessel occlusion, new moderate to severe left P2 stenosis, unchanged severe right P2 stenosis, unchanged mild left ICA and right ACA stenosis - LDL 112, Placed on Lipitor - Carotid Dopplers showed 1-39% ICA stenosis - neurology consulted, placed on aspirin 81 mg daily with Plavix - PT recommended home PT follow-up, back to baseline - 2-D echo showed EF of 65-70% with grade 1 diastolic dysfunction, no regional wall motion abnormalities. . Chest x-ray showed enlarged pulmonary hila bilaterally due to enlarged pulmonary vessels may  be seen in pulmonary hypertension although adenopathy is also consideration, short interval radiographic follow-up recommended alternatively CT could be done for further assessment     Hyperlipidemia - LDL 112, Placed on Lipitor    Essential hypertension/ hypertensive urgency - BP uncontrolled, added  amlodipine 2.5 mg daily, increased lisinopril to 40 mg daily, metoprolol 25 mg daily    Day of Discharge BP (!) 121/51 (BP Location: Left Arm)   Pulse 78   Temp 98.2 F (36.8 C) (Oral)   Resp 16   Ht 5\' 4"  (1.626 m)   Wt 79.2 kg (174 lb 11.2 oz)   SpO2 95%   BMI 29.99 kg/m   Physical Exam: General: Alert and awake oriented x3 not in any acute distress. HEENT: anicteric sclera, pupils reactive to light and accommodation CVS: S1-S2 clear no murmur rubs or gallops Chest: clear to auscultation bilaterally, no wheezing rales or rhonchi Abdomen: soft nontender, nondistended, normal bowel sounds Extremities: no cyanosis, clubbing or edema noted bilaterally Neuro: Cranial nerves II-XII intact, no focal neurological deficits   The results of significant diagnostics from this hospitalization (including imaging, microbiology, ancillary and laboratory) are listed below for reference.    LAB RESULTS: Basic Metabolic Panel:  Recent Labs Lab 11/06/16 2115 11/06/16 2126 11/07/16 0616  NA 137 140 140  K 4.4 4.3 3.9  CL 104 103 105  CO2 28  --  28  GLUCOSE 115* 113* 100*  BUN 19 24* 14  CREATININE 0.90 0.90 0.80  CALCIUM 8.6*  --  8.7*   Liver Function Tests:  Recent Labs Lab 11/06/16 2115 11/07/16 0616  AST 29 21  ALT 13* 15  ALKPHOS 79 82  BILITOT 0.5 0.6  PROT 6.1* 5.8*  ALBUMIN 3.8 3.6   No results for input(s): LIPASE, AMYLASE in the last 168 hours. No results for input(s): AMMONIA in the last 168 hours. CBC:  Recent Labs Lab 11/06/16 2115 11/06/16 2126 11/07/16 0639  WBC 3.5*  --  3.8*  NEUTROABS 1.9  --   --   HGB 11.7* 12.6 12.2  HCT 36.7 37.0 38.6  MCV 92.7  --  92.8  PLT 187  --  182   Cardiac Enzymes: No results for input(s): CKTOTAL, CKMB, CKMBINDEX, TROPONINI in the last 168 hours. BNP: Invalid input(s): POCBNP CBG: No results for input(s): GLUCAP in the last 168 hours.  Significant Diagnostic Studies:  Dg Chest 2 View  Result Date:  11/07/2016 CLINICAL DATA:  Patient seemed a little confused, but was able to answer questions. She said she had a mini stroke early last night. No SOB, no pain in chest. Numbness and tingling in left arm and hand. Slight cough. EXAM: CHEST  2 VIEW COMPARISON:  11/06/2016 FINDINGS: Heart is mildly enlarged. Aorta is mildly calcified. No pulmonary edema. There is question of basilar infiltrate based on the lateral view. The hilar prominence noted on the most recent exam is significantly less apparent, likely due to better lung inflation. IMPRESSION: 1. Stable cardiomegaly. 2. Better lung inflation, improving the appearance of hilar prominence. No evidence for hilar adenopathy. 3. Possible lower lobe infiltrate. Continued follow-up is recommended. When the patient is able, follow-up PA and lateral chest x-ray may be helpful. Electronically Signed   By: Nolon Nations M.D.   On: 11/07/2016 11:14   Dg Chest 2 View  Result Date: 11/06/2016 CLINICAL DATA:  Shortness of breath EXAM: CHEST  2 VIEW COMPARISON:  05/14/2011 FINDINGS: Low lung volumes. Mild diffuse coarse interstitial opacity, increased  compared to prior could relate to chronic change although superimposed acute edema cannot be excluded. Borderline to mild cardiomegaly with linear atelectasis at the left base. No pleural effusion. Enlarged pulmonary hila bilaterally. IMPRESSION: 1. Very low lung volumes 2. Borderline cardiomegaly. Mild diffuse interstitial opacity may reflect combination of chronic change and superimposed mild edema 3. Enlarged pulmonary hila bilaterally, possibly due to enlarged pulmonary vessels as may be seen with pulmonary hypertension, although adenopathy is also a consideration. Short interval radiographic follow-up recommended; alternatively CT could be obtained to further assess Electronically Signed   By: Donavan Foil M.D.   On: 11/06/2016 22:05   Ct Head Wo Contrast  Result Date: 11/06/2016 CLINICAL DATA:  Left-sided facial  and hand numbness. EXAM: CT HEAD WITHOUT CONTRAST TECHNIQUE: Contiguous axial images were obtained from the base of the skull through the vertex without intravenous contrast. COMPARISON:  Head CT 10/01/2015 FINDINGS: Brain: No mass lesion, intraparenchymal hemorrhage or extra-axial collection. No evidence of acute cortical infarct. Brain parenchyma and CSF-containing spaces are normal for age. 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 Vascular: No hyperdense vessel or unexpected calcification. Skull: Normal visualized skull base, calvarium and extracranial soft tissues. Sinuses/Orbits: No sinus fluid levels or advanced mucosal thickening. No mastoid effusion. Normal orbits. IMPRESSION: No acute intracranial abnormality. Mild atrophy, unchanged. Small, old left cerebellar infarct. Electronically Signed   By: Ulyses Jarred M.D.   On: 11/06/2016 22:00   Mr Brain Wo Contrast  Result Date: 11/07/2016 CLINICAL DATA:  Left arm and facial numbness.  Hypertensive urgency. EXAM: MRI HEAD WITHOUT CONTRAST MRA HEAD WITHOUT CONTRAST TECHNIQUE: Multiplanar, multiecho pulse sequences of the brain and surrounding structures were obtained without intravenous contrast. Angiographic images of the head were obtained using MRA technique without contrast. COMPARISON:  Head CT 11/06/2016. Brain MRI 08/03/2015. Head MRA 03/12/2015. FINDINGS: MRI HEAD FINDINGS Brain: There is no evidence of acute infarct, mass, midline shift, or extra-axial fluid collection. There are multiple chronic microhemorrhages located peripherally throughout both cerebral hemispheres, more numerous than on the prior MRI although some of this discrepancy may be due to differences in technique given the use of susceptibility weighted imaging on the current examination. There are also 2 chronic microhemorrhages in the left cerebellum which were not apparent on the prior MRI. Small foci of T2 hyperintensity scattered throughout the subcortical and deep cerebral white  matter are similar to the prior MRI and nonspecific but compatible with mild chronic small vessel ischemic disease. Small chronic left cerebellar and right occipital infarcts are unchanged. Generalized cerebral atrophy is within normal limits for age. Vascular: Major intracranial vascular flow voids are preserved. Skull and upper cervical spine: Unremarkable bone marrow signal. Sinuses/Orbits: Bilateral cataract extraction. Paranasal sinuses and mastoid air cells are clear. Other: None. MRA HEAD FINDINGS The visualized distal vertebral arteries are patent to the basilar and codominant without significant stenosis. Patent left PICA, right AICA, and bilateral SCA origins are identified. The basilar artery is widely patent. Posterior communicating arteries are not identified. The PCAs are patent with an unchanged severe proximal right P2 stenosis. Segmental moderate to severe left mid P2 stenosis is new. The internal carotid arteries are patent from skullbase to carotid termini. Mild left ICA stenosis at the level of the anterior genu is similar to the prior MRA. 1 mm inferiorly directed bulge from the left supraclinoid ICA is unchanged and may represent an infundibulum rather than aneurysm. The ACAs and MCAs are patent without evidence of proximal branch occlusion or flow limiting proximal stenosis. Mild  distal right A1 stenosis is similar to the prior study. Decreased number of right MCA branch vessels compared to the left is unchanged. IMPRESSION: 1. No acute intracranial abnormality. 2. Chronic microhemorrhages throughout the cerebral greater than cerebellar hemispheres, likely increased in number from the 2017 MRI in which may be secondary to chronic hypertension. 3. Mild chronic small vessel ischemic disease. 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 4. No large vessel occlusion. 5. New moderate to severe left P2 stenosis. Unchanged severe right P2 stenosis. Unchanged mild left ICA and right ACA  stenoses. Electronically Signed   By: Logan Bores M.D.   On: 11/07/2016 09:04   Mr Jodene Nam Head Wo Contrast  Result Date: 11/07/2016 CLINICAL DATA:  Left arm and facial numbness.  Hypertensive urgency. EXAM: MRI HEAD WITHOUT CONTRAST MRA HEAD WITHOUT CONTRAST TECHNIQUE: Multiplanar, multiecho pulse sequences of the brain and surrounding structures were obtained without intravenous contrast. Angiographic images of the head were obtained using MRA technique without contrast. COMPARISON:  Head CT 11/06/2016. Brain MRI 08/03/2015. Head MRA 03/12/2015. FINDINGS: MRI HEAD FINDINGS Brain: There is no evidence of acute infarct, mass, midline shift, or extra-axial fluid collection. There are multiple chronic microhemorrhages located peripherally throughout both cerebral hemispheres, more numerous than on the prior MRI although some of this discrepancy may be due to differences in technique given the use of susceptibility weighted imaging on the current examination. There are also 2 chronic microhemorrhages in the left cerebellum which were not apparent on the prior MRI. Small foci of T2 hyperintensity scattered throughout the subcortical and deep cerebral white matter are similar to the prior MRI and nonspecific but compatible with mild chronic small vessel ischemic disease. Small chronic left cerebellar and right occipital infarcts are unchanged. Generalized cerebral atrophy is within normal limits for age. Vascular: Major intracranial vascular flow voids are preserved. Skull and upper cervical spine: Unremarkable bone marrow signal. Sinuses/Orbits: Bilateral cataract extraction. Paranasal sinuses and mastoid air cells are clear. Other: None. MRA HEAD FINDINGS The visualized distal vertebral arteries are patent to the basilar and codominant without significant stenosis. Patent left PICA, right AICA, and bilateral SCA origins are identified. The basilar artery is widely patent. Posterior communicating arteries are not  identified. The PCAs are patent with an unchanged severe proximal right P2 stenosis. Segmental moderate to severe left mid P2 stenosis is new. The internal carotid arteries are patent from skullbase to carotid termini. Mild left ICA stenosis at the level of the anterior genu is similar to the prior MRA. 1 mm inferiorly directed bulge from the left supraclinoid ICA is unchanged and may represent an infundibulum rather than aneurysm. The ACAs and MCAs are patent without evidence of proximal branch occlusion or flow limiting proximal stenosis. Mild distal right A1 stenosis is similar to the prior study. Decreased number of right MCA branch vessels compared to the left is unchanged. IMPRESSION: 1. No acute intracranial abnormality. 2. Chronic microhemorrhages throughout the cerebral greater than cerebellar hemispheres, likely increased in number from the 2017 MRI in which may be secondary to chronic hypertension. 3. Mild chronic small vessel ischemic disease. 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 4. No large vessel occlusion. 5. New moderate to severe left P2 stenosis. Unchanged severe right P2 stenosis. Unchanged mild left ICA and right ACA stenoses. Electronically Signed   By: Logan Bores M.D.   On: 11/07/2016 09:04    2D ECHO: Study Conclusions  - Left ventricle: The cavity size was normal. Wall thickness was   increased  in a pattern of moderate LVH. Systolic function was   vigorous. The estimated ejection fraction was in the range of 65%   to 70%. Wall motion was normal; there were no regional wall   motion abnormalities. Doppler parameters are consistent with   abnormal left ventricular relaxation (grade 1 diastolic   dysfunction). - Mitral valve: Calcified annulus. Mildly calcified leaflets .   There was mild regurgitation. Valve area by continuity equation   (using LVOT flow): 2.39 cm^2. - Left atrium: The atrium was moderately dilated. - Atrial septum: There was increased  thickness of the septum,   consistent with lipomatous hypertrophy. - Pulmonary arteries: Systolic pressure was mildly increased. PA   peak pressure: 33 mm Hg (S).  -------------------------------------------------------------------   Disposition and Follow-up: Discharge Instructions    Diet - low sodium heart healthy    Complete by:  As directed    Increase activity slowly    Complete by:  As directed        [place]    Melvenia Beam, MD. Schedule an appointment as soon as possible for a visit in 6 week(s).   Specialty:  Neurology Contact information: [address] [place] [address] [phone]        Maria Gravel, MD. Schedule an appointment as soon as possible for a visit in 2 week(s).   Specialty:  Internal Medicine Contact information: [address] [place] [address] [phone]            Time spent on Discharge: 50mins   Signed:   Estill Cotta M.D. [facility] 11/08/2016, 1:58 PM Pager: [phone]

## 2016-11-08 NOTE — Progress Notes (Signed)
SLP Cancellation Note  Patient Details Name: JALISHA ENNEKING MRN: [id number] DOB: 04-14-1922   Cancelled treatment:       Reason Eval/Treat Not Completed: SLP screened, no needs identified, will sign off   Juan Quam Laurice 11/08/2016, 2:44 PM

## 2016-11-08 NOTE — Care Management CC44 (Signed)
Condition Code 44 Documentation Completed  Patient Details  Name: Maria [name] MRN: [id number] Date of Birth: July 29, 1922   Condition Code 44 given:  Yes Patient signature on Condition Code 44 notice:  Yes Documentation of 2 MD's agreement:  Yes Code 44 added to claim:  Yes    Dawayne Patricia, RN 11/08/2016, 1:20 PM

## 2016-11-08 NOTE — Discharge Instructions (Signed)
How to Take Your Blood Pressure  Blood pressure is a measurement of how strongly your blood is pressing against the walls of your arteries. Arteries are blood vessels that carry blood from your heart throughout your body. Your health care provider takes your blood pressure at each office visit. You can also take your own blood pressure at home with a blood pressure machine. You may need to take your own blood pressure:   To confirm a diagnosis of high blood pressure (hypertension).   To monitor your blood pressure over time.   To make sure your blood pressure medicine is working.    Supplies needed:  To take your blood pressure, you will need a blood pressure machine. You can buy a blood pressure machine, or blood pressure monitor, at most drugstores or online. There are several types of home blood pressure monitors. When choosing one, consider the following:   Choose a monitor that has an arm cuff.   Choose a monitor that wraps snugly around your upper arm. You should be able to fit only one finger between your arm and the cuff.   Do not choose a monitor that measures your blood pressure from your wrist or finger.    Your health care provider can suggest a reliable monitor that will meet your needs.  How to prepare  To get the most accurate reading, avoid the following for 30 minutes before you check your blood pressure:   Drinking caffeine.   Drinking alcohol.   Eating.   Smoking.   Exercising.    Five minutes before you check your blood pressure:   Empty your bladder.   Sit quietly without talking in a dining chair, rather than in a soft couch or armchair.    How to take your blood pressure  To check your blood pressure, follow the instructions in the manual that came with your blood pressure monitor. If you have a digital blood pressure monitor, the instructions may be as follows:  1. Sit up straight.  2. Place your feet on the floor. Do not cross your ankles or legs.  3. Rest your left arm at the  level of your heart on a table or desk or on the arm of a chair.  4. Pull up your shirt sleeve.  5. Wrap the blood pressure cuff around the upper part of your left arm, 1 inch (2.5 cm) above your elbow. It is best to wrap the cuff around bare skin.  6. Fit the cuff snugly around your arm. You should be able to place only one finger between the cuff and your arm.  7. Position the cord inside the groove of your elbow.  8. Press the power button.  9. Sit quietly while the cuff inflates and deflates.  10. Read the digital reading on the monitor screen and write it down (record it).  11. Wait 2-3 minutes, then repeat the steps, starting at step 1.    What does my blood pressure reading mean?  A blood pressure reading consists of a higher number over a lower number. Ideally, your blood pressure should be below 120/80. The first ("top") number is called the systolic pressure. It is a measure of the pressure in your arteries as your heart beats. The second ("bottom") number is called the diastolic pressure. It is a measure of the pressure in your arteries as the heart relaxes.  Blood pressure is classified into four stages. The following are the stages for adults who do   not have a short-term serious illness or a chronic condition. Systolic pressure and diastolic pressure are measured in a unit called mm Hg.  Normal   Systolic pressure: below 120.   Diastolic pressure: below 80.  Elevated   Systolic pressure: 120-129.   Diastolic pressure: below 80.  Hypertension stage 1   Systolic pressure: 130-139.   Diastolic pressure: 80-89.  Hypertension stage 2   Systolic pressure: 140 or above.   Diastolic pressure: 90 or above.  You can have prehypertension or hypertension even if only the systolic or only the diastolic number in your reading is higher than normal.  Follow these instructions at home:   Check your blood pressure as often as recommended by your health care provider.   Take your monitor to the next appointment  with your health care provider to make sure:  ? That you are using it correctly.  ? That it provides accurate readings.   Be sure you understand what your goal blood pressure numbers are.   Tell your health care provider if you are having any side effects from blood pressure medicine.  Contact a health care provider if:   Your blood pressure is consistently high.  Get help right away if:   Your systolic blood pressure is higher than 180.   Your diastolic blood pressure is higher than 110.  This information is not intended to replace advice given to you by your health care provider. Make sure you discuss any questions you have with your health care provider.  Document Released: 07/05/2015 Document Revised: 09/17/2015 Document Reviewed: 07/05/2015  [employer] Interactive Patient Education  2018 [employer]

## 2016-11-08 NOTE — Progress Notes (Signed)
STROKE TEAM PROGRESS NOTE   HISTORY OF PRESENT ILLNESS (per record) SHIVANGI LUTZ is an 83 y.o. female with hypertension, hyperlipidemia, neuropathy, migraines, ? TIA 3, intracranial atherosclerotic disease on Plavix followed by Dr. Posey Pronto in clinic.  [facility] typical spell characterized by left arm numbness and left facial parasthesia, mild headache and elevated blood pressure 341 systolic.  Neurology was initially called over the phone and history and presentation was concerning for hypertensive urgency and recommended bringing down her blood pressure. The patient was subsequently admitted for blood pressure control. Neurology was then consulted by hospitalist team to to see if she needed further stroke workup given her history of TIAs and old stroke on MRI. Head CT had already been performed and showed no acute abnormality..  She describes the episode began at 7:30 PM where she noticed suddenly her left hand began to tingle, gradually moved up her arm over a few minutes and then stopped after about 15 minutes during the same time she also had numbness tingling in the left lower half face. It appears to be consistent with her previous episodes. Dr. Posey Pronto felt that they were likely TIAs given her severe ICAD, however the stroke team had felt they could be complex migraines   Date last known well: 9.28.18 Time last known well: 7.30 pm tPA Given:  NIHSS 0                                                                                                           Modified Rankin: 0  SUBJECTIVE (INTERVAL HISTORY) Husband at bedside and she says she is waiting to go home, she is sitting in chair comfortable   OBJECTIVE Temp:  [97.6 F (36.4 C)-98.4 F (36.9 C)] 98.1 F (36.7 C) (09/30 0544) Pulse Rate:  [63-81] 71 (09/30 0544) Cardiac Rhythm: Normal sinus rhythm (09/30 0700) Resp:  [16-18] 16 (09/30 0544) BP: (126-180)/(54-85) 128/73 (09/30 0544) SpO2:   [95 %-99 %] 95 % (09/30 0544)  CBC:   Recent Labs Lab 11/06/16 2115 11/06/16 2126 11/07/16 0639  WBC 3.5*  --  3.8*  NEUTROABS 1.9  --   --   HGB 11.7* 12.6 12.2  HCT 36.7 37.0 38.6  MCV 92.7  --  92.8  PLT 187  --  937    Basic Metabolic Panel:   Recent Labs Lab 11/06/16 2115 11/06/16 2126 11/07/16 0616  NA 137 140 140  K 4.4 4.3 3.9  CL 104 103 105  CO2 28  --  28  GLUCOSE 115* 113* 100*  BUN 19 24* 14  CREATININE 0.90 0.90 0.80  CALCIUM 8.6*  --  8.7*    Lipid Panel:     Component Value Date/Time   CHOL 178 11/07/2016 0616   TRIG 77 11/07/2016 0616   HDL 51 11/07/2016 0616   CHOLHDL 3.5 11/07/2016 0616   VLDL 15 11/07/2016 0616   LDLCALC 112 (H) 11/07/2016 0616   HgbA1c:  Lab Results  Component Value Date   HGBA1C 5.8 (H) 11/07/2016  Urine Drug Screen:     Component Value Date/Time   LABOPIA NONE DETECTED 11/06/2016 2200   COCAINSCRNUR NONE DETECTED 11/06/2016 2200   LABBENZ NONE DETECTED 11/06/2016 2200   AMPHETMU NONE DETECTED 11/06/2016 2200   THCU NONE DETECTED 11/06/2016 2200   LABBARB NONE DETECTED 11/06/2016 2200    Alcohol Level     Component Value Date/Time   ETH <10 11/06/2016 2115     IMAGING  MRI brain: 1. No acute intracranial abnormality. 2. Chronic microhemorrhages throughout the cerebral greater than cerebellar hemispheres, likely increased in number from the 2017 MRI in which may be secondary to chronic hypertension. 3. Mild chronic small vessel ischemic disease. 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 4. No large vessel occlusion. 5. New moderate to severe left P2 stenosis. Unchanged severe right P2 stenosis. Unchanged mild left ICA and right ACA stenoses.  Mr Jodene Nam Head Wo Contrast 11/07/2016 IMPRESSION:  1. No acute intracranial abnormality.  2. Chronic microhemorrhages throughout the cerebral greater than cerebellar hemispheres, likely increased in number from the 2017 MRI in which may be secondary  to chronic hypertension.  3. Mild chronic small vessel ischemic disease. 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  4. No large vessel occlusion.  5. New moderate to severe left P2 stenosis. Unchanged severe right P2 stenosis. Unchanged mild left ICA and right ACA stenoses.    Dg Chest 2 View 11/06/2016 IMPRESSION:  1. Very low lung volumes  2. Borderline cardiomegaly. Mild diffuse interstitial opacity may reflect combination of chronic change and superimposed mild edema  3. Enlarged pulmonary hila bilaterally, possibly due to enlarged pulmonary vessels as may be seen with pulmonary hypertension, although adenopathy is also a consideration. Short interval radiographic follow-up recommended; alternatively CT could be obtained to further assess    Ct Head Wo Contrast 11/06/2016 IMPRESSION:  No acute intracranial abnormality. Mild atrophy, unchanged. Small, old left cerebellar infarct.    Transthoracic Echocardiogram - pending   Bilateral Carotid Dopplers  11/07/2016 Bilateral:  1-39% ICA stenosis.  Vertebral artery flow is antegrade.      PHYSICAL EXAM Vitals:   11/07/16 2115 11/07/16 2208 11/08/16 0227 11/08/16 0544  BP: (!) 180/85  (!) 154/76 128/73  Pulse: 63  65 71  Resp: 16  16 16   Temp: 97.6 F (36.4 C)  97.6 F (36.4 C) 98.1 F (36.7 C)  TempSrc: Oral  Oral Oral  SpO2: 97%  96% 95%  Weight:      Height:  5\' 4"  (1.626 m)     Physical exam: Exam: Gen: NAD, conversant                   Neuro: Detailed Neurologic Exam  Speech:    Speech is normal; fluent and spontaneous with normal comprehension.  Cognition:    The patient is oriented to person, place, and time;  Cranial Nerves:    The pupils are equal, round, and reactive to light. Visual fields are full to finger confrontation. Extraocular movements are intact. Trigeminal sensation is intact and the muscles of mastication are normal. The face is symmetric. The palate elevates in the midline. Hearing  intact. Voice is normal. Shoulder shrug is normal. The tongue has normal motion without fasciculations.   Coordination:    No dysmetria  Motor Observation:    No asymmetry, no atrophy, and no involuntary movements noted.    Strength:    Strength is V/V in the upper and lower limbs.      Sensation: intact  to LT     Reflex Exam:   ASSESSMENT/PLAN Ms. JONEISHA MILES is a 83 y.o. female with history of hypertension, hyperlipidemia, peripheral neuropathy, severe carotid artery disease, arthritis, history of migraine headaches, atherosclerotic disease and TIAs 3 presenting with left-sided numbness, mild headache, and elevated blood pressure.  She did not receive IV t-PA due to resolution of deficits.  TIA vs Migraine vs malignant htn  Resultant  Resolution of symptoms  CT head - No acute intracranial abnormality. Small, old left cerebellar infarct.   MRI head - No acute intracranial abnormality. 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   MRA head - New moderate to severe left P2 stenosis. Unchanged severe right P2 stenosis.  Carotid Doppler - Bilateral:  1-39% ICA stenosis.  Vertebral artery flow is antegrade.    2D Echo - pending  LDL - 112  HgbA1c - 5.8  VTE prophylaxis - SCDs Diet Heart Room service appropriate? Yes; Fluid consistency: Thin  clopidogrel 75 mg daily prior to admission, now on aspirin 81 mg daily and clopidogrel 75 mg daily  Patient counseled to be compliant with her antithrombotic medications  Ongoing aggressive stroke risk factor management  Therapy recommendations: No PT follow-up recommended. OT evaluation is pending.  Disposition: Pending  Hypertension  Blood pressure tends to run high  Permissive hypertension (OK if < 220/120) but gradually normalize in 5-7 days  Long-term BP goal normotensive  Hyperlipidemia  Home meds: No lipid lowering medications prior to admission  LDL 112, goal < 70  Now on Lipitor 20 mg  daily  Continue statin at discharge   Other Stroke Risk Factors  Advanced age  Former cigarette smoker - quit  Obesity, Body mass index is 29.99 kg/m., recommend weight loss, diet and exercise as appropriate   Hx stroke/TIA  Family hx stroke (mother, sister, and brother)  Migraines    Other Active Problems  Enlarged pulmonary hila bilaterally -> f/u recommended (see above)  Hospital day # 1  Personally examined patient and images, performed neuro exam,assessment and plan as stated above.  I have personally obtained the history, evaluated lab date, reviewed imaging studies and agree with radiology interpretations.   stroke neuro will sign off with recommendation to discharge on Plavix and asa 81mg  with outpatient neuro follow up.    To contact Stroke Continuity provider, please refer to [email or web address] After hours, contact General Neurology

## 2016-11-08 NOTE — Progress Notes (Signed)
Patient confused at times - needs to be reoriented   Patient refusing to bed alarm - RN educated patient on safety concern while in this hospital.  Patient advised that she doesn't care and that she is leaving today like the doctor said she could. RN educated patient that it is 0430 in the morning.    RN will continue to monitor

## 2016-11-08 NOTE — Care Management Obs Status (Signed)
[facility] NOTIFICATION   Patient Details  Name: Maria [name] MRN: [id number] Date of Birth: 1922/02/20   Medicare Observation Status Notification Given:  Yes    Dawayne Patricia, RN 11/08/2016, 1:20 PM

## 2016-11-09 LAB — VAS US CAROTID
LCCADDIAS: -14 cm/s
LCCADSYS: -70 cm/s
LCCAPDIAS: 13 cm/s
LCCAPSYS: 62 cm/s
LEFT ECA DIAS: -8 cm/s
LEFT VERTEBRAL DIAS: -13 cm/s
LICADDIAS: -13 cm/s
LICADSYS: -54 cm/s
LICAPSYS: -61 cm/s
Left ICA prox dias: -16 cm/s
RCCAPSYS: 71 cm/s
RIGHT ECA DIAS: -10 cm/s
RIGHT VERTEBRAL DIAS: -10 cm/s
Right CCA prox dias: 10 cm/s
Right cca dist sys: -57 cm/s

## 2016-11-09 LAB — ANTINUCLEAR ANTIBODIES, IFA: ANA Ab, IFA: NEGATIVE

## 2016-11-09 LAB — FOLATE RBC
FOLATE, RBC: 1680 ng/mL (ref >498)
Folate, Hemolysate: 614.7 ng/mL
HEMATOCRIT: 36.6 % (ref 34.0–46.6)

## 2016-11-14 ENCOUNTER — Emergency Department (HOSPITAL_COMMUNITY)
Admission: EM | Admit: 2016-11-14 | Discharge: 2016-11-14 | Disposition: A | Discharge status: Home / Self Care | Payer: Medicare Other | Attending: Emergency Medicine | Admitting: Emergency Medicine

## 2016-11-14 ENCOUNTER — Encounter (HOSPITAL_COMMUNITY): Payer: Self-pay

## 2016-11-14 DIAGNOSIS — Z96643 Presence of artificial hip joint, bilateral: Secondary | ICD-10-CM | POA: Insufficient documentation

## 2016-11-14 DIAGNOSIS — I158 Other secondary hypertension: Secondary | ICD-10-CM | POA: Diagnosis not present

## 2016-11-14 DIAGNOSIS — Z7982 Long term (current) use of aspirin: Secondary | ICD-10-CM | POA: Diagnosis not present

## 2016-11-14 DIAGNOSIS — Z87891 Personal history of nicotine dependence: Secondary | ICD-10-CM | POA: Diagnosis not present

## 2016-11-14 DIAGNOSIS — Z79899 Other long term (current) drug therapy: Secondary | ICD-10-CM | POA: Diagnosis not present

## 2016-11-14 DIAGNOSIS — I10 Essential (primary) hypertension: Secondary | ICD-10-CM | POA: Diagnosis present

## 2016-11-14 MED ORDER — AMLODIPINE BESYLATE 5 MG PO TABS
5.0000 mg | ORAL_TABLET | Freq: Once | ORAL | Status: AC
  Administered 2016-11-14: 5 mg via ORAL
  Filled 2016-11-14: qty 1

## 2016-11-14 NOTE — ED Triage Notes (Signed)
BIB [facility] from home. As of yestarday pt was taken off of lisinopril 40mg  and was put on losartan 100mg . Pt was told to monitor bp. When pt took BP and it was 217/115 and when ems took BP it 200/95. Hx of TIA and HTN.

## 2016-11-14 NOTE — ED Notes (Signed)
Pt denise cp or sob at this time.

## 2016-11-14 NOTE — ED Notes (Signed)
PT states understanding of care given, follow up care, and medication prescribed. PT ambulated from ED to car with a steady gait. 

## 2016-11-14 NOTE — ED Provider Notes (Signed)
[facility] DEPT Provider Note   CSN: [id number] Arrival date & time: 11/14/16  1521     History   Chief Complaint Chief Complaint  Patient presents with  . Hypertension    HPI Maria [name] is a 83 y.o. female.  HPI 83 year old female with a history of hypertension, hyperlipidemia, TIAs presents to the emergency department with hypertension. She reports that she was seen by her primary care doctor's office earlier this week and switched from lisinopril to losartan for intolerance to the lisinopril due to persistent cough and rashes. Patient reports that her pressure was elevated this morning, causing her to present to the emergency department. She endorses a mild headache this morning but is currently asymptomatic. She denies any chest pain, shortness of breath, visual disturbance, focal weakness. She denies any recent fevers or infections.   Past Medical History:  Diagnosis Date  . Allergic urticaria   . Arthritis    OA AND PAIN LEFT HIP  . Glaucoma   . Headache(784.0)    HX OF MIGRAINES  . Hypercholesterolemia   . Hypertension   . Osteoporosis   . Peripheral neuropathy   . PONV (postoperative nausea and vomiting)   . Rib fractures    IN THE PAST  . Shingles 1940'S    Patient Active Problem List   Diagnosis Date Noted  . Hypertensive urgency   . TIA (transient ischemic attack) 03/12/2015  . Dependent edema 03/12/2015  . Dysphagia   . Hyperlipidemia 11/16/2014  . Essential hypertension 11/16/2014  . Postop Acute blood loss anemia 06/01/2011  . Osteoarthritis of hip 05/27/2011    Past Surgical History:  Procedure Laterality Date  . ABDOMINAL HYSTERECTOMY  1958  . APPENDECTOMY  1937  . DILATION AND CURETTAGE OF UTERUS     MULTIPLE  . EXTERNAL FIXATOR LEFT ARM FRACTURE 1997    . EYE SURGERY  1998 & 2006   BILATERAL CATARACT EXTRACTION  . JOINT REPLACEMENT  2011   RIGHT TOTAL HIP REPLACEMENT  . TONSILLECTOMY  1934  . TOTAL HIP ARTHROPLASTY   05/27/2011   Procedure: TOTAL HIP ARTHROPLASTY;  Surgeon: Gearlean Alf, MD;  Location: [facility] ORS;  Service: Orthopedics;  Laterality: Left;    OB History    No data available       Home Medications    Prior to Admission medications   Medication Sig Start Date End Date Taking? Authorizing Provider  amLODipine (NORVASC) 2.5 MG tablet Take 1 tablet (2.5 mg total) by mouth daily. 11/08/16   Rai, Vernelle Emerald, MD  aspirin EC 81 MG EC tablet Take 1 tablet (81 mg total) by mouth daily. 11/08/16   Rai, Ripudeep K, MD  atorvastatin (LIPITOR) 20 MG tablet Take 1 tablet (20 mg total) by mouth at bedtime. 11/07/16   Rai, Vernelle Emerald, MD  Cholecalciferol (VITAMIN D3) 2000 UNITS TABS Take 2,000 Units by mouth daily.    [provider]  clopidogrel (PLAVIX) 75 MG tablet TAKE ONE TABLET BY MOUTH ONCE DAILY 09/24/16   Narda Amber K, DO  Cyanocobalamin (VITAMIN B-12 PO) Take 1 tablet by mouth daily. Gummy    [provider]  lisinopril (PRINIVIL,ZESTRIL) 40 MG tablet Take 1 tablet (40 mg total) by mouth daily. 11/07/16   Rai, Vernelle Emerald, MD  metoprolol succinate (TOPROL-XL) 25 MG 24 hr tablet Take 1 tablet (25 mg total) by mouth daily with breakfast. 03/14/15   Ghimire, Henreitta Leber, MD  Multiple Vitamins-Minerals (PRESERVISION AREDS PO) Take 1 tablet by mouth 2 (  two) times daily.    [provider]  triamcinolone cream (KENALOG) 0.5 % Apply 1 application topically 2 (two) times daily as needed (rash).  02/19/16   [provider]    Family History Family History  Problem Relation Age of Onset  . Heart disease Mother   . Stroke Mother   . Other Father 48       MVA  . Stroke Sister   . Stroke Brother   . Atrial fibrillation Son   . Other Son 65       fire    Social History Social History  Substance Use Topics  . Smoking status: Former Research scientist (life sciences)  . Smokeless tobacco: Never Used     Comment: 56 YRS AGO  . Alcohol use No     Allergies   Codeine; Tylenol  [acetaminophen]; and Ibuprofen   Review of Systems Review of Systems All other systems are reviewed and are negative for acute change except as noted in the HPI   Physical Exam Updated Vital Signs BP (!) 183/65   Pulse (!) 56   Temp 98.2 F (36.8 C) (Oral)   Resp 18   Ht 5\' 4"  (1.626 m)   Wt 77.1 kg (170 lb)   SpO2 99%   BMI 29.18 kg/m   Physical Exam  Constitutional: She is oriented to person, place, and time. She appears well-developed and well-nourished. No distress.  HENT:  Head: Normocephalic and atraumatic.  Nose: Nose normal.  Eyes: Pupils are equal, round, and reactive to light. Conjunctivae and EOM are normal. Right eye exhibits no discharge. Left eye exhibits no discharge. No scleral icterus.  Neck: Normal range of motion. Neck supple.  Cardiovascular: Normal rate and regular rhythm.  Exam reveals no gallop and no friction rub.   No murmur heard. Pulmonary/Chest: Effort normal and breath sounds normal. No stridor. No respiratory distress. She has no rales.  Abdominal: Soft. She exhibits no distension. There is no tenderness.  Musculoskeletal: She exhibits no edema or tenderness.  Neurological: She is alert and oriented to person, place, and time.  Skin: Skin is warm and dry. No rash noted. She is not diaphoretic. No erythema.  Psychiatric: She has a normal mood and affect.  Vitals reviewed.    ED Treatments / Results  Labs (all labs ordered are listed, but only abnormal results are displayed) Labs Reviewed - No data to display  EKG  EKG Interpretation None       Radiology No results found.  Procedures Procedures (including critical care time)  Medications Ordered in ED Medications  amLODipine (NORVASC) tablet 5 mg (5 mg Oral Given 11/14/16 1736)     Initial Impression / Assessment and Plan / ED Course  I have reviewed the triage vital signs and the nursing notes.  Pertinent labs & imaging results that were available during my care of the  patient were reviewed by me and considered in my medical decision making (see chart for details).     Asymptomatic hypertension. Patient did have systolic pressures in the 200s. Apparently has not been taking her amlodipine, which is on her med list. Given a dose here. Blood pressure improved. EKG without acute ischemic changes.  No need for labs at this time. Patient was monitored for several hours and remained asymptomatic.  Recommended close follow-up with PCP to ensure patient is aware of her appropriate medications.  The patient is safe for discharge with strict return precautions.   Final Clinical Impressions(s) / ED Diagnoses  Final diagnoses:  Other secondary hypertension   Disposition: Discharge  Condition: Good  I have discussed the results, Dx and Tx plan with the patient who expressed understanding and agree(s) with the plan. Discharge instructions discussed at great length. The patient was given strict return precautions who verbalized understanding of the instructions. No further questions at time of discharge.    New Prescriptions   No medications on file    Follow Up: Jani Gravel, [place] [address] [phone]  Schedule an appointment as soon as possible for a visit in 2 days To go over your home medications for blood pressure.      Fatima Blank, MD 11/14/16 1750

## 2016-11-18 NOTE — Progress Notes (Signed)
PT Progress Note Addendum for G-Codes    2016/12/03 1505  PT G-Codes **NOT FOR INPATIENT CLASS**  Functional Assessment Tool Used AM-PAC 6 Clicks Basic Mobility;Clinical judgement  Functional Limitation Mobility: Walking and moving around  Mobility: Walking and Moving Around Current Status [phone]) CJ  Mobility: Walking and Moving Around Goal Status [phone]) [place], [place], DPT [phone]

## 2016-11-23 ENCOUNTER — Encounter (HOSPITAL_COMMUNITY): Payer: Self-pay | Admitting: Emergency Medicine

## 2016-11-23 ENCOUNTER — Emergency Department (HOSPITAL_COMMUNITY): Payer: Medicare Other

## 2016-11-23 ENCOUNTER — Emergency Department (HOSPITAL_COMMUNITY)
Admission: EM | Admit: 2016-11-23 | Discharge: 2016-11-23 | Disposition: A | Discharge status: Home / Self Care | Payer: Medicare Other | Attending: Emergency Medicine | Admitting: Emergency Medicine

## 2016-11-23 DIAGNOSIS — Z7982 Long term (current) use of aspirin: Secondary | ICD-10-CM | POA: Insufficient documentation

## 2016-11-23 DIAGNOSIS — I10 Essential (primary) hypertension: Secondary | ICD-10-CM | POA: Diagnosis not present

## 2016-11-23 DIAGNOSIS — Z7902 Long term (current) use of antithrombotics/antiplatelets: Secondary | ICD-10-CM | POA: Diagnosis not present

## 2016-11-23 DIAGNOSIS — G459 Transient cerebral ischemic attack, unspecified: Secondary | ICD-10-CM | POA: Diagnosis not present

## 2016-11-23 DIAGNOSIS — Z96643 Presence of artificial hip joint, bilateral: Secondary | ICD-10-CM | POA: Diagnosis not present

## 2016-11-23 DIAGNOSIS — R202 Paresthesia of skin: Secondary | ICD-10-CM | POA: Diagnosis present

## 2016-11-23 DIAGNOSIS — Z87891 Personal history of nicotine dependence: Secondary | ICD-10-CM | POA: Insufficient documentation

## 2016-11-23 DIAGNOSIS — Z79899 Other long term (current) drug therapy: Secondary | ICD-10-CM | POA: Insufficient documentation

## 2016-11-23 LAB — DIFFERENTIAL
BASOS PCT: 1 %
Basophils Absolute: 0 10*3/uL (ref 0.0–0.1)
EOS PCT: 5 %
Eosinophils Absolute: 0.2 10*3/uL (ref 0.0–0.7)
LYMPHS PCT: 18 %
Lymphs Abs: 0.8 10*3/uL (ref 0.7–4.0)
Monocytes Absolute: 0.4 10*3/uL (ref 0.1–1.0)
Monocytes Relative: 9 %
NEUTROS ABS: 3.2 10*3/uL (ref 1.7–7.7)
NEUTROS PCT: 67 %

## 2016-11-23 LAB — I-STAT CHEM 8, ED
BUN: 31 mg/dL — ABNORMAL HIGH (ref 6–20)
Calcium, Ion: 1.14 mmol/L — ABNORMAL LOW (ref 1.15–1.40)
Chloride: 99 mmol/L — ABNORMAL LOW (ref 101–111)
Creatinine, Ser: 1 mg/dL (ref 0.44–1.00)
GLUCOSE: 101 mg/dL — AB (ref 65–99)
HEMATOCRIT: 38 % (ref 36.0–46.0)
HEMOGLOBIN: 12.9 g/dL (ref 12.0–15.0)
POTASSIUM: 3.8 mmol/L (ref 3.5–5.1)
Sodium: 141 mmol/L (ref 135–145)
TCO2: 32 mmol/L (ref 22–32)

## 2016-11-23 LAB — PROTIME-INR
INR: 1.02
PROTHROMBIN TIME: 13.4 s (ref 11.4–15.2)

## 2016-11-23 LAB — COMPREHENSIVE METABOLIC PANEL
ALBUMIN: 3.9 g/dL (ref 3.5–5.0)
ALK PHOS: 83 U/L (ref 38–126)
ALT: 21 U/L (ref 14–54)
ANION GAP: 7 (ref 5–15)
AST: 26 U/L (ref 15–41)
BUN: 25 mg/dL — ABNORMAL HIGH (ref 6–20)
CHLORIDE: 101 mmol/L (ref 101–111)
CO2: 30 mmol/L (ref 22–32)
CREATININE: 0.94 mg/dL (ref 0.44–1.00)
Calcium: 9 mg/dL (ref 8.9–10.3)
GFR calc Af Amer: 59 mL/min — ABNORMAL LOW (ref >60)
GFR calc non Af Amer: 51 mL/min — ABNORMAL LOW (ref >60)
GLUCOSE: 107 mg/dL — AB (ref 65–99)
Potassium: 3.8 mmol/L (ref 3.5–5.1)
SODIUM: 138 mmol/L (ref 135–145)
Total Bilirubin: 0.6 mg/dL (ref 0.3–1.2)
Total Protein: 6.3 g/dL — ABNORMAL LOW (ref 6.5–8.1)

## 2016-11-23 LAB — I-STAT TROPONIN, ED: Troponin i, poc: 0.01 ng/mL (ref 0.00–0.08)

## 2016-11-23 LAB — CBC
HCT: 39 % (ref 36.0–46.0)
Hemoglobin: 12.6 g/dL (ref 12.0–15.0)
MCH: 29.8 pg (ref 26.0–34.0)
MCHC: 32.3 g/dL (ref 30.0–36.0)
MCV: 92.2 fL (ref 78.0–100.0)
PLATELETS: 196 10*3/uL (ref 150–400)
RBC: 4.23 MIL/uL (ref 3.87–5.11)
RDW: 13.9 % (ref 11.5–15.5)
WBC: 4.6 10*3/uL (ref 4.0–10.5)

## 2016-11-23 LAB — APTT: aPTT: 30 seconds (ref 24–36)

## 2016-11-23 NOTE — ED Provider Notes (Signed)
He has 27 old scar: [facility] Provider Note   CSN: [id number] Arrival date & time: 11/23/16  1226     History   Chief Complaint Chief Complaint  Patient presents with  . Tingling  . Headache    HPI Maria [name] is a 83 y.o. female.  83yo f w/ PMH including HTN, HLD, TIA who p/w L arm tingling. At 11:15am today, pt had sudden onset of L arm tingling/numbness associated w/ L mouth/lower face numbness. Episode lasted 30 min then resolved and has not returned. She has had these symptoms multiple times previously, was admitted at the end of September for a TIA work up which was reassuring. No vision changes, extremity weakness, or balance problems. Husband endorses some slurred speech during the time. She reports mild headache, no severe headache. No fevers, vomiting, chest pain, shortness of breath, or recent illness. She is compliant with meds.   The history is provided by the patient.  Headache      Past Medical History:  Diagnosis Date  . Allergic urticaria   . Arthritis    OA AND PAIN LEFT HIP  . Glaucoma   . Headache(784.0)    HX OF MIGRAINES  . Hypercholesterolemia   . Hypertension   . Osteoporosis   . Peripheral neuropathy   . PONV (postoperative nausea and vomiting)   . Rib fractures    IN THE PAST  . Shingles 1940'S    Patient Active Problem List   Diagnosis Date Noted  . Hypertensive urgency   . TIA (transient ischemic attack) 03/12/2015  . Dependent edema 03/12/2015  . Dysphagia   . Hyperlipidemia 11/16/2014  . Essential hypertension 11/16/2014  . Postop Acute blood loss anemia 06/01/2011  . Osteoarthritis of hip 05/27/2011    Past Surgical History:  Procedure Laterality Date  . ABDOMINAL HYSTERECTOMY  1958  . APPENDECTOMY  1937  . DILATION AND CURETTAGE OF UTERUS     MULTIPLE  . EXTERNAL FIXATOR LEFT ARM FRACTURE 1997    . EYE SURGERY  1998 & 2006   BILATERAL CATARACT EXTRACTION  . JOINT  REPLACEMENT  2011   RIGHT TOTAL HIP REPLACEMENT  . TONSILLECTOMY  1934  . TOTAL HIP ARTHROPLASTY  05/27/2011   Procedure: TOTAL HIP ARTHROPLASTY;  Surgeon: Gearlean Alf, MD;  Location: [facility];  Service: Orthopedics;  Laterality: Left;    OB History    No data available       Home Medications    Prior to Admission medications   Medication Sig Start Date End Date Taking? Authorizing Provider  amLODipine (NORVASC) 2.5 MG tablet Take 1 tablet (2.5 mg total) by mouth daily. 11/08/16  Yes Rai, Ripudeep K, MD  aspirin EC 81 MG EC tablet Take 1 tablet (81 mg total) by mouth daily. 11/08/16  Yes Rai, Ripudeep K, MD  Cholecalciferol (VITAMIN D3) 2000 UNITS TABS Take 2,000 Units by mouth daily.   Yes [provider]  clopidogrel (PLAVIX) 75 MG tablet TAKE ONE TABLET BY MOUTH ONCE DAILY 09/24/16  Yes Patel, Donika K, DO  Cyanocobalamin (VITAMIN B-12 IJ) Inject 1,000 mcg as directed every 30 (thirty) days.   Yes [provider]  losartan (COZAAR) 100 MG tablet Take 100 mg by mouth daily. 11/12/16  Yes [provider]  metoprolol succinate (TOPROL-XL) 25 MG 24 hr tablet Take 1 tablet (25 mg total) by mouth daily with breakfast. 03/14/15  Yes Ghimire, Henreitta Leber, MD  Multiple Vitamins-Minerals (PRESERVISION AREDS  PO) Take 1 tablet by mouth 2 (two) times daily.   Yes [provider]  triamcinolone cream (KENALOG) 0.5 % Apply 1 application topically 2 (two) times daily as needed (rash).  02/19/16  Yes [provider]  atorvastatin (LIPITOR) 20 MG tablet Take 1 tablet (20 mg total) by mouth at bedtime. Patient not taking: Reported on 11/23/2016 11/07/16   Rai, Vernelle Emerald, MD  lisinopril (PRINIVIL,ZESTRIL) 40 MG tablet Take 1 tablet (40 mg total) by mouth daily. Patient not taking: Reported on 11/23/2016 11/07/16   Mendel Corning, MD    Family History Family History  Problem Relation Age of Onset  . Heart disease Mother   . Stroke Mother   . Other Father 104        MVA  . Stroke Sister   . Stroke Brother   . Atrial fibrillation Son   . Other Son 64       fire    Social History Social History  Substance Use Topics  . Smoking status: Former Research scientist (life sciences)  . Smokeless tobacco: Never Used     Comment: 19 YRS AGO  . Alcohol use No     Allergies   Codeine; Tylenol [acetaminophen]; and Ibuprofen   Review of Systems Review of Systems  Neurological: Positive for headaches.   All other systems reviewed and are negative except that which was mentioned in HPI  Physical Exam Updated Vital Signs BP (!) 173/59   Pulse (!) 52   Resp (!) 22   Ht 5\' 4"  (1.626 m)   Wt 74.8 kg (165 lb)   SpO2 95%   BMI 28.32 kg/m   Physical Exam  Constitutional: She is oriented to person, place, and time. She appears well-developed and well-nourished. No distress.  Awake, alert  HENT:  Head: Normocephalic and atraumatic.  Eyes: Pupils are equal, round, and reactive to light. Conjunctivae and EOM are normal.  Neck: Neck supple.  Cardiovascular: Normal rate, regular rhythm and normal heart sounds.   No murmur heard. Pulmonary/Chest: Effort normal and breath sounds normal. No respiratory distress.  Abdominal: Soft. Bowel sounds are normal. She exhibits no distension. There is no tenderness.  Musculoskeletal: She exhibits no edema.  Neurological: She is alert and oriented to person, place, and time. She has normal reflexes. No cranial nerve deficit. She exhibits normal muscle tone.  Fluent speech, normal finger-to-nose testing, negative pronator drift, no clonus NOTE: patient did have confusion when trying to complete finger to nose testing and heel to shin testing, difficulty processing my commands 5/5 strength and normal sensation x all 4 extremities  Skin: Skin is warm and dry.  Scattered old ecchymoses b/l forearms  Psychiatric: She has a normal mood and affect. Judgment and thought content normal.  Nursing note and vitals reviewed.    ED Treatments /  Results  Labs (all labs ordered are listed, but only abnormal results are displayed) Labs Reviewed  COMPREHENSIVE METABOLIC PANEL - Abnormal; Notable for the following:       Result Value   Glucose, Bld 107 (*)    BUN 25 (*)    Total Protein 6.3 (*)    GFR calc non Af Amer 51 (*)    GFR calc Af Amer 59 (*)    All other components within normal limits  I-STAT CHEM 8, ED - Abnormal; Notable for the following:    Chloride 99 (*)    BUN 31 (*)    Glucose, Bld 101 (*)    Calcium, Ion  1.14 (*)    All other components within normal limits  PROTIME-INR  APTT  CBC  DIFFERENTIAL  I-STAT TROPONIN, ED  CBG MONITORING, ED    EKG  EKG Interpretation  Date/Time:  Monday November 23 2016 13:42:43 EDT Ventricular Rate:  62 PR Interval:    QRS Duration: 108 QT Interval:  486 QTC Calculation: 435 R Axis:   -8 Text Interpretation:  Sinus rhythm Supraventricular bigeminy bigeminy new from previous Confirmed by Theotis Burrow [phone]) on 11/23/2016 2:40:37 PM       Radiology Ct Head Wo Contrast  Result Date: 11/23/2016 CLINICAL DATA:  83 year old female with left arm and face numbness onset today. Initial encounter. EXAM: CT HEAD WITHOUT CONTRAST TECHNIQUE: Contiguous axial images were obtained from the base of the skull through the vertex without intravenous contrast. COMPARISON:  11/07/2016 head CT.  11/07/2016 brain MR. FINDINGS: Brain: No intracranial hemorrhage or CT evidence of large acute infarct. Chronic microvascular changes. Global atrophy. No intracranial mass lesion noted on this unenhanced exam. Vascular: Vascular calcifications. Skull: No acute abnormality. Sinuses/Orbits: No acute orbital abnormality. Visualized paranasal sinuses clear. Other: Mastoid air cells and middle ear cavities clear. IMPRESSION: No intracranial hemorrhage or CT evidence of large acute infarct. Chronic microvascular changes. Atrophy. Electronically Signed   By: Genia Del M.D.   On: 11/23/2016 14:18     Procedures Procedures (including critical care time)  Medications Ordered in ED Medications - No data to display   Initial Impression / Assessment and Plan / ED Course  I have reviewed the triage vital signs and the nursing notes.  Pertinent labs & imaging results that were available during my care of the patient were reviewed by me and considered in my medical decision making (see chart for details).     Pt w/ h/o frequent TIAs, recent TIA w/u p/w another episode. Symptoms resolved at presentand patient well-appearing with reassuring vital signs. She had some difficulty following commands occasionally but demonstrated a normal neurologic exam. Her labs and head CT are negative acute.  Discussed with neurology, Dr. Rory Percy, who recommended MRI to evaluate for acute stroke. He has advised that since patient had TIA workup within the last 30 days, if her MRI is negative acute she will not require further workup today and can follow as an outpatient with her neurologist. I have discussed this plan with oncoming ED provider and dispo is pending MRI results. Final Clinical Impressions(s) / ED Diagnoses   Final diagnoses:  None    New Prescriptions New Prescriptions   No medications on file     Orma Cheetham, Wenda Overland, MD 11/23/16 1609

## 2016-11-23 NOTE — ED Triage Notes (Signed)
Per [facility]: Pt from home with left arm tingling that began around 11:15 today.  On scene: pt's tingling resolved, no unilateral weakness noted, and complaints of mild intermittent headache.  Pt states she has a hx of TIA's that normally begins with left arm tingling.  PTA vitals: Bp 144/74, HR 58, CBG 130.

## 2016-11-23 NOTE — Progress Notes (Signed)
OT Evaluation - G-Code Late Entry    11/07/16 1600  OT Time Calculation  OT Start Time (ACUTE ONLY) 1350  OT Stop Time (ACUTE ONLY) 1411  OT Time Calculation (min) 21 min  OT G-codes **NOT FOR INPATIENT CLASS**  Functional Assessment Tool Used Clinical judgement  Functional Limitation Self care  Self Care Current Status (Y6063) CI  Self Care Goal Status (K1601) [facility]  Self Care Discharge Status (U9323) [place], OTR/L Acute Rehab Pager: [phone] Office: [phone]

## 2016-11-23 NOTE — ED Provider Notes (Signed)
Turned over at 1523.  Plan is to get an MRI of the brain.  If negative can dc home and follow up with neurology  MRI result: IMPRESSION:  1. No acute intracranial abnormality.  2. 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  3. 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      Electronically Signed  By: Ulyses Jarred M.D.  On: 11/23/2016 19:26   No acute stroke noted.  Findings discussed with family and patient. Pt is stable for outpatient follow up.   Dorie Rank, MD 11/24/16 [phone]

## 2016-11-23 NOTE — ED Notes (Signed)
ED Provider at bedside. 

## 2016-11-23 NOTE — Discharge Instructions (Signed)
The MRI did not show an acute stroke. Follow up with your primary care doctor and the neurologist for further evaluation

## 2016-11-23 NOTE — ED Notes (Signed)
Walked patient to the bathroom patient did well patient is back in the bed with family at bedside and call bell in reach

## 2016-11-23 NOTE — ED Notes (Signed)
Patient transported to MRI 

## 2016-11-24 ENCOUNTER — Telehealth: Payer: Self-pay | Admitting: Neurology

## 2016-11-24 NOTE — Telephone Encounter (Signed)
Ok to schedule on 11/5.

## 2016-11-24 NOTE — Telephone Encounter (Signed)
Pt's granddaughter Maria [name] called and said pt got out of the hospital and needs an appointment with Dr Posey Pronto but Dr Posey Pronto has nothing until February, doctor at hospital recommends an appointment with Dr Posey Pronto, please advise CB# [phone]

## 2016-11-24 NOTE — Telephone Encounter (Signed)
Please advise 

## 2016-11-25 ENCOUNTER — Observation Stay (HOSPITAL_COMMUNITY)
Admission: EM | Admit: 2016-11-25 | Discharge: 2016-11-26 | Disposition: A | Discharge status: Home / Self Care | Payer: Medicare Other | Attending: Internal Medicine | Admitting: Internal Medicine

## 2016-11-25 ENCOUNTER — Other Ambulatory Visit: Payer: Self-pay | Admitting: Internal Medicine

## 2016-11-25 ENCOUNTER — Emergency Department (HOSPITAL_COMMUNITY): Payer: Medicare Other

## 2016-11-25 ENCOUNTER — Encounter (HOSPITAL_COMMUNITY): Payer: Self-pay | Admitting: Emergency Medicine

## 2016-11-25 DIAGNOSIS — Z87891 Personal history of nicotine dependence: Secondary | ICD-10-CM | POA: Diagnosis not present

## 2016-11-25 DIAGNOSIS — G459 Transient cerebral ischemic attack, unspecified: Secondary | ICD-10-CM | POA: Diagnosis not present

## 2016-11-25 DIAGNOSIS — R590 Localized enlarged lymph nodes: Secondary | ICD-10-CM

## 2016-11-25 DIAGNOSIS — Z96641 Presence of right artificial hip joint: Secondary | ICD-10-CM | POA: Insufficient documentation

## 2016-11-25 DIAGNOSIS — R200 Anesthesia of skin: Secondary | ICD-10-CM | POA: Diagnosis present

## 2016-11-25 DIAGNOSIS — N179 Acute kidney failure, unspecified: Secondary | ICD-10-CM | POA: Diagnosis not present

## 2016-11-25 DIAGNOSIS — Z79899 Other long term (current) drug therapy: Secondary | ICD-10-CM | POA: Diagnosis not present

## 2016-11-25 DIAGNOSIS — Z7982 Long term (current) use of aspirin: Secondary | ICD-10-CM | POA: Diagnosis not present

## 2016-11-25 DIAGNOSIS — Z7902 Long term (current) use of antithrombotics/antiplatelets: Secondary | ICD-10-CM | POA: Insufficient documentation

## 2016-11-25 DIAGNOSIS — I10 Essential (primary) hypertension: Secondary | ICD-10-CM | POA: Insufficient documentation

## 2016-11-25 DIAGNOSIS — Z96642 Presence of left artificial hip joint: Secondary | ICD-10-CM | POA: Diagnosis not present

## 2016-11-25 LAB — URINALYSIS, ROUTINE W REFLEX MICROSCOPIC
Bilirubin Urine: NEGATIVE
GLUCOSE, UA: NEGATIVE mg/dL
HGB URINE DIPSTICK: NEGATIVE
KETONES UR: NEGATIVE mg/dL
NITRITE: NEGATIVE
PH: 5 (ref 5.0–8.0)
PROTEIN: NEGATIVE mg/dL
Specific Gravity, Urine: 1.009 (ref 1.005–1.030)

## 2016-11-25 LAB — DIFFERENTIAL
BASOS PCT: 1 %
Basophils Absolute: 0 10*3/uL (ref 0.0–0.1)
EOS ABS: 0.3 10*3/uL (ref 0.0–0.7)
EOS PCT: 6 %
Lymphocytes Relative: 23 %
Lymphs Abs: 1.1 10*3/uL (ref 0.7–4.0)
MONO ABS: 0.5 10*3/uL (ref 0.1–1.0)
MONOS PCT: 11 %
NEUTROS ABS: 2.9 10*3/uL (ref 1.7–7.7)
Neutrophils Relative %: 59 %

## 2016-11-25 LAB — COMPREHENSIVE METABOLIC PANEL
ALT: 18 U/L (ref 14–54)
ANION GAP: 10 (ref 5–15)
AST: 24 U/L (ref 15–41)
Albumin: 3.9 g/dL (ref 3.5–5.0)
Alkaline Phosphatase: 84 U/L (ref 38–126)
BUN: 39 mg/dL — ABNORMAL HIGH (ref 6–20)
CHLORIDE: 97 mmol/L — AB (ref 101–111)
CO2: 26 mmol/L (ref 22–32)
Calcium: 8.7 mg/dL — ABNORMAL LOW (ref 8.9–10.3)
Creatinine, Ser: 1.56 mg/dL — ABNORMAL HIGH (ref 0.44–1.00)
GFR, EST AFRICAN AMERICAN: 32 mL/min — AB (ref >60)
GFR, EST NON AFRICAN AMERICAN: 28 mL/min — AB (ref >60)
Glucose, Bld: 108 mg/dL — ABNORMAL HIGH (ref 65–99)
POTASSIUM: 3.5 mmol/L (ref 3.5–5.1)
Sodium: 133 mmol/L — ABNORMAL LOW (ref 135–145)
Total Bilirubin: 0.3 mg/dL (ref 0.3–1.2)
Total Protein: 6.2 g/dL — ABNORMAL LOW (ref 6.5–8.1)

## 2016-11-25 LAB — CBC
HCT: 37.6 % (ref 36.0–46.0)
Hemoglobin: 12.1 g/dL (ref 12.0–15.0)
MCH: 29.7 pg (ref 26.0–34.0)
MCHC: 32.2 g/dL (ref 30.0–36.0)
MCV: 92.4 fL (ref 78.0–100.0)
PLATELETS: 194 10*3/uL (ref 150–400)
RBC: 4.07 MIL/uL (ref 3.87–5.11)
RDW: 13.9 % (ref 11.5–15.5)
WBC: 4.9 10*3/uL (ref 4.0–10.5)

## 2016-11-25 LAB — APTT: aPTT: 31 seconds (ref 24–36)

## 2016-11-25 LAB — PROTIME-INR
INR: 1.03
PROTHROMBIN TIME: 13.5 s (ref 11.4–15.2)

## 2016-11-25 LAB — I-STAT CHEM 8, ED
BUN: 37 mg/dL — ABNORMAL HIGH (ref 6–20)
CHLORIDE: 98 mmol/L — AB (ref 101–111)
Calcium, Ion: 1.06 mmol/L — ABNORMAL LOW (ref 1.15–1.40)
Creatinine, Ser: 1.6 mg/dL — ABNORMAL HIGH (ref 0.44–1.00)
Glucose, Bld: 105 mg/dL — ABNORMAL HIGH (ref 65–99)
HCT: 37 % (ref 36.0–46.0)
HEMOGLOBIN: 12.6 g/dL (ref 12.0–15.0)
POTASSIUM: 3.7 mmol/L (ref 3.5–5.1)
SODIUM: 138 mmol/L (ref 135–145)
TCO2: 27 mmol/L (ref 22–32)

## 2016-11-25 LAB — I-STAT TROPONIN, ED: TROPONIN I, POC: 0.01 ng/mL (ref 0.00–0.08)

## 2016-11-25 MED ORDER — STROKE: EARLY STAGES OF RECOVERY BOOK
Freq: Once | Status: AC
  Administered 2016-11-26: 02:00:00
  Filled 2016-11-25: qty 1

## 2016-11-25 MED ORDER — AMLODIPINE BESYLATE 2.5 MG PO TABS
2.5000 mg | ORAL_TABLET | Freq: Every day | ORAL | Status: DC
  Administered 2016-11-26: 2.5 mg via ORAL
  Filled 2016-11-25: qty 1

## 2016-11-25 MED ORDER — SODIUM CHLORIDE 0.9 % IV SOLN
INTRAVENOUS | Status: DC
  Administered 2016-11-26: 01:00:00 via INTRAVENOUS

## 2016-11-25 MED ORDER — METOPROLOL SUCCINATE ER 25 MG PO TB24
25.0000 mg | ORAL_TABLET | Freq: Every day | ORAL | Status: DC
  Administered 2016-11-26: 25 mg via ORAL
  Filled 2016-11-25: qty 1

## 2016-11-25 MED ORDER — ENOXAPARIN SODIUM 30 MG/0.3ML ~~LOC~~ SOLN
30.0000 mg | Freq: Every day | SUBCUTANEOUS | Status: DC
  Administered 2016-11-26: 30 mg via SUBCUTANEOUS
  Filled 2016-11-25: qty 0.3

## 2016-11-25 MED ORDER — ATORVASTATIN CALCIUM 10 MG PO TABS
20.0000 mg | ORAL_TABLET | Freq: Every day | ORAL | Status: DC
  Filled 2016-11-25: qty 1

## 2016-11-25 MED ORDER — CLOPIDOGREL BISULFATE 75 MG PO TABS
75.0000 mg | ORAL_TABLET | Freq: Every day | ORAL | Status: DC
  Administered 2016-11-26: 75 mg via ORAL
  Filled 2016-11-25: qty 1

## 2016-11-25 MED ORDER — SODIUM CHLORIDE 0.9 % IV BOLUS (SEPSIS): 250.0000 mL | Freq: Once | INTRAVENOUS | Status: DC

## 2016-11-25 NOTE — H&P (Signed)
History and Physical    Maria [name] TOI:[id number] DOB: 13-Aug-1922 DOA: 11/25/2016  PCP: Jani Gravel, MD  Patient coming from: home.  Chief Complaint: left facial and left upper extremity numbness.  HPI: Maria [name] is a 83 y.o. female with history of hypertension admitted 2 weeks ago for TIA has been experiencing left facial and left upper extremity numbness off and on. Patient had come to the ER 2 days ago with numbness of the face and left upper extremity and after MRI was done which did not show anything acute patient was discharged home. This evening after supper around 5-6 p.m. patient started experiencing left facial numbness and left upper extremity numbness which lasted for around 20 minutes and resolved. Denies any weakness of the extremities or difficulty talking or swallowing or any visual symptoms.   ED Course: in the ER patient had the recurrence of similar symptoms again which lasted for 20 minutes. Patient had extensive workup done last admission 2 weeks ago for TIA at that time which carotid Doppler and 2-D echo and were unremarkable. On call neurologist Dr. Cheral Marker was consulted and neurologist  Requested observation and get repeat MRI. Labs were also revealing acute renal failure.  Review of Systems: As per HPI, rest all negative.   Past Medical History:  Diagnosis Date  . Allergic urticaria   . Arthritis    OA AND PAIN LEFT HIP  . Glaucoma   . Headache(784.0)    HX OF MIGRAINES  . Hypercholesterolemia   . Hypertension   . Osteoporosis   . Peripheral neuropathy   . PONV (postoperative nausea and vomiting)   . Rib fractures    IN THE PAST  . Shingles 1940'S    Past Surgical History:  Procedure Laterality Date  . ABDOMINAL HYSTERECTOMY  1958  . APPENDECTOMY  1937  . DILATION AND CURETTAGE OF UTERUS     MULTIPLE  . EXTERNAL FIXATOR LEFT ARM FRACTURE 1997    . EYE SURGERY  1998 & 2006   BILATERAL CATARACT EXTRACTION  . JOINT REPLACEMENT   2011   RIGHT TOTAL HIP REPLACEMENT  . TONSILLECTOMY  1934  . TOTAL HIP ARTHROPLASTY  05/27/2011   Procedure: TOTAL HIP ARTHROPLASTY;  Surgeon: Gearlean Alf, MD;  Location: [facility];  Service: Orthopedics;  Laterality: Left;     reports that she has quit smoking. She has never used smokeless tobacco. She reports that she does not drink alcohol or use drugs.  Allergies  Allergen Reactions  . Codeine Nausea And Vomiting  . Tylenol [Acetaminophen]     ITCHING  . Ibuprofen Itching and Rash    Family History  Problem Relation Age of Onset  . Heart disease Mother   . Stroke Mother   . Other Father 28       MVA  . Stroke Sister   . Stroke Brother   . Atrial fibrillation Son   . Other Son 12       fire    Prior to Admission medications   Medication Sig Start Date End Date Taking? Authorizing Provider  amLODipine (NORVASC) 2.5 MG tablet Take 1 tablet (2.5 mg total) by mouth daily. 11/08/16  Yes Rai, Ripudeep K, MD  aspirin EC 81 MG EC tablet Take 1 tablet (81 mg total) by mouth daily. 11/08/16  Yes Rai, Ripudeep K, MD  atorvastatin (LIPITOR) 20 MG tablet Take 1 tablet (20 mg total) by mouth at bedtime. 11/07/16  Yes Rai, Vernelle Emerald, MD  Cholecalciferol (VITAMIN D3) 2000 UNITS TABS Take 2,000 Units by mouth daily.   Yes [provider]  clopidogrel (PLAVIX) 75 MG tablet TAKE ONE TABLET BY MOUTH ONCE DAILY 09/24/16  Yes Patel, Donika K, DO  Cyanocobalamin (VITAMIN B-12 IJ) Inject 1,000 mcg as directed every 30 (thirty) days.   Yes [provider]  losartan (COZAAR) 100 MG tablet Take 100 mg by mouth daily. 11/12/16  Yes [provider]  metoprolol succinate (TOPROL-XL) 25 MG 24 hr tablet Take 1 tablet (25 mg total) by mouth daily with breakfast. 03/14/15  Yes Ghimire, Henreitta Leber, MD  Multiple Vitamins-Minerals (PRESERVISION AREDS PO) Take 1 tablet by mouth 2 (two) times daily.   Yes [provider]  triamcinolone cream (KENALOG) 0.5 % Apply 1 application  topically 2 (two) times daily as needed (rash).  02/19/16  Yes [provider]  lisinopril (PRINIVIL,ZESTRIL) 40 MG tablet Take 1 tablet (40 mg total) by mouth daily. Patient not taking: Reported on 11/25/2016 11/07/16   Mendel Corning, MD    Physical Exam: Vitals:   11/25/16 1955 11/25/16 2200 11/25/16 2215 11/25/16 2230  BP: 132/64 134/65 (!) 133/47 (!) 132/47  Pulse: 63 (!) 38 65 (!) 59  Resp: 18 (!) 22 16 17   Temp: 97.9 F (36.6 C)     TempSrc: Oral     SpO2: 91% 97% 97% 95%      Constitutional: moderately built and nourished. Vitals:   11/25/16 1955 11/25/16 2200 11/25/16 2215 11/25/16 2230  BP: 132/64 134/65 (!) 133/47 (!) 132/47  Pulse: 63 (!) 38 65 (!) 59  Resp: 18 (!) 22 16 17   Temp: 97.9 F (36.6 C)     TempSrc: Oral     SpO2: 91% 97% 97% 95%   Eyes: anicteric no pallor. ENMT: no discharge from the ears eyes nose or mouth. Neck: no mass felt. No neck rigidity. No JVD appreciated. Respiratory: no rhonchi or crepitations. Cardiovascular: S1-S2 heard no murmurs appreciated. Abdomen: soft nontender bowel sounds present. Musculoskeletal: no edema. No joint effusion. Skin: no rash. Skin appears warm. Neurologic:alert awake oriented to time place and person. Moves all extremities 5 x 5. No facial asymmetry. Tongue is midline. Psychiatric: appears normal. Normal affect.   Labs on Admission: I have personally reviewed following labs and imaging studies  CBC:  Recent Labs Lab 11/23/16 1335 11/23/16 1353 11/25/16 2028 11/25/16 2029  WBC 4.6  --  4.9  --   NEUTROABS 3.2  --  2.9  --   HGB 12.6 12.9 12.1 12.6  HCT 39.0 38.0 37.6 37.0  MCV 92.2  --  92.4  --   PLT 196  --  194  --    Basic Metabolic Panel:  Recent Labs Lab 11/23/16 1335 11/23/16 1353 11/25/16 2028 11/25/16 2029  NA 138 141 133* 138  K 3.8 3.8 3.5 3.7  CL 101 99* 97* 98*  CO2 30  --  26  --   GLUCOSE 107* 101* 108* 105*  BUN 25* 31* 39* 37*  CREATININE 0.94 1.00 1.56* 1.60*    CALCIUM 9.0  --  8.7*  --    GFR: Estimated Creatinine Clearance: 22.2 mL/min (A) (by C-G formula based on SCr of 1.6 mg/dL (H)). Liver Function Tests:  Recent Labs Lab 11/23/16 1335 11/25/16 2028  AST 26 24  ALT 21 18  ALKPHOS 83 84  BILITOT 0.6 0.3  PROT 6.3* 6.2*  ALBUMIN 3.9 3.9   No results for input(s): LIPASE, AMYLASE in  the last 168 hours. No results for input(s): AMMONIA in the last 168 hours. Coagulation Profile:  Recent Labs Lab 11/23/16 1335 11/25/16 2028  INR 1.02 1.03   Cardiac Enzymes: No results for input(s): CKTOTAL, CKMB, CKMBINDEX, TROPONINI in the last 168 hours. BNP (last 3 results) No results for input(s): PROBNP in the last 8760 hours. HbA1C: No results for input(s): HGBA1C in the last 72 hours. CBG: No results for input(s): GLUCAP in the last 168 hours. Lipid Profile: No results for input(s): CHOL, HDL, LDLCALC, TRIG, CHOLHDL, LDLDIRECT in the last 72 hours. Thyroid Function Tests: No results for input(s): TSH, T4TOTAL, FREET4, T3FREE, THYROIDAB in the last 72 hours. Anemia Panel: No results for input(s): VITAMINB12, FOLATE, FERRITIN, TIBC, IRON, RETICCTPCT in the last 72 hours. Urine analysis:    Component Value Date/Time   COLORURINE YELLOW 11/25/2016 2144   APPEARANCEUR CLEAR 11/25/2016 2144   LABSPEC 1.009 11/25/2016 2144   PHURINE 5.0 11/25/2016 2144   GLUCOSEU NEGATIVE 11/25/2016 2144   [place] NEGATIVE 11/25/2016 2144   [place] NEGATIVE 11/25/2016 2144   [place] NEGATIVE 11/25/2016 2144   PROTEINUR NEGATIVE 11/25/2016 2144   UROBILINOGEN 0.2 05/14/2011 1554   NITRITE NEGATIVE 11/25/2016 2144   LEUKOCYTESUR LARGE (A) 11/25/2016 2144   Sepsis Labs: @LABRCNTIP (procalcitonin:4,lacticidven:4) )No results found for this or any previous visit (from the past 240 hour(s)).   Radiological Exams on Admission: Ct Head Wo Contrast  Result Date: 11/25/2016 CLINICAL DATA:  TIA symptoms with left face and arm tingling EXAM: CT HEAD  WITHOUT CONTRAST TECHNIQUE: Contiguous axial images were obtained from the base of the skull through the vertex without intravenous contrast. COMPARISON:  11/23/2016 FINDINGS: Brain: No evidence of acute infarction, hemorrhage, hydrocephalus, extra-axial collection or mass lesion/mass effect. Atrophic changes are noted stable from the prior exam. Vascular: No hyperdense vessel or unexpected calcification. Skull: Normal. Negative for fracture or focal lesion. Sinuses/Orbits: No acute finding. Other: None. IMPRESSION: No acute intracranial abnormality is noted. Chronic atrophic changes are noted. Electronically Signed   By: Inez Catalina M.D.   On: 11/25/2016 20:45    EKG: Independently reviewed. Normal sinus rhythm.  Assessment/Plan Principal Problem:   TIA (transient ischemic attack) Active Problems:   Essential hypertension   ARF (acute renal failure) (HCC)    1. TIA - MRI of the brain has been ordered. Patient has passed swallow. Patient is on aspirin and Plavix and statins. I have discussed with Dr. Cheral Marker on-call neurologist to advise no further workup if MRI brain is negative. Patient has had extensive workup including hemoglobin A1c lipid panel carotid Doppler and 2-D echo 2 weeks ago. 2. Acute renal failure - will hold off patient's ARB/ACE inhibitor due to renal failure. Gently hydrate and recheck metabolic panel. 3. Hypertension - holding off ARB due to renal failure. Continue Toprol and amlodipine. Closely monitor blood pressure trends. 4. Hilar enlargement per the chest x-ray done last admission - further workup as outpatient.  I have reviewed patient's old charts and labs. I have discussed with on-call neurologist.   DVT prophylaxis: Lovenox. Code Status: full code.  Family Communication: patient's husband.  Disposition Plan: home.  Consults called: discussed with neurologist.  Admission status: observation.    Rise Patience MD [facility] Pager [phone].  If 7PM-7AM, please contact night-coverage [email or web address] Password [email or web address]  11/25/2016, 11:12 PM

## 2016-11-25 NOTE — ED Triage Notes (Signed)
Pt presents from home with [facility] for stroke like symptoms that resolved before arrival to ER; pt reported L face and arm tingling; pt has hx of TIA and CVA in the past with last hospital visit Monday; VSS; pt alert and oriented at this time

## 2016-11-25 NOTE — ED Provider Notes (Signed)
[facility] PROGRESSIVE CARE Provider Note   CSN: [id number] Arrival date & time: 11/25/16  1924     History   Chief Complaint Chief Complaint  Patient presents with  . Stroke Like symptoms    HPI Maria [name] is a 83 y.o. female.  83 year old female with recurrent TIA episodes, HTN, HLD who presents with acute neurological deficits concerning for TIA.  Onset of left hand and forearm numbness with left facial numbness and dysarthria around 5 PM.  Symptoms lasted 15 minutes before resolving.  Patient has had multiple recent ED evaluations for similar TIA episodes.  She had a MRI 2 days ago for similar symptoms which showed no acute intra-cranial abnormality.  Currently on plavix and ASA. Patient denies urinary symptoms.   The history is provided by the patient, a relative and medical records. No language interpreter was used.    Past Medical History:  Diagnosis Date  . Allergic urticaria   . Arthritis    OA AND PAIN LEFT HIP  . Glaucoma   . Headache(784.0)    HX OF MIGRAINES  . Hypercholesterolemia   . Hypertension   . Osteoporosis   . Peripheral neuropathy   . PONV (postoperative nausea and vomiting)   . Rib fractures    IN THE PAST  . Shingles 1940'S    Patient Active Problem List   Diagnosis Date Noted  . ARF (acute renal failure) ([place]) 11/25/2016  . Hypertensive urgency   . TIA (transient ischemic attack) 03/12/2015  . Dependent edema 03/12/2015  . Dysphagia   . Hyperlipidemia 11/16/2014  . Essential hypertension 11/16/2014  . Postop Acute blood loss anemia 06/01/2011  . Osteoarthritis of hip 05/27/2011    Past Surgical History:  Procedure Laterality Date  . ABDOMINAL HYSTERECTOMY  1958  . APPENDECTOMY  1937  . DILATION AND CURETTAGE OF UTERUS     MULTIPLE  . EXTERNAL FIXATOR LEFT ARM FRACTURE 1997    . EYE SURGERY  1998 & 2006   BILATERAL CATARACT EXTRACTION  . JOINT REPLACEMENT  2011   RIGHT TOTAL HIP REPLACEMENT  . TONSILLECTOMY   1934  . TOTAL HIP ARTHROPLASTY  05/27/2011   Procedure: TOTAL HIP ARTHROPLASTY;  Surgeon: Gearlean Alf, MD;  Location: [facility];  Service: Orthopedics;  Laterality: Left;    OB History    No data available       Home Medications    Prior to Admission medications   Medication Sig Start Date End Date Taking? Authorizing Provider  amLODipine (NORVASC) 2.5 MG tablet Take 1 tablet (2.5 mg total) by mouth daily. 11/08/16  Yes Rai, Ripudeep K, MD  aspirin EC 81 MG EC tablet Take 1 tablet (81 mg total) by mouth daily. 11/08/16  Yes Rai, Ripudeep K, MD  atorvastatin (LIPITOR) 20 MG tablet Take 1 tablet (20 mg total) by mouth at bedtime. 11/07/16  Yes Rai, Ripudeep K, MD  Cholecalciferol (VITAMIN D3) 2000 UNITS TABS Take 2,000 Units by mouth daily.   Yes [provider]  clopidogrel (PLAVIX) 75 MG tablet TAKE ONE TABLET BY MOUTH ONCE DAILY 09/24/16  Yes Patel, Donika K, DO  Cyanocobalamin (VITAMIN B-12 IJ) Inject 1,000 mcg as directed every 30 (thirty) days.   Yes [provider]  losartan (COZAAR) 100 MG tablet Take 100 mg by mouth daily. 11/12/16  Yes [provider]  metoprolol succinate (TOPROL-XL) 25 MG 24 hr tablet Take 1 tablet (25 mg total) by mouth daily with breakfast. 03/14/15  Yes Ghimire,  Henreitta Leber, MD  Multiple Vitamins-Minerals (PRESERVISION AREDS PO) Take 1 tablet by mouth 2 (two) times daily.   Yes [provider]  triamcinolone cream (KENALOG) 0.5 % Apply 1 application topically 2 (two) times daily as needed (rash).  02/19/16  Yes [provider]  lisinopril (PRINIVIL,ZESTRIL) 40 MG tablet Take 1 tablet (40 mg total) by mouth daily. Patient not taking: Reported on 11/25/2016 11/07/16   Mendel Corning, MD    Family History Family History  Problem Relation Age of Onset  . Heart disease Mother   . Stroke Mother   . Other Father 61       MVA  . Stroke Sister   . Stroke Brother   . Atrial fibrillation Son   . Other Son 42       fire     Social History Social History  Substance Use Topics  . Smoking status: Former Research scientist (life sciences)  . Smokeless tobacco: Never Used     Comment: 69 YRS AGO  . Alcohol use No     Allergies   Codeine; Tylenol [acetaminophen]; and Ibuprofen   Review of Systems Review of Systems  Constitutional: Negative for chills and fever.  HENT: Negative for ear pain and sore throat.   Eyes: Negative for pain and visual disturbance.  Respiratory: Negative for cough and shortness of breath.   Cardiovascular: Negative for chest pain and palpitations.  Gastrointestinal: Negative for abdominal pain and vomiting.  Genitourinary: Negative for dysuria and hematuria.  Musculoskeletal: Negative for arthralgias and back pain.  Skin: Negative for color change and rash.  Neurological: Positive for speech difficulty and numbness. Negative for seizures and syncope.  All other systems reviewed and are negative.    Physical Exam Updated Vital Signs BP 128/65 (BP Location: Left Arm)   Pulse (!) 123   Temp 97.9 F (36.6 C) (Oral)   Resp 20   Ht 5\' 4"  (1.626 m)   Wt 77.1 kg (170 lb)   SpO2 94%   BMI 29.18 kg/m   Physical Exam  Constitutional: She appears well-developed.  HENT:  Head: Normocephalic and atraumatic.  Eyes: Conjunctivae are normal.  Neck: Neck supple.  Cardiovascular: Normal rate and regular rhythm.   No murmur heard. Pulmonary/Chest: Effort normal and breath sounds normal. No respiratory distress.  Abdominal: Soft. There is no tenderness.  Musculoskeletal: She exhibits no edema.  Neurological: She is alert. No cranial nerve deficit. Coordination normal.  No slurred speech or pronator drift. No facial asymmetry or numbness. 5/5 motor strength and intact sensation in all extremities. Finger-to-nose intact bilaterally  Skin: Skin is warm and dry.  Nursing note and vitals reviewed.    ED Treatments / Results  Labs (all labs ordered are listed, but only abnormal results are  displayed) Labs Reviewed  COMPREHENSIVE METABOLIC PANEL - Abnormal; Notable for the following:       Result Value   Sodium 133 (*)    Chloride 97 (*)    Glucose, Bld 108 (*)    BUN 39 (*)    Creatinine, Ser 1.56 (*)    Calcium 8.7 (*)    Total Protein 6.2 (*)    GFR calc non Af Amer 28 (*)    GFR calc Af Amer 32 (*)    All other components within normal limits  URINALYSIS, ROUTINE W REFLEX MICROSCOPIC - Abnormal; Notable for the following:    Leukocytes, UA LARGE (*)    Bacteria, UA RARE (*)    Squamous Epithelial / LPF  0-5 (*)    All other components within normal limits  I-STAT CHEM 8, ED - Abnormal; Notable for the following:    Chloride 98 (*)    BUN 37 (*)    Creatinine, Ser 1.60 (*)    Glucose, Bld 105 (*)    Calcium, Ion 1.06 (*)    All other components within normal limits  URINE CULTURE  PROTIME-INR  APTT  CBC  DIFFERENTIAL  SODIUM, URINE, RANDOM  CREATININE, URINE, RANDOM  COMPREHENSIVE METABOLIC PANEL  CBC  I-STAT TROPONIN, ED    EKG  EKG Interpretation  Date/Time:  Wednesday November 25 2016 20:03:09 EDT Ventricular Rate:  62 PR Interval:    QRS Duration: 98 QT Interval:  440 QTC Calculation: 446 R Axis:   28 Text Interpretation:  Sinus rhythm with Premature atrial complexes Otherwise normal ECG When comapred to prior, no significant changes seen.  No STEMI Confirmed by Antony Blackbird [phone]) on 11/25/2016 10:19:28 PM       Radiology Ct Head Wo Contrast  Result Date: 11/25/2016 CLINICAL DATA:  TIA symptoms with left face and arm tingling EXAM: CT HEAD WITHOUT CONTRAST TECHNIQUE: Contiguous axial images were obtained from the base of the skull through the vertex without intravenous contrast. COMPARISON:  11/23/2016 FINDINGS: Brain: No evidence of acute infarction, hemorrhage, hydrocephalus, extra-axial collection or mass lesion/mass effect. Atrophic changes are noted stable from the prior exam. Vascular: No hyperdense vessel or unexpected  calcification. Skull: Normal. Negative for fracture or focal lesion. Sinuses/Orbits: No acute finding. Other: None. IMPRESSION: No acute intracranial abnormality is noted. Chronic atrophic changes are noted. Electronically Signed   By: Inez Catalina M.D.   On: 11/25/2016 20:45    Procedures Procedures (including critical care time)  Medications Ordered in ED Medications  sodium chloride 0.9 % bolus 250 mL (not administered)  amLODipine (NORVASC) tablet 2.5 mg (not administered)  atorvastatin (LIPITOR) tablet 20 mg (20 mg Oral Not Given 11/26/16 0120)  clopidogrel (PLAVIX) tablet 75 mg (not administered)  metoprolol succinate (TOPROL-XL) 24 hr tablet 25 mg (not administered)  0.9 %  sodium chloride infusion ( Intravenous New Bag/Given 11/26/16 0129)  enoxaparin (LOVENOX) injection 30 mg (not administered)   stroke: mapping our early stages of recovery book ( Does not apply Given 11/26/16 0133)     Initial Impression / Assessment and Plan / ED Course  I have reviewed the triage vital signs and the nursing notes.  Pertinent labs & imaging results that were available during my care of the patient were reviewed by me and considered in my medical decision making (see chart for details).     65 yoF h/o recurrent TIA episodes, HTN, HLD who p/w resolved focal neuro sx concerning for TIA. Had dysarthria, L facial and LUE numbness. No focal neuro deficits on initial exam. AF, VSS.  Labwork significant for AKI with Crt 1.6 (baseline 0.9-1). Patient with return of L facial numbness around 2245. Speech appears slurred. She is a poor candidate for tPA.   Spoke with Neurology, who recommends admission with telemetry to examine for underlying A. Fib. Pt given IVF and admitted to medicine.  Pt care d/w Dr. Sherry Ruffing  Final Clinical Impressions(s) / ED Diagnoses   Final diagnoses:  TIA (transient ischemic attack)    New Prescriptions Current Discharge Medication List       Payton Emerald,  MD 11/26/16 1219    Tegeler, Gwenyth Allegra, MD 11/27/16 2320

## 2016-11-26 ENCOUNTER — Encounter (HOSPITAL_COMMUNITY): Payer: Self-pay

## 2016-11-26 ENCOUNTER — Observation Stay (HOSPITAL_COMMUNITY): Payer: Medicare Other

## 2016-11-26 DIAGNOSIS — N179 Acute kidney failure, unspecified: Secondary | ICD-10-CM

## 2016-11-26 DIAGNOSIS — I10 Essential (primary) hypertension: Secondary | ICD-10-CM

## 2016-11-26 DIAGNOSIS — G459 Transient cerebral ischemic attack, unspecified: Secondary | ICD-10-CM

## 2016-11-26 LAB — CBC
HCT: 38.1 % (ref 36.0–46.0)
HEMOGLOBIN: 12.1 g/dL (ref 12.0–15.0)
MCH: 29.3 pg (ref 26.0–34.0)
MCHC: 31.8 g/dL (ref 30.0–36.0)
MCV: 92.3 fL (ref 78.0–100.0)
Platelets: 187 10*3/uL (ref 150–400)
RBC: 4.13 MIL/uL (ref 3.87–5.11)
RDW: 14 % (ref 11.5–15.5)
WBC: 3.7 10*3/uL — ABNORMAL LOW (ref 4.0–10.5)

## 2016-11-26 LAB — COMPREHENSIVE METABOLIC PANEL
ALBUMIN: 3.6 g/dL (ref 3.5–5.0)
ALT: 19 U/L (ref 14–54)
AST: 23 U/L (ref 15–41)
Alkaline Phosphatase: 83 U/L (ref 38–126)
Anion gap: 9 (ref 5–15)
BUN: 31 mg/dL — ABNORMAL HIGH (ref 6–20)
CHLORIDE: 103 mmol/L (ref 101–111)
CO2: 28 mmol/L (ref 22–32)
CREATININE: 1.05 mg/dL — AB (ref 0.44–1.00)
Calcium: 9 mg/dL (ref 8.9–10.3)
GFR calc non Af Amer: 45 mL/min — ABNORMAL LOW (ref >60)
GFR, EST AFRICAN AMERICAN: 52 mL/min — AB (ref >60)
Glucose, Bld: 111 mg/dL — ABNORMAL HIGH (ref 65–99)
Potassium: 3.6 mmol/L (ref 3.5–5.1)
SODIUM: 140 mmol/L (ref 135–145)
Total Bilirubin: 0.5 mg/dL (ref 0.3–1.2)
Total Protein: 6.1 g/dL — ABNORMAL LOW (ref 6.5–8.1)

## 2016-11-26 LAB — SODIUM, URINE, RANDOM: SODIUM UR: 38 mmol/L

## 2016-11-26 LAB — CREATININE, URINE, RANDOM: CREATININE, URINE: 55.91 mg/dL

## 2016-11-26 MED ORDER — ASPIRIN EC 81 MG PO TBEC
81.0000 mg | DELAYED_RELEASE_TABLET | Freq: Every day | ORAL | Status: DC
  Administered 2016-11-26: 81 mg via ORAL
  Filled 2016-11-26: qty 1

## 2016-11-26 NOTE — ED Notes (Signed)
Text paged Dr. Raliegh Ip about pt diet need after passing swallow screen

## 2016-11-26 NOTE — Progress Notes (Signed)
PT Cancellation Note  Patient Details Name: Maria [name] MRN: [id number] DOB: 1922-08-05   Cancelled Treatment:    Reason Eval/Treat Not Completed: Patient at procedure or test/unavailable. Pt off of the floor for an MRI. PT will continue to f/u with pt as available.    [place] 11/26/2016, 7:52 AM

## 2016-11-26 NOTE — Evaluation (Signed)
Physical Therapy Evaluation & Discharge Patient Details Name: Maria [name] MRN: [id number] DOB: 03/02/1922 Today's Date: 11/26/2016   History of Present Illness  Pt is a 83 y/o female admitted secondary to L face and L UE parasthesia. MRI negative for any acute findings. PMH including but not limited to HTN and glaucoma.  Clinical Impression  Pt presented supine in bed with HOB elevated, awake and willing to participate in therapy session. Prior to admission, pt reported that she ambulates with use of SPC PRN. Pt's husband present throughout session as well. Pt ambulated in hallway without use of an AD, close min guard for safety. Pt with no overt LOB or need for physical assistance. Pt reported that she has been getting HHPT, recommend continuing with those services upon d/c. No further acute PT needs identified at this time. PT signing off.    Follow Up Recommendations Home health PT;Other (comment) (continue with current HHPT)    Equipment Recommendations  None recommended by PT    Recommendations for Other Services       Precautions / Restrictions Precautions Precautions: Fall Restrictions Weight Bearing Restrictions: No      Mobility  Bed Mobility Overal bed mobility: Modified Independent                Transfers Overall transfer level: Needs assistance Equipment used: None Transfers: Sit to/from Stand Sit to Stand: Supervision         General transfer comment: supervision for safety  Ambulation/Gait Ambulation/Gait assistance: Min guard Ambulation Distance (Feet): 200 Feet Assistive device: None Gait Pattern/deviations: Step-through pattern;Decreased step length - right;Decreased step length - left;Decreased stride length;Drifts right/left;Trunk flexed Gait velocity: decreased   General Gait Details: mild instability but no overt LOB or need for physical assistance, close min guard for safety  Stairs            Wheelchair Mobility     Modified Rankin (Stroke Patients Only)       Balance Overall balance assessment: Needs assistance Sitting-balance support: Feet supported Sitting balance-Leahy Scale: Good Sitting balance - Comments: pt able to sit EOB with supervision   Standing balance support: During functional activity;No upper extremity supported Standing balance-Leahy Scale: Fair                               Pertinent Vitals/Pain Pain Assessment: No/denies pain    Home Living Family/patient expects to be discharged to:: Private residence Living Arrangements: Spouse/significant other Available Help at Discharge: Family;Available 24 hours/day Type of Home: House Home Access: Stairs to enter Entrance Stairs-Rails: Right;Left Entrance Stairs-Number of Steps: 4 Home Layout: One level Home Equipment: Cane - single point;Grab bars - tub/shower      Prior Function Level of Independence: Independent with assistive device(s)         Comments: ADLs, IADLs, and driving. pt reports that she and her husband go to the [employer] every day to walk     Hand Dominance        Extremity/Trunk Assessment   Upper Extremity Assessment Upper Extremity Assessment: Overall WFL for tasks assessed    Lower Extremity Assessment Lower Extremity Assessment: Generalized weakness       Communication   Communication: No difficulties  Cognition Arousal/Alertness: Awake/alert Behavior During Therapy: WFL for tasks assessed/performed Overall Cognitive Status: Within Functional Limits for tasks assessed                                          General Comments      Exercises     Assessment/Plan    PT Assessment All further PT needs can be met in the next venue of care  PT Problem List Decreased balance;Decreased coordination;Decreased mobility;Decreased safety awareness       PT Treatment Interventions      PT Goals (Current goals can be found in the Care Plan section)  Acute  Rehab PT Goals Patient Stated Goal: return home and to PLOF    Frequency     Barriers to discharge        Co-evaluation               AM-PAC PT "6 Clicks" Daily Activity  Outcome Measure Difficulty turning over in bed (including adjusting bedclothes, sheets and blankets)?: None Difficulty moving from lying on back to sitting on the side of the bed? : None Difficulty sitting down on and standing up from a chair with arms (e.g., wheelchair, bedside commode, etc,.)?: A Little Help needed moving to and from a bed to chair (including a wheelchair)?: None Help needed walking in hospital room?: A Little Help needed climbing 3-5 steps with a railing? : A Little 6 Click Score: 21    End of Session Equipment Utilized During Treatment: Gait belt Activity Tolerance: Patient tolerated treatment well Patient left: in bed;with call bell/phone within reach;with family/visitor present Nurse Communication: Mobility status PT Visit Diagnosis: Unsteadiness on feet (R26.81);Other abnormalities of gait and mobility (R26.89)    Time: 7673-4193 PT Time Calculation (min) (ACUTE ONLY): 16 min   Charges:   PT Evaluation $PT Eval Moderate Complexity: 1 Mod     PT G Codes:   PT G-Codes **NOT FOR INPATIENT CLASS** Functional Assessment Tool Used: AM-PAC 6 Clicks Basic Mobility;Clinical judgement Functional Limitation: Mobility: Walking and moving around Mobility: Walking and Moving Around Current Status (X9024): At least 20 percent but less than 40 percent impaired, limited or restricted Mobility: Walking and Moving Around Goal Status [phone]): At least 1 percent but less than 20 percent impaired, limited or restricted Mobility: Walking and Moving Around Discharge Status [phone]): At least 20 percent but less than 40 percent impaired, limited or restricted    [facility], [place], DPT [place] 11/26/2016, 12:43 PM

## 2016-11-26 NOTE — Progress Notes (Signed)
Patient is alert and verbal, pt is been discharge home, discharge summary and follow up appointments have been reviewed. IV and tele has been discontinued. patient denies any further questions or concerns. Family is here to transport home.

## 2016-11-26 NOTE — Discharge Summary (Signed)
Physician Discharge Summary  Maria [name] ZWC:[id number] DOB: 02-04-1923 DOA: 11/25/2016  PCP: Jani Gravel, MD  Admit date: 11/25/2016 Discharge date: 11/26/2016  Time spent: 45 minutes  Recommendations for Outpatient Follow-up:  Patient will be discharged to home with home health physical therapy.  Patient will need to follow up with primary care provider within one week of discharge.  Follow up with neurology on 12/14/2016 with Dr. Posey Pronto.  Patient should continue medications as prescribed.  Patient should follow a heart healthy diet.   Discharge Diagnoses:  TIA AKI Essential hypertension Hilar enlargement  Discharge Condition: Stable  Diet recommendation: Heart healthy  Filed Weights   11/26/16 0057  Weight: 77.1 kg (170 lb)    History of present illness:  On 11/25/2016 by Dr. Gean Birchwood Maria [name] is a 83 y.o. female with history of hypertension admitted 2 weeks ago for TIA has been experiencing left facial and left upper extremity numbness off and on. Patient had come to the ER 2 days ago with numbness of the face and left upper extremity and after MRI was done which did not show anything acute patient was discharged home. This evening after supper around 5-6 p.m. patient started experiencing left facial numbness and left upper extremity numbness which lasted for around 20 minutes and resolved. Denies any weakness of the extremities or difficulty talking or swallowing or any visual symptoms.   Hospital Course:  TIA -presented with left facial, left upper extremity numbness which has been intermittent -Symptoms have now resolved -CT head showed no acute intracranial abnormality -MRI brain showed no acute intracranial abnormality -Was recently admitted approximately 2 weeks ago, had full TIA/CVA workup. Echocardiogram 10/12/2016 showed EF of 65-70%. Hemoglobin A1c was 5.8, LDL 112. Carotid doppler showed 1-39% ICA stenosis, vertebral artery flow  antegrade.  -Of note, patient has had 3 MRIs since September 2018, which have been unremarkable for acute CVA. -Patient does have an outpatient neurological follow-up with Dr. Posey Pronto on 12/14/2016. -She recently started atorvastatin on 11/25/2016. -Continue aspirin, Plavix, statin -Discussed with neurology-Dr. Rory Percy, no further workup or recommendations at this time. -PT evaluated patient and recommended HH  Acute kidney injury -Presented with a creatinine of 1.56-1.60 -Possibly due to medications -Patient was placed on gentle IV fluids -creatinine currently 1.05 -Repeat BMP in 1 week  Essential hypertension -Continue amlodipine, metoprolol  -Losartan held due to AKI. May restart upon discharge, would follow creatinine closely.   Hilar enlargement  -found on CXR during last admission, will need further workup as an outpatient  Procedures: None  Consultations: Neurology, via phone  Discharge Exam: Vitals:   11/26/16 0934 11/26/16 1356  BP: (!) 159/59 (!) 148/55  Pulse: 62 (!) 59  Resp:  19  Temp:  98.1 F (36.7 C)  SpO2: 96% 97%   Patient feeling better today. Denies chest pain, shortness of breath, abdominal pain, N?V/D/C, headache, dizziness, further numbness.    General: Well developed, well nourished, NAD, appears stated age  17: NCAT,  mucous membranes moist.  Cardiovascular: S1 S2 auscultated, RRR, no murmur  Respiratory: Clear to auscultation bilaterally with equal chest rise  Abdomen: Soft, nontender, nondistended, + bowel sounds  Extremities: warm dry without cyanosis clubbing or edema  Neuro: AAOx3, nonfocal  Psych: Normal affect and demeanor    Discharge Instructions Discharge Instructions    Discharge instructions    Complete by:  As directed    Patient will be discharged to home.  Patient will need to follow up with primary care  provider within one week of discharge.  Follow up with neurology on 12/14/2016 with Dr. Posey Pronto.  Patient should  continue medications as prescribed.  Patient should follow a heart healthy diet.     Current Discharge Medication List    CONTINUE these medications which have NOT CHANGED   Details  amLODipine (NORVASC) 2.5 MG tablet Take 1 tablet (2.5 mg total) by mouth daily. Qty: 30 tablet, Refills: 3    aspirin EC 81 MG EC tablet Take 1 tablet (81 mg total) by mouth daily. Qty: 30 tablet, Refills: 4    atorvastatin (LIPITOR) 20 MG tablet Take 1 tablet (20 mg total) by mouth at bedtime. Qty: 30 tablet, Refills: 4    Cholecalciferol (VITAMIN D3) 2000 UNITS TABS Take 2,000 Units by mouth daily.    clopidogrel (PLAVIX) 75 MG tablet TAKE ONE TABLET BY MOUTH ONCE DAILY Qty: 30 tablet, Refills: 11    Cyanocobalamin (VITAMIN B-12 IJ) Inject 1,000 mcg as directed every 30 (thirty) days.    losartan (COZAAR) 100 MG tablet Take 100 mg by mouth daily.    metoprolol succinate (TOPROL-XL) 25 MG 24 hr tablet Take 1 tablet (25 mg total) by mouth daily with breakfast. Qty: 30 tablet, Refills: 0    Multiple Vitamins-Minerals (PRESERVISION AREDS PO) Take 1 tablet by mouth 2 (two) times daily.    triamcinolone cream (KENALOG) 0.5 % Apply 1 application topically 2 (two) times daily as needed (rash).       STOP taking these medications     lisinopril (PRINIVIL,ZESTRIL) 40 MG tablet        Allergies  Allergen Reactions  . Codeine Nausea And Vomiting  . Tylenol [Acetaminophen]     ITCHING  . Ibuprofen Itching and Rash   Follow-up Information    Jani Gravel, MD. Schedule an appointment as soon as possible for a visit in 1 week(s).   Specialty:  Internal Medicine Why:  Hospital follow up Contact information: [address] [place] [address] [place], [place], [place]. Go on 12/14/2016.   Specialty:  Neurology Contact information: [place] [id number] [phone]            The results of significant diagnostics from this  hospitalization (including imaging, microbiology, ancillary and laboratory) are listed below for reference.    Significant Diagnostic Studies: Dg Chest 2 View  Result Date: 11/07/2016 CLINICAL DATA:  Patient seemed a little confused, but was able to answer questions. She said she had a mini stroke early last night. No SOB, no pain in chest. Numbness and tingling in left arm and hand. Slight cough. EXAM: CHEST  2 VIEW COMPARISON:  11/06/2016 FINDINGS: Heart is mildly enlarged. Aorta is mildly calcified. No pulmonary edema. There is question of basilar infiltrate based on the lateral view. The hilar prominence noted on the most recent exam is significantly less apparent, likely due to better lung inflation. IMPRESSION: 1. Stable cardiomegaly. 2. Better lung inflation, improving the appearance of hilar prominence. No evidence for hilar adenopathy. 3. Possible lower lobe infiltrate. Continued follow-up is recommended. When the patient is able, follow-up PA and lateral chest x-ray may be helpful. Electronically Signed   By: Nolon Nations M.D.   On: 11/07/2016 11:14   Dg Chest 2 View  Result Date: 11/06/2016 CLINICAL DATA:  Shortness of breath EXAM: CHEST  2 VIEW COMPARISON:  05/14/2011 FINDINGS: Low lung volumes. Mild diffuse coarse interstitial opacity, increased compared  to prior could relate to chronic change although superimposed acute edema cannot be excluded. Borderline to mild cardiomegaly with linear atelectasis at the left base. No pleural effusion. Enlarged pulmonary hila bilaterally. IMPRESSION: 1. Very low lung volumes 2. Borderline cardiomegaly. Mild diffuse interstitial opacity may reflect combination of chronic change and superimposed mild edema 3. Enlarged pulmonary hila bilaterally, possibly due to enlarged pulmonary vessels as may be seen with pulmonary hypertension, although adenopathy is also a consideration. Short interval radiographic follow-up recommended; alternatively CT could be  obtained to further assess Electronically Signed   By: Donavan Foil M.D.   On: 11/06/2016 22:05   Ct Head Wo Contrast  Result Date: 11/25/2016 CLINICAL DATA:  TIA symptoms with left face and arm tingling EXAM: CT HEAD WITHOUT CONTRAST TECHNIQUE: Contiguous axial images were obtained from the base of the skull through the vertex without intravenous contrast. COMPARISON:  11/23/2016 FINDINGS: Brain: No evidence of acute infarction, hemorrhage, hydrocephalus, extra-axial collection or mass lesion/mass effect. Atrophic changes are noted stable from the prior exam. Vascular: No hyperdense vessel or unexpected calcification. Skull: Normal. Negative for fracture or focal lesion. Sinuses/Orbits: No acute finding. Other: None. IMPRESSION: No acute intracranial abnormality is noted. Chronic atrophic changes are noted. Electronically Signed   By: Inez Catalina M.D.   On: 11/25/2016 20:45   Ct Head Wo Contrast  Result Date: 11/23/2016 CLINICAL DATA:  83 year old female with left arm and face numbness onset today. Initial encounter. EXAM: CT HEAD WITHOUT CONTRAST TECHNIQUE: Contiguous axial images were obtained from the base of the skull through the vertex without intravenous contrast. COMPARISON:  11/07/2016 head CT.  11/07/2016 brain MR. FINDINGS: Brain: No intracranial hemorrhage or CT evidence of large acute infarct. Chronic microvascular changes. Global atrophy. No intracranial mass lesion noted on this unenhanced exam. Vascular: Vascular calcifications. Skull: No acute abnormality. Sinuses/Orbits: No acute orbital abnormality. Visualized paranasal sinuses clear. Other: Mastoid air cells and middle ear cavities clear. IMPRESSION: No intracranial hemorrhage or CT evidence of large acute infarct. Chronic microvascular changes. Atrophy. Electronically Signed   By: Genia Del M.D.   On: 11/23/2016 14:18   Ct Head Wo Contrast  Result Date: 11/06/2016 CLINICAL DATA:  Left-sided facial and hand numbness. EXAM: CT  HEAD WITHOUT CONTRAST TECHNIQUE: Contiguous axial images were obtained from the base of the skull through the vertex without intravenous contrast. COMPARISON:  Head CT 10/01/2015 FINDINGS: Brain: No mass lesion, intraparenchymal hemorrhage or extra-axial collection. No evidence of acute cortical infarct. Brain parenchyma and CSF-containing spaces are normal for age. 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 Vascular: No hyperdense vessel or unexpected calcification. Skull: Normal visualized skull base, calvarium and extracranial soft tissues. Sinuses/Orbits: No sinus fluid levels or advanced mucosal thickening. No mastoid effusion. Normal orbits. IMPRESSION: No acute intracranial abnormality. Mild atrophy, unchanged. Small, old left cerebellar infarct. Electronically Signed   By: Ulyses Jarred M.D.   On: 11/06/2016 22:00   Mr Brain Wo Contrast  Result Date: 11/26/2016 CLINICAL DATA:  Intermittent left facial and left upper extremity numbness. EXAM: MRI HEAD WITHOUT CONTRAST TECHNIQUE: Multiplanar, multiecho pulse sequences of the brain and surrounding structures were obtained without intravenous contrast. COMPARISON:  Head CT 11/25/2016 and MRI 11/23/2016 FINDINGS: Brain: There is no evidence of acute infarct, mass, midline shift, or extra-axial fluid collection. As described on prior studies, there are numerous chronic microhemorrhages scattered throughout the peripheral aspects of both cerebral hemispheres as well as in the pons and left cerebellum, and these may reflect the patient's history of hypertension  or possibly cerebral amyloid angiopathy. Small chronic infarcts are again seen in the right occipital lobe and left cerebellum. Small foci of T2 hyperintensity scattered elsewhere throughout the subcortical and deep cerebral white matter bilaterally are unchanged and nonspecific but compatible with mild chronic small vessel ischemic disease. Generalized cerebral atrophy is not greater than expected for patient's  age. Vascular: Major intracranial vascular flow voids are preserved. Skull and upper cervical spine: Unremarkable bone marrow signal. Sinuses/Orbits: Bilateral cataract extraction. Paranasal sinuses and mastoid air cells are clear. Other: None. IMPRESSION: 1. No acute intracranial abnormality. 2. Chronic small vessel ischemic disease and chronic microhemorrhages as above. Electronically Signed   By: Logan Bores M.D.   On: 11/26/2016 08:37   Mr Brain Wo Contrast (neuro Protocol)  Result Date: 11/23/2016 CLINICAL DATA:  Transient ischemic attack EXAM: MRI HEAD WITHOUT CONTRAST TECHNIQUE: Multiplanar, multiecho pulse sequences of the brain and surrounding structures were obtained without intravenous contrast. COMPARISON:  Brain MRI 11/07/2016 FINDINGS: Brain: The midline structures are normal. There is no focal diffusion restriction to indicate acute infarct. There is multifocal hyperintense T2-weighted signal within the periventricular white matter, most often seen in the setting of chronic microvascular ischemia. Old left cerebellar infarct. There are numerous foci of chronic microhemorrhage scattered in a predominantly peripheral distribution. Brain volume is normal for age without lobar predominant atrophy. The dura is normal and there is no extra-axial collection. Vascular: Major intracranial arterial and venous sinus flow voids are preserved. Skull and upper cervical spine: The visualized skull base, calvarium, upper cervical spine and extracranial soft tissues are normal. Sinuses/Orbits: No fluid levels or advanced mucosal thickening. No mastoid or middle ear effusion. Normal orbits. IMPRESSION: 1. No acute intracranial abnormality. 2. Numerous scattered chronic microhemorrhages in a predominantly peripheral distribution, which may indicate underlying cerebral amyloid angiopathy. 3. 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 Electronically Signed   By: Ulyses Jarred M.D.   On:  11/23/2016 19:26   Mr Brain Wo Contrast  Result Date: 11/07/2016 CLINICAL DATA:  Left arm and facial numbness.  Hypertensive urgency. EXAM: MRI HEAD WITHOUT CONTRAST MRA HEAD WITHOUT CONTRAST TECHNIQUE: Multiplanar, multiecho pulse sequences of the brain and surrounding structures were obtained without intravenous contrast. Angiographic images of the head were obtained using MRA technique without contrast. COMPARISON:  Head CT 11/06/2016. Brain MRI 08/03/2015. Head MRA 03/12/2015. FINDINGS: MRI HEAD FINDINGS Brain: There is no evidence of acute infarct, mass, midline shift, or extra-axial fluid collection. There are multiple chronic microhemorrhages located peripherally throughout both cerebral hemispheres, more numerous than on the prior MRI although some of this discrepancy may be due to differences in technique given the use of susceptibility weighted imaging on the current examination. There are also 2 chronic microhemorrhages in the left cerebellum which were not apparent on the prior MRI. Small foci of T2 hyperintensity scattered throughout the subcortical and deep cerebral white matter are similar to the prior MRI and nonspecific but compatible with mild chronic small vessel ischemic disease. Small chronic left cerebellar and right occipital infarcts are unchanged. Generalized cerebral atrophy is within normal limits for age. Vascular: Major intracranial vascular flow voids are preserved. Skull and upper cervical spine: Unremarkable bone marrow signal. Sinuses/Orbits: Bilateral cataract extraction. Paranasal sinuses and mastoid air cells are clear. Other: None. MRA HEAD FINDINGS The visualized distal vertebral arteries are patent to the basilar and codominant without significant stenosis. Patent left PICA, right AICA, and bilateral SCA origins are identified. The basilar artery is widely patent. Posterior communicating arteries  are not identified. The PCAs are patent with an unchanged severe proximal  right P2 stenosis. Segmental moderate to severe left mid P2 stenosis is new. The internal carotid arteries are patent from skullbase to carotid termini. Mild left ICA stenosis at the level of the anterior genu is similar to the prior MRA. 1 mm inferiorly directed bulge from the left supraclinoid ICA is unchanged and may represent an infundibulum rather than aneurysm. The ACAs and MCAs are patent without evidence of proximal branch occlusion or flow limiting proximal stenosis. Mild distal right A1 stenosis is similar to the prior study. Decreased number of right MCA branch vessels compared to the left is unchanged. IMPRESSION: 1. No acute intracranial abnormality. 2. Chronic microhemorrhages throughout the cerebral greater than cerebellar hemispheres, likely increased in number from the 2017 MRI in which may be secondary to chronic hypertension. 3. Mild chronic small vessel ischemic disease. 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 4. No large vessel occlusion. 5. New moderate to severe left P2 stenosis. Unchanged severe right P2 stenosis. Unchanged mild left ICA and right ACA stenoses. Electronically Signed   By: Logan Bores M.D.   On: 11/07/2016 09:04   Mr Jodene Nam Head Wo Contrast  Result Date: 11/07/2016 CLINICAL DATA:  Left arm and facial numbness.  Hypertensive urgency. EXAM: MRI HEAD WITHOUT CONTRAST MRA HEAD WITHOUT CONTRAST TECHNIQUE: Multiplanar, multiecho pulse sequences of the brain and surrounding structures were obtained without intravenous contrast. Angiographic images of the head were obtained using MRA technique without contrast. COMPARISON:  Head CT 11/06/2016. Brain MRI 08/03/2015. Head MRA 03/12/2015. FINDINGS: MRI HEAD FINDINGS Brain: There is no evidence of acute infarct, mass, midline shift, or extra-axial fluid collection. There are multiple chronic microhemorrhages located peripherally throughout both cerebral hemispheres, more numerous than on the prior MRI although some of this  discrepancy may be due to differences in technique given the use of susceptibility weighted imaging on the current examination. There are also 2 chronic microhemorrhages in the left cerebellum which were not apparent on the prior MRI. Small foci of T2 hyperintensity scattered throughout the subcortical and deep cerebral white matter are similar to the prior MRI and nonspecific but compatible with mild chronic small vessel ischemic disease. Small chronic left cerebellar and right occipital infarcts are unchanged. Generalized cerebral atrophy is within normal limits for age. Vascular: Major intracranial vascular flow voids are preserved. Skull and upper cervical spine: Unremarkable bone marrow signal. Sinuses/Orbits: Bilateral cataract extraction. Paranasal sinuses and mastoid air cells are clear. Other: None. MRA HEAD FINDINGS The visualized distal vertebral arteries are patent to the basilar and codominant without significant stenosis. Patent left PICA, right AICA, and bilateral SCA origins are identified. The basilar artery is widely patent. Posterior communicating arteries are not identified. The PCAs are patent with an unchanged severe proximal right P2 stenosis. Segmental moderate to severe left mid P2 stenosis is new. The internal carotid arteries are patent from skullbase to carotid termini. Mild left ICA stenosis at the level of the anterior genu is similar to the prior MRA. 1 mm inferiorly directed bulge from the left supraclinoid ICA is unchanged and may represent an infundibulum rather than aneurysm. The ACAs and MCAs are patent without evidence of proximal branch occlusion or flow limiting proximal stenosis. Mild distal right A1 stenosis is similar to the prior study. Decreased number of right MCA branch vessels compared to the left is unchanged. IMPRESSION: 1. No acute intracranial abnormality. 2. Chronic microhemorrhages throughout the cerebral greater than cerebellar hemispheres, likely  increased in  number from the 2017 MRI in which may be secondary to chronic hypertension. 3. Mild chronic small vessel ischemic disease. 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 4. No large vessel occlusion. 5. New moderate to severe left P2 stenosis. Unchanged severe right P2 stenosis. Unchanged mild left ICA and right ACA stenoses. Electronically Signed   By: Logan Bores M.D.   On: 11/07/2016 09:04    Microbiology: No results found for this or any previous visit (from the past 240 hour(s)).   Labs: Basic Metabolic Panel:  Recent Labs Lab 11/23/16 1335 11/23/16 1353 11/25/16 2028 11/25/16 2029 11/26/16 0532  NA 138 141 133* 138 140  K 3.8 3.8 3.5 3.7 3.6  CL 101 99* 97* 98* 103  CO2 30  --  26  --  28  GLUCOSE 107* 101* 108* 105* 111*  BUN 25* 31* 39* 37* 31*  CREATININE 0.94 1.00 1.56* 1.60* 1.05*  CALCIUM 9.0  --  8.7*  --  9.0   Liver Function Tests:  Recent Labs Lab 11/23/16 1335 11/25/16 2028 11/26/16 0532  AST 26 24 23   ALT 21 18 19   ALKPHOS 83 84 83  BILITOT 0.6 0.3 0.5  PROT 6.3* 6.2* 6.1*  ALBUMIN 3.9 3.9 3.6   No results for input(s): LIPASE, AMYLASE in the last 168 hours. No results for input(s): AMMONIA in the last 168 hours. CBC:  Recent Labs Lab 11/23/16 1335 11/23/16 1353 11/25/16 2028 11/25/16 2029 11/26/16 0532  WBC 4.6  --  4.9  --  3.7*  NEUTROABS 3.2  --  2.9  --   --   HGB 12.6 12.9 12.1 12.6 12.1  HCT 39.0 38.0 37.6 37.0 38.1  MCV 92.2  --  92.4  --  92.3  PLT 196  --  194  --  187   Cardiac Enzymes: No results for input(s): CKTOTAL, CKMB, CKMBINDEX, TROPONINI in the last 168 hours. BNP: BNP (last 3 results) No results for input(s): BNP in the last 8760 hours.  ProBNP (last 3 results) No results for input(s): PROBNP in the last 8760 hours.  CBG: No results for input(s): GLUCAP in the last 168 hours.     SignedCristal Ford  [facility] 11/26/2016, 2:40 PM

## 2016-11-27 LAB — URINE CULTURE

## 2016-11-27 NOTE — Telephone Encounter (Signed)
Left message for patient's granddaughter that I will put patient on our waiting list and call if we get an opening.

## 2016-11-27 NOTE — Telephone Encounter (Signed)
Pt's granddaughter would please like a call back at [phone] to discuss an earlier appointment to get in to see Dr Posey Pronto

## 2016-12-01 ENCOUNTER — Ambulatory Visit
Admission: RE | Admit: 2016-12-01 | Discharge: 2016-12-01 | Disposition: A | Discharge status: Home / Self Care | Payer: Medicare Other | Source: Ambulatory Visit | Attending: Internal Medicine | Admitting: Internal Medicine

## 2016-12-01 DIAGNOSIS — R590 Localized enlarged lymph nodes: Secondary | ICD-10-CM

## 2016-12-01 MED ORDER — IOPAMIDOL (ISOVUE-300) INJECTION 61%
75.0000 mL | Freq: Once | INTRAVENOUS | Status: AC | PRN
  Administered 2016-12-01: 75 mL via INTRAVENOUS

## 2016-12-14 ENCOUNTER — Ambulatory Visit: Payer: Medicare Other | Admitting: Neurology

## 2016-12-14 ENCOUNTER — Telehealth: Payer: Self-pay | Admitting: Cardiovascular Disease

## 2016-12-14 ENCOUNTER — Encounter: Payer: Self-pay | Admitting: Neurology

## 2016-12-14 VITALS — BP 130/64 | HR 74 | Ht 64.0 in | Wt 172.0 lb

## 2016-12-14 DIAGNOSIS — R202 Paresthesia of skin: Secondary | ICD-10-CM | POA: Diagnosis not present

## 2016-12-14 DIAGNOSIS — R29818 Other symptoms and signs involving the nervous system: Secondary | ICD-10-CM | POA: Diagnosis not present

## 2016-12-14 NOTE — Telephone Encounter (Signed)
Patient scheduled to see Dr.Berry on 12-29-16 @3 :45 for irregular ct chest scan

## 2016-12-14 NOTE — Progress Notes (Signed)
Follow-up Visit   Date: 12/14/16  Maria [name] MRN: [id number] DOB: [birth year]   Interim History: Maria [name] is a 83 y.o. right-handed female with hypertension, hyperlipidemia, neuropathy, former tobacco use, and migraines returning to the clinic for follow-up of spells of left sided paresthesias ?TIA vs complex migraine.  The patient was accompanied to the clinic by self.  History of present illness: She reports having three stereotyped spells of left face and arm numbness since January 2017.  She has numbness around the left cheek, nose and chin and left dorsum of the hand and forearm.  She occasionally with slurred speech.  She does not have headache, vision changes, or limb weakness. She had had three distinct spells in January, June, and August 2017.  She had stroke work-up in January 2017 which showed likely peripheral artifact over the parietal-occipital junction, less likely tiny infarct, on my review.  She has moderate intracranial stenosis involving left ACA and and high-grade stenosis of the right proximal P2 and distal left P2.  She was started on aspirin 325mg  daily for secondary prevention.  She was evaluated by the stroke team who felt symptoms were more suggestive of complex migraine.  Since this time, she had two additional spells again with focal left face and arm numbness, lasting < 1 hour.  MRI brain in June and CT head in August did not show acute stroke.    She also has history neuropathy for about the past 5 years manifesting with tingling involving the feet. Symptoms are not bothersome enough to take any medications.  She endorses mild imbalance.   She walks with a cane since she had bilateral hip replacement (2011, 2014).  She had not suffered any falls.  She used to have migraines but this resolved after menopause.  UPDATE 03/06/2016:    She has not had any more spells of left face or leg numbness.  She endorses some mild memory changes, such as  remembering people names.  She is highly independent and has self restricted her driving to her neighborhood only.  She manages her own finances with difficulty, cooks, and cleans her home.  She complains of dry mouth and hoarseness which started after taking a muscle relaxant for leg cramps.  Her medication list includes both flexeril and Robaxin but she does not know which one she is taking.  Her blood pressure has been slightly elevated, staying SBP 130-150s.  She denies any headaches or new neurological complaints.   UPDATE 08/05/2016:  She is here for 6 month appointment.  There have been no new neurological issues since her last visit.  She denies any numbness/tinging, weakness, or vision changes.  She stay active and goes to the [facility] daily to walk an hour.  She continues to do her own IADLs and enjoys quilting. She is no longer having muscle cramps.  She complains of a dry cough since starting lisinopril and will discuss this with her PCP.  She suffered one fall several months ago while carrying some pastries in her hands, so could not use her cane.  Fortunately, she did not hurt herself.  UPDATE 12/14/2016:   She is here for follow-up visit because of frequent ER visits for left sided face and arm numbness, lasting anywhere from 20 min - 4 hours.  Her blood pressure has always been elevated with these spells (SBP 190-200s).  She had extensive TIA/stroke evaluation in September 2018 which was unrevealing.  She was started on dual antiplatelet therapy  with aspirin 81mg  and plavix 75mg .  she was also started on amlodipine 2.5 mg which has improved her blood pressure and she has not had any further spells since starting this medication.  She denies any headache, left face/arm weakness, or loss of consciousness with these events.    Medications:  Current Outpatient Medications on File Prior to Visit  Medication Sig Dispense Refill  . amLODipine (NORVASC) 2.5 MG tablet Take 1 tablet (2.5 mg total) by mouth  daily. 30 tablet 3  . aspirin EC 81 MG EC tablet Take 1 tablet (81 mg total) by mouth daily. 30 tablet 4  . atorvastatin (LIPITOR) 20 MG tablet Take 1 tablet (20 mg total) by mouth at bedtime. 30 tablet 4  . Cholecalciferol (VITAMIN D3) 2000 UNITS TABS Take 2,000 Units by mouth daily.    . clopidogrel (PLAVIX) 75 MG tablet TAKE ONE TABLET BY MOUTH ONCE DAILY 30 tablet 11  . Cyanocobalamin (VITAMIN B-12 IJ) Inject 1,000 mcg as directed every 30 (thirty) days.    Marland Kitchen losartan (COZAAR) 100 MG tablet Take 100 mg by mouth daily.    . metoprolol succinate (TOPROL-XL) 25 MG 24 hr tablet Take 1 tablet (25 mg total) by mouth daily with breakfast. 30 tablet 0  . Multiple Vitamins-Minerals (PRESERVISION AREDS PO) Take 1 tablet by mouth 2 (two) times daily.    Marland Kitchen triamcinolone cream (KENALOG) 0.5 % Apply 1 application topically 2 (two) times daily as needed (rash).      No current facility-administered medications on file prior to visit.     Allergies:  Allergies  Allergen Reactions  . Codeine Nausea And Vomiting  . Tylenol [Acetaminophen]     ITCHING  . Ibuprofen Itching and Rash    Review of Systems:  CONSTITUTIONAL: No fevers, chills, night sweats, or weight loss.  EYES: No visual changes or eye pain, no dry mouth ENT: No hearing changes.  No history of nose bleeds.   RESPIRATORY: +cough, wheezing and shortness of breath.   CARDIOVASCULAR: Negative for chest pain, and palpitations.   GI: Negative for abdominal discomfort, blood in stools or black stools.  No recent change in bowel habits.   GU:  No history of incontinence.   MUSCLOSKELETAL: No history of joint pain or swelling.  No myalgias.   SKIN: Negative for lesions, rash, and itching.   ENDOCRINE: Negative for cold or heat intolerance, polydipsia or goiter.   PSYCH:  +depression or anxiety symptoms.   NEURO: As Above.   Vital Signs:  BP 130/64   Pulse 74   Ht 5\' 4"  (1.626 m)   Wt 172 lb (78 kg)   SpO2 96%   BMI 29.52 kg/m    Neurological Exam: MENTAL STATUS including orientation to time, place, person is normal   CRANIAL NERVES:   Face is symmetric.  MOTOR:  Motor strength is 5/5 in all extremities.     COORDINATION/GAIT:   Gait mildly-wide based, stable unassisted.   Data: CT head 10/01/2015:  No evidence of acute intracranial abnormality.  Atrophy. Small left cerebellar infarct, chronic.  MRI brain wo contrast 08/03/2015:   No acute intracranial process.  New nonacute LEFT temporal microhemorrhage versus artifact. Minimal chronic small vessel ischemic disease. Small LEFT cerebellar infarct.  MRI brain wwo contrast 03/12/2015:  Tiny acute infarct versus artifact peripheral aspect right parietal-occipital lobe junction. Otherwise no evidence of acute infarct. Remote small infarct occipital lobes. Mild to moderate chronic microvascular ischemic changes. No intracranial hemorrhage. No intracranial mass or abnormal  enhancement. Global moderate atrophy without hydrocephalus.  MRA HEAD Mild to moderate narrowing anterior aspect left internal carotid artery cavernous segment. Mild narrowing supraclinoid segment of the internal carotid artery bilaterally.  Mild to moderate focal stenosis junction A1 and A2 segment left anterior cerebral artery.  Middle cerebral artery branch vessel mild to moderate irregularity narrowing with decrease number of visualized right middle cerebral are branches compared to the left. No significant stenosis of the M1 segment or carotid terminus on either side.  Ectatic vertebral arteries with mild narrowing on the right. Nonvisualized right posterior inferior cerebellar artery and left anterior inferior cerebellar artery. No high-grade stenosis of the basilar artery. High-grade stenosis proximal P2 segment right posterior cerebral artery. Moderate narrowing distal P2 segment left posterior cerebral artery. Posterior cerebral artery distal branch vessel narrowing and  irregularity bilaterally.  MRA NECK Plaque with narrowing proximal right internal carotid artery (less than 50%). Ectasia of the right internal carotid artery beyond this region with slight irregularity. Ectatic left internal carotid artery without significant stenosis. Mild to moderate narrowing proximal aspect of the vertebral arteries bilaterally. Mild narrowing proximal subclavian arteries bilaterally.  September 2018 hospitalization - MRI of the brain showed no acute intracranial abnormality, chronic microhemorrhages hrough out the cerebral, greater than cerebellar hemispheres, likely increased from 2017 may be secondary to chronic hypertension - MRA showed no large vessel occlusion, new moderate to severe left P2 stenosis, unchanged severe right P2 stenosis, unchanged mild left ICA and right ACA stenosis - LDL 112, Placed on Lipitor - Carotid Dopplers showed 1-39% ICA stenosis - neurology consulted, placed on aspirin 81 mg daily with Plavix - PT recommended home PT follow-up, back to baseline - 2-D echo showed EF of 65-70% with grade 1 diastolic dysfunction, no regional wall motion abnormalities    IMPRESSION/PLAN: Stereotype spells of left face and arm numbness, initially concerning for TIA, however these are too frequent so other alternatives need to be considered. Malignant hypertension manifesting neurological symptoms vs. complex migraine, and less likely seizure.  Neurological workup has not disclosed stroke on imaging. She has intracranial stenosis with severe bilateral P2 stenosis, but this would not manifest with left paresthesias.  At this juncture, she is being treated with maximal medical therapy for stroke prevention with dual antiplatelet therapy, statin therapy, and blood pressure is well controlled.  Recommend staying on these medications for now, if she remains stable in 3 months, continue her on monotherapy with Plavix alone.  Given that her spells are well-controlled  with better management of her blood pressure, malignant hypertension is possible. To be complete, she will have routine EEG, although my overall suspicion is low for seizures as these spells are prolonged. If she continues to have these events despite adequate blood pressure management, complex migraine is likely.  I do not recommend any further escalation in her antiplatelet therapy.   Return to clinic in 3 months.  Greater than 50% of this 30 minute visit was spent in counseling, explanation of diagnosis, planning of further management, and coordination of care due to the complexity of her presentation.    Thank you for allowing me to participate in patient's care.  If I can answer any additional questions, I would be pleased to do so.    Sincerely,    Everli Rother K. Posey Pronto, DO

## 2016-12-14 NOTE — Patient Instructions (Signed)
Routine EEG  Continue your medications as you are taking  Return to clinic in 3 months

## 2016-12-17 ENCOUNTER — Ambulatory Visit (INDEPENDENT_AMBULATORY_CARE_PROVIDER_SITE_OTHER): Payer: Medicare Other | Admitting: Neurology

## 2016-12-17 DIAGNOSIS — R29818 Other symptoms and signs involving the nervous system: Secondary | ICD-10-CM | POA: Diagnosis not present

## 2016-12-28 NOTE — Procedures (Signed)
ELECTROENCEPHALOGRAM REPORT  Date of Study: 12/28/2016  Patient's Name: Maria [name] MRN: [id number] Date of Birth: 07/15/1922  Referring Provider: Dr. Narda Amber  Clinical History: This is a 83 year old woman with stereotyped episodes of left face and arm numbness.   Medications: NORVASC 2.5 MG tablet aspirin EC 81 MG EC tablet LIPITOR 20 MG tablet VITAMIN D3 2000 UNITS TABS  PLAVIX 75 MG tablet VITAMIN B-12 IJ COZAAR 100 MG tablet  TOPROL-XL 25 MG 24 hr tablet  PRESERVISION AREDS PO triamcinolone cream (KENALOG) 0.5 %   Technical Summary: A multichannel digital EEG recording measured by the international 10-20 system with electrodes applied with paste and impedances below 5000 ohms performed in our laboratory with EKG monitoring in an awake and asleep patient.  Hyperventilation was not performed. Photic stimulation was performed.  The digital EEG was referentially recorded, reformatted, and digitally filtered in a variety of bipolar and referential montages for optimal display.    Description: The patient is awake and asleep during the recording.  During maximal wakefulness, there is a symmetric, medium voltage 9-9.5 Hz posterior dominant rhythm that attenuates with eye opening.  The record is symmetric.  During drowsiness and sleep, there is an increase in theta slowing of the background, with shifting asymmetry over the bilateral temporal regions, right more than left.  Vertex waves and symmetric sleep spindles were seen.  Photic stimulation did not elicit any abnormalities.  There were no epileptiform discharges or electrographic seizures seen.    EKG lead was unremarkable.  Impression: This awake and asleep EEG is within normal limits for age.  Clinical Correlation: A normal EEG does not exclude a clinical diagnosis of epilepsy.  If further clinical questions remain, prolonged EEG may be helpful.  Clinical correlation is advised.   Ellouise Newer, M.D.

## 2016-12-29 ENCOUNTER — Encounter: Payer: Self-pay | Admitting: Cardiovascular Disease

## 2016-12-29 ENCOUNTER — Ambulatory Visit: Payer: Medicare Other | Admitting: Cardiovascular Disease

## 2016-12-29 VITALS — BP 125/75 | HR 84 | Wt 171.4 lb

## 2016-12-29 DIAGNOSIS — E7800 Pure hypercholesterolemia, unspecified: Secondary | ICD-10-CM | POA: Diagnosis not present

## 2016-12-29 DIAGNOSIS — G459 Transient cerebral ischemic attack, unspecified: Secondary | ICD-10-CM

## 2016-12-29 DIAGNOSIS — I10 Essential (primary) hypertension: Secondary | ICD-10-CM

## 2016-12-29 NOTE — Assessment & Plan Note (Signed)
History of recurrent multiple TIAs recently with normal 2-D echo and negative carotid Doppler study. I'm going to order a 30 day event monitor rule out occult PAF as a etiology.

## 2016-12-29 NOTE — Assessment & Plan Note (Signed)
History of essential hypertension blood pressure measured 125/75. She is on amlodipine, losartan and metoprolol. Continue current meds at current dosing

## 2016-12-29 NOTE — Progress Notes (Signed)
12/29/2016 Maria [name]   09/01/1922  [id number]  Primary Physician Jani Gravel, MD Primary Cardiologist: Lorretta Harp MD [name], [name]  HPI:  Maria [name] is a 83 y.o. female mildly overweight married Caucasian female (second marriage) who just celebrated her 59th wedding anniversary. She is a mother of 2 children one of whom is deceased and 2 granddaughters. She is accompanied by her great-granddaughter Mardi Mainland today. She was referred by Dr. Maudie Mercury for cardiovascular evaluation. In the past she's worked as a Network engineer at [employer] is also worked at a Biochemist, clinical and an Metallurgist. She does have a history of hyperlipidemia currently not on statin therapy and hypertension treated with beta blocker. She's never had a heart attack or stroke. She denies chest pain or shortness of breath. She did have to total hip replacement in 2011 and 2013. She walks with a cane and is relatively asymptomatic. Since I saw HER-2 years ago she's had recent admissions for TIA. A 2-D echo and carotid Dopplers were unremarkable. She is otherwise a symptomatic.   Current Meds  Medication Sig  . amLODipine (NORVASC) 2.5 MG tablet Take 1 tablet (2.5 mg total) by mouth daily.  [name] aspirin EC 81 MG EC tablet Take 1 tablet (81 mg total) by mouth daily.  [name] atorvastatin (LIPITOR) 20 MG tablet Take 1 tablet (20 mg total) by mouth at bedtime.  . Cholecalciferol (VITAMIN D3) 2000 UNITS TABS Take 2,000 Units by mouth daily.  . clopidogrel (PLAVIX) 75 MG tablet TAKE ONE TABLET BY MOUTH ONCE DAILY  . Cyanocobalamin (VITAMIN B-12 IJ) Inject 1,000 mcg as directed every 30 (thirty) days.  [name] losartan (COZAAR) 100 MG tablet Take 100 mg by mouth daily.  . metoprolol succinate (TOPROL-XL) 25 MG 24 hr tablet Take 1 tablet (25 mg total) by mouth daily with breakfast.  . Multiple Vitamins-Minerals (PRESERVISION AREDS PO) Take 1 tablet by mouth 2 (two) times daily.    [name] triamcinolone cream (KENALOG) 0.5 % Apply 1 application topically 2 (two) times daily as needed (rash).      Allergies  Allergen Reactions  . Codeine Nausea And Vomiting  . Tylenol [Acetaminophen]     ITCHING  . Ibuprofen Itching and Rash    Social History   Socioeconomic History  . Marital status: Married    Spouse name: Not on file  . Number of children: Not on file  . Years of education: Not on file  . Highest education level: Not on file  Social Needs  . Financial resource strain: Not on file  . Food insecurity - worry: Not on file  . Food insecurity - inability: Not on file  . Transportation needs - medical: Not on file  . Transportation needs - non-medical: Not on file  Occupational History  . Not on file  Tobacco Use  . Smoking status: Former Research scientist (life sciences)  . Smokeless tobacco: Never Used  . Tobacco comment: 77 YRS AGO  Substance and Sexual Activity  . Alcohol use: No  . Drug use: No  . Sexual activity: Not on file  Other Topics Concern  . Not on file  Social History Narrative   Lives with husband in a one story home.  They are married for 59 years.  Has 1 son.  Retired from working in Insurance underwriter.  Education: some college.     Review of Systems: General: negative for chills, fever, night sweats or weight changes.  Cardiovascular: negative for chest  pain, dyspnea on exertion, edema, orthopnea, palpitations, paroxysmal nocturnal dyspnea or shortness of breath Dermatological: negative for rash Respiratory: negative for cough or wheezing Urologic: negative for hematuria Abdominal: negative for nausea, vomiting, diarrhea, bright red blood per rectum, melena, or hematemesis Neurologic: negative for visual changes, syncope, or dizziness All other systems reviewed and are otherwise negative except as noted above.    Blood pressure 125/75, pulse 84, weight 171 lb 6.4 oz (77.7 kg).  General appearance: alert and no distress Neck: no adenopathy, no carotid bruit, no  JVD, supple, symmetrical, trachea midline and thyroid not enlarged, symmetric, no tenderness/mass/nodules Lungs: clear to auscultation bilaterally Heart: regular rate and rhythm, S1, S2 normal, no murmur, click, rub or gallop Extremities: extremities normal, atraumatic, no cyanosis or edema Pulses: 2+ and symmetric Skin: Skin color, texture, turgor normal. No rashes or lesions Neurologic: Alert and oriented X 3, normal strength and tone. Normal symmetric reflexes. Normal coordination and gait  EKG not performed today  ASSESSMENT AND PLAN:   Hyperlipidemia History of hyperlipidemia on statin therapy with recent lipid profile performed 11/07/16 revealed total cholesterol 170, LDL 112 and HDL of 51.  Essential hypertension History of essential hypertension blood pressure measured 125/75. She is on amlodipine, losartan and metoprolol. Continue current meds at current dosing  TIA (transient ischemic attack) History of recurrent multiple TIAs recently with normal 2-D echo and negative carotid Doppler study. I'm going to order a 30 day event monitor rule out occult PAF as a etiology.      Lorretta Harp MD FACP,FACC,FAHA, [facility] 12/29/2016 3:39 PM

## 2016-12-29 NOTE — Patient Instructions (Signed)
Medication Instructions: Your physician recommends that you continue on your current medications as directed. Please refer to the Current Medication list given to you today.   Testing/Procedures: Your physician has recommended that you wear a 30 day event monitor. Event monitors are medical devices that record the heart's electrical activity. Doctors most often us these monitors to diagnose arrhythmias. Arrhythmias are problems with the speed or rhythm of the heartbeat. The monitor is a small, portable device. You can wear one while you do your normal daily activities. This is usually used to diagnose what is causing palpitations/syncope (passing out).  Follow-Up: Your physician recommends that you schedule a follow-up appointment as needed with Dr. Berry.   

## 2016-12-29 NOTE — Assessment & Plan Note (Signed)
History of hyperlipidemia on statin therapy with recent lipid profile performed 11/07/16 revealed total cholesterol 170, LDL 112 and HDL of 51.

## 2017-01-12 ENCOUNTER — Ambulatory Visit (INDEPENDENT_AMBULATORY_CARE_PROVIDER_SITE_OTHER): Payer: Medicare Other

## 2017-01-12 ENCOUNTER — Other Ambulatory Visit: Payer: Self-pay | Admitting: Cardiovascular Disease

## 2017-01-12 DIAGNOSIS — I480 Paroxysmal atrial fibrillation: Secondary | ICD-10-CM | POA: Diagnosis not present

## 2017-01-12 DIAGNOSIS — G459 Transient cerebral ischemic attack, unspecified: Secondary | ICD-10-CM | POA: Diagnosis not present

## 2017-01-26 ENCOUNTER — Telehealth: Payer: Self-pay | Admitting: Cardiovascular Disease

## 2017-01-26 NOTE — Telephone Encounter (Signed)
Needs to come in this week to see a mid-level provider.

## 2017-01-26 NOTE — Telephone Encounter (Signed)
Scheduled appt for 12-21 pt agreees to be here at 230

## 2017-01-26 NOTE — Telephone Encounter (Signed)
Received call from [employer] - reporting 1st occurrence of A Fib @ 100 bpm. He did not specify time of onset or duration, but will send report to our clinical fax.  Pt seen by Dr. Gwenlyn Found on 11/20 for eval following TIA. She was placed on heart monitor 01/12/17  She is not currently on anticoagulation.  Follow-up call to patient home went unanswered.  Routed to Dr. Gwenlyn Found as Juluis Rainier.

## 2017-01-29 ENCOUNTER — Ambulatory Visit: Payer: Medicare Other | Admitting: Cardiovascular Disease

## 2017-01-29 ENCOUNTER — Encounter: Payer: Self-pay | Admitting: Cardiovascular Disease

## 2017-01-29 DIAGNOSIS — I480 Paroxysmal atrial fibrillation: Secondary | ICD-10-CM | POA: Insufficient documentation

## 2017-01-29 MED ORDER — APIXABAN 5 MG PO TABS: 5.0000 mg | ORAL_TABLET | Freq: Two times a day (BID) | ORAL | 6 refills | Status: DC

## 2017-01-29 MED ORDER — AMLODIPINE BESYLATE 5 MG PO TABS: 5.0000 mg | ORAL_TABLET | Freq: Every day | ORAL | 6 refills | Status: DC

## 2017-01-29 NOTE — Assessment & Plan Note (Signed)
Maria [name] returns today for follow-up of her event monitor done in evaluation of TIA. This did show atrial fibrillation. She is on aspirin and Plavix which will need to be changed to a novel oral anticoagulant. This patients CHA2DS2-VASc Score and unadjusted Ischemic Stroke Rate (% per year) is equal to 7.2 % stroke rate/year from a score of 5. Based on this, I'm going to discontinue her aspirin and Plavix and begin her on a novel oral anticoagulant.  Above score calculated as 1 point each if present [CHF, HTN, DM, Vascular=MI/PAD/Aortic Plaque, Age if 65-74, or Female] Above score calculated as 2 points each if present [Age > 75, or Stroke/TIA/TE]

## 2017-01-29 NOTE — Progress Notes (Signed)
Maria [name] returns today for follow-up of her event monitor done in evaluation of TIA. This did show atrial fibrillation. She is on aspirin and Plavix which will need to be changed to a novel oral anticoagulant. This patients CHA2DS2-VASc Score and unadjusted Ischemic Stroke Rate (% per year) is equal to 7.2 % stroke rate/year from a score of 5. Based on this, I'm going to discontinue her aspirin and Plavix and begin her on a novel oral anticoagulant.  Above score calculated as 1 point each if present [CHF, HTN, DM, Vascular=MI/PAD/Aortic Plaque, Age if 65-74, or Female] Above score calculated as 2 points each if present [Age > 75, or Stroke/TIA/TE]  Lorretta Harp, M.D., [place], [facility], [place], [place] [address]. [place], [place]  [address]  [phone] 01/29/2017 3:30 PM

## 2017-01-29 NOTE — Patient Instructions (Addendum)
Medication Instructions: Your physician recommends that you continue on your current medications as directed. Please refer to the Current Medication list given to you today.  STOP Aspirin 81 and Plavix (Clopidogrel)  START Eliquis 5 mg twice daily.  Increase Amlodipine to 5 mg daily. Your physician has requested that you regularly monitor and record your blood pressure readings at home. Please use the same machine at the same time of day to check your readings and record them to bring to your follow-up visit. Please call Erasmo Downer, PharmD [phone] in 3-4 weeks if any problems with your blood pressures.   Follow-Up: We request that you follow-up in: 3 months with an extender and in 6 months with Dr Andria Rhein will receive a reminder letter in the mail two months in advance. If you don't receive a letter, please call our office to schedule the follow-up appointment.  If you need a refill on your cardiac medications before your next appointment, please call your pharmacy.

## 2017-01-30 ENCOUNTER — Encounter: Payer: Self-pay | Admitting: Cardiovascular Disease

## 2017-02-03 ENCOUNTER — Other Ambulatory Visit: Payer: Self-pay | Admitting: Pharmacist Clinician (PhC)/ Clinical Pharmacy Specialist

## 2017-02-03 MED ORDER — AMLODIPINE BESYLATE 5 MG PO TABS: 5.0000 mg | ORAL_TABLET | Freq: Every day | ORAL | 6 refills | Status: DC

## 2017-02-05 IMAGING — MR MR HEAD WO/W CM
12 of 20 series · 23 of 48 positions shown · IV contrast (Yes   MULTIHANCE)
Comparison: 03/12/2015 head CT.

CLINICAL DATA: [AGE] hypertensive female with acute onset of
left-sided numbness and weakness (face and left upper extremity)
with associated difficulty speaking. Presently no complaints.
Subsequent encounter.

EXAM:
MRI HEAD WITHOUT AND WITH CONTRAST
MRA HEAD WITHOUT CONTRAST
MRA NECK WITHOUT AND WITH CONTRAST
TECHNIQUE: Multiplanar, multiecho pulse sequences of the brain and surrounding
structures were obtained without and with intravenous contrast.
Angiographic images of the Circle of Willis were obtained using MRA
technique without intravenous contrast. Angiographic images of the
neck were obtained using MRA technique without and with intravenous
contrast. Carotid stenosis measurements (when applicable) are
obtained utilizing NASCET criteria, using the distal internal
carotid diameter as the denominator.
CONTRAST:  16mL MULTIHANCE GADOBENATE DIMEGLUMINE 529 MG/ML IV SOLN

[Series 3: T1 · sagittal · 5.0mm · 0.47mm/px · 1 of 23 slices shown]
[im 1/23]
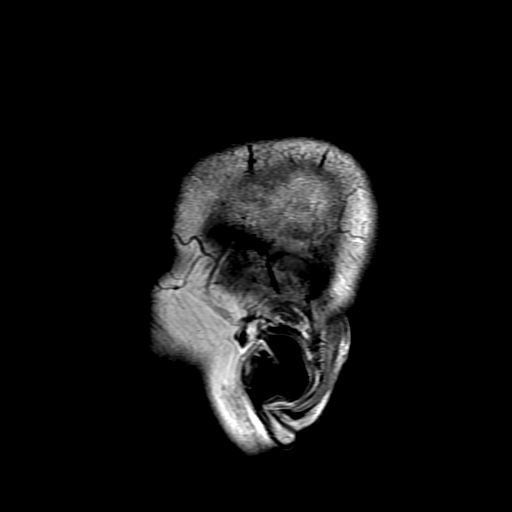

[Series 4: DWI · axial · 3.0mm · 1.09mm/px · z∈[-43,+107]mm · 4 of 102 slices shown (1 of 4)]
[im 1/102]
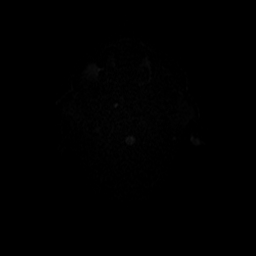
[im 34/102]
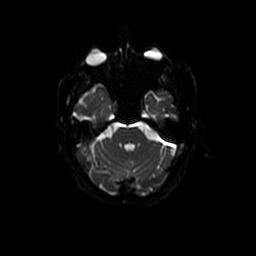
[im 68/102]
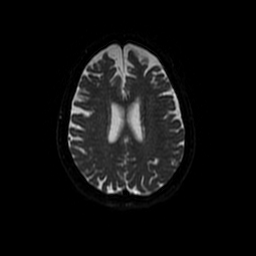
[im 102/102]
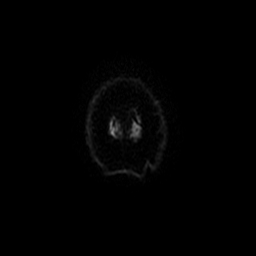

[Series 5: (id) mt fs · axial · 1.4mm · 0.43mm/px · z∈[-41,+53]mm · 5 of 136 slices shown]
[im 1/136]
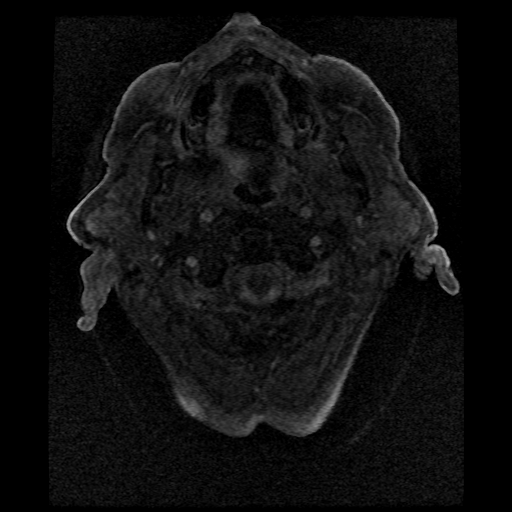
[im 34/136]
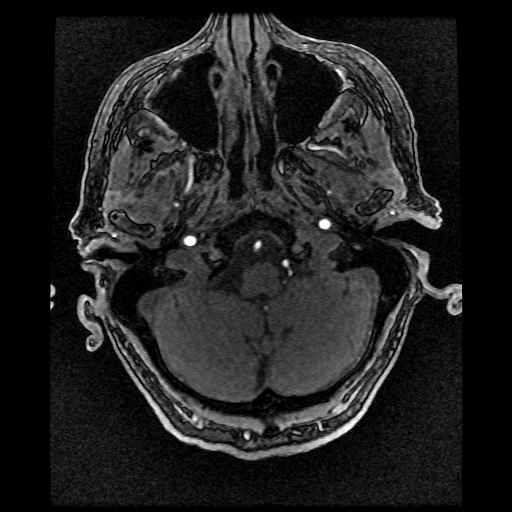
[im 68/136]
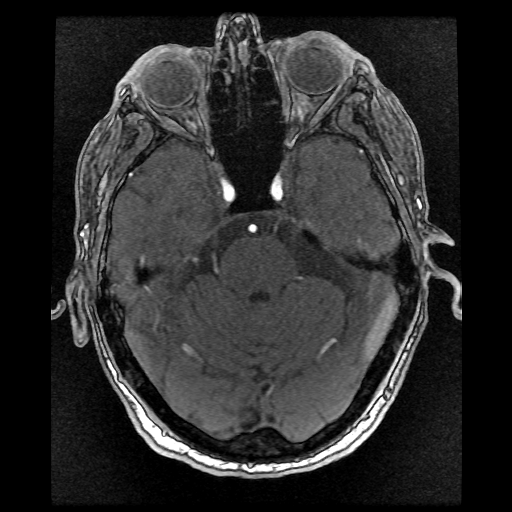
[im 102/136]
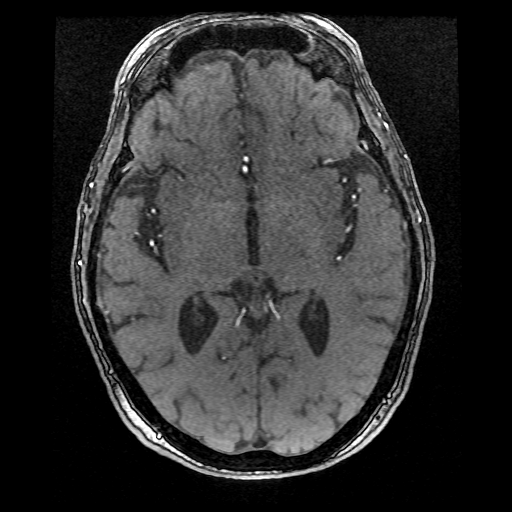
[im 136/136]
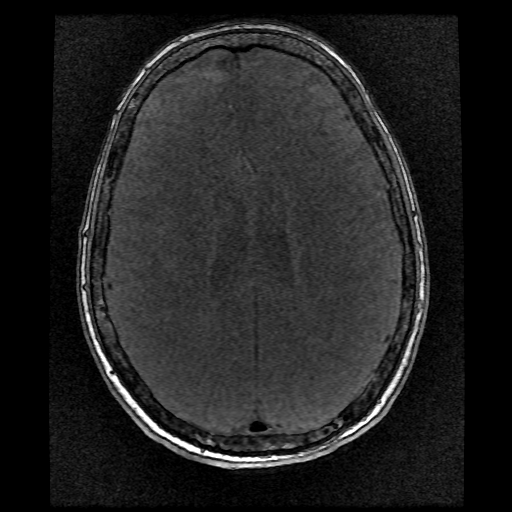

[Series 6: T2 · axial · 5.0mm · 0.43mm/px · 1 of 26 slices shown]
[im 1/26]
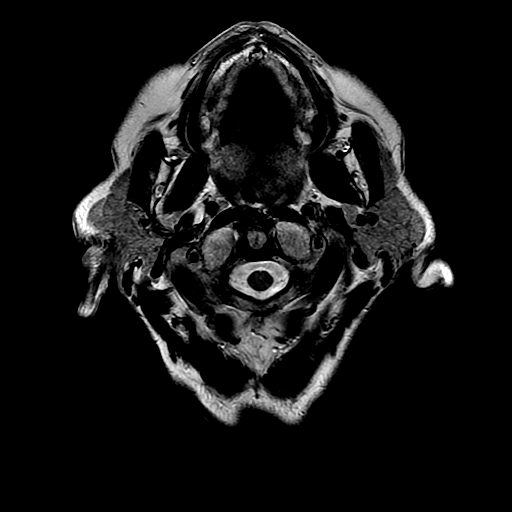

[Series 7: DWI · coronal · 5.0mm · 1.09mm/px · 3 of 70 slices shown (2 of 4)]
[im 1/70]
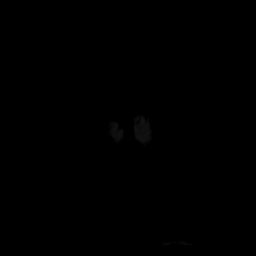
[im 35/70]
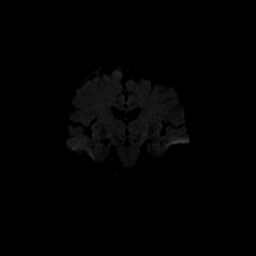
[im 70/70]
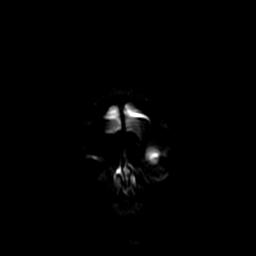

[Series 8: FLAIR · axial · 5.0mm · 0.43mm/px · 1 of 26 slices shown]
[im 1/26]
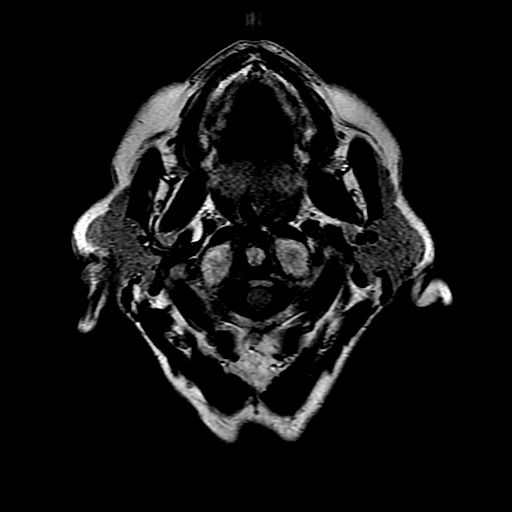

[Series 9: ax mpgr · axial · 5.0mm · 0.45mm/px · 1 of 26 slices shown]
[im 1/26]
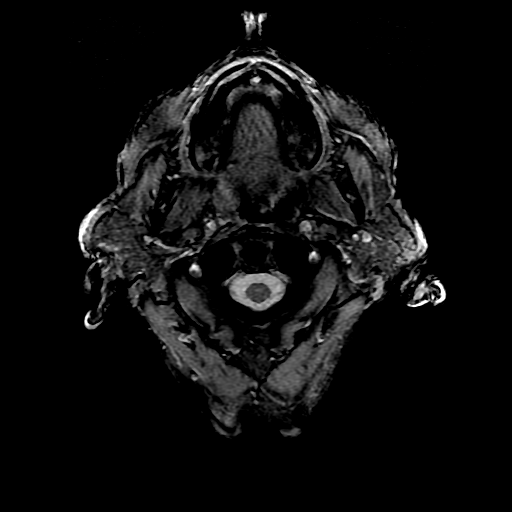

[Series 11: T2 post-contrast · coronal · 5.0mm · 0.39mm/px · 1 of 27 slices shown]
[im 1/27]
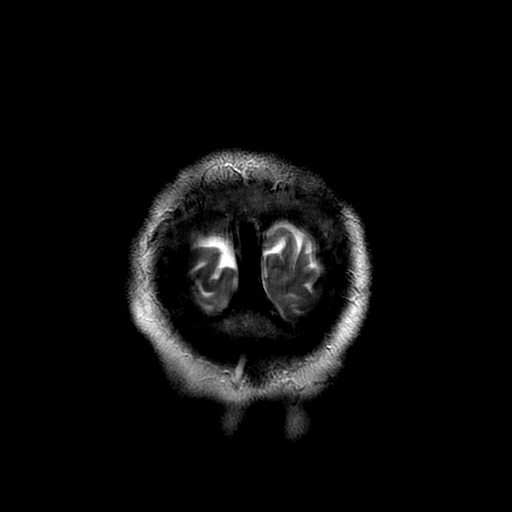

[Series 13: ax (id) · axial · 2.8mm · 0.47mm/px · z∈[-198,-151]mm · 2 of 140 slices shown]
[im 1/140]
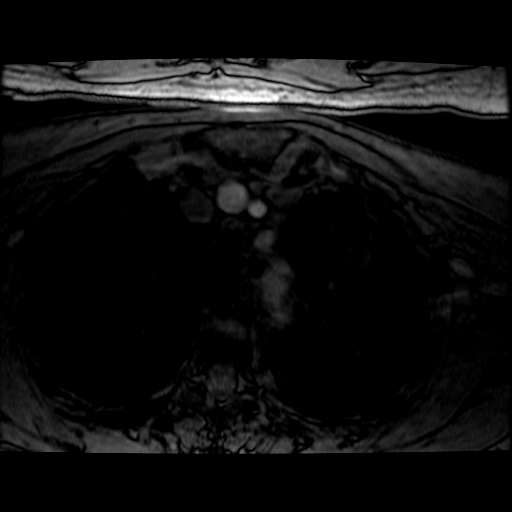
[im 35/140]
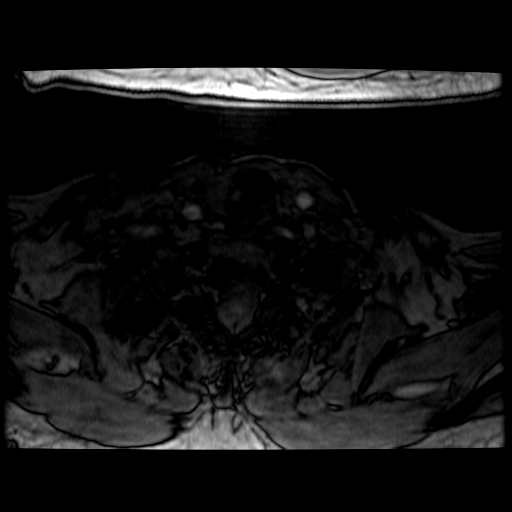

[Series 16: T1 post-contrast · coronal · 5.0mm · 0.39mm/px · 1 of 27 slices shown]
[im 1/27]
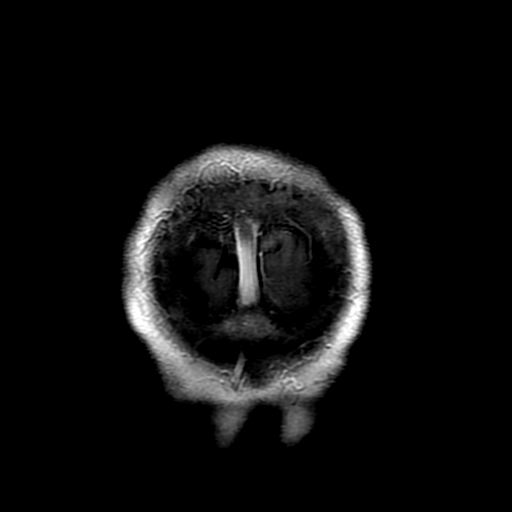

[Series 400: DWI · axial · 3.0mm · 1.09mm/px · z∈[-43,+107]mm · 2 of 51 slices shown (3 of 4)]
[im 1/51]
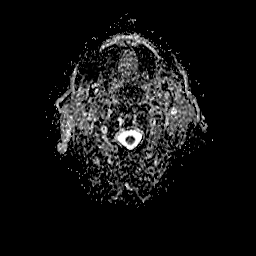
[im 51/51]
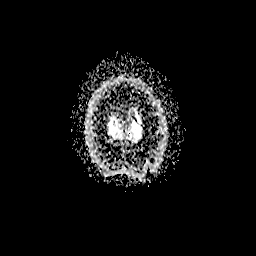

[Series 700: DWI · coronal · 5.0mm · 1.09mm/px · 1 of 35 slices shown (4 of 4)]
[im 1/35]
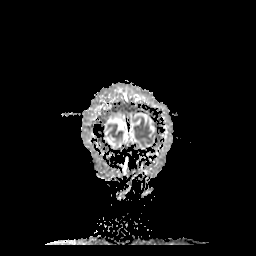

[23 of 48 positions shown; findings below may reference images not displayed]

FINDINGS: MRI HEAD FINDINGS

Tiny acute infarct versus artifact peripheral aspect right
parietal-occipital lobe junction. Otherwise no evidence of acute
infarct. Remote small infarct occipital lobes. Mild to moderate
chronic microvascular ischemic changes.

No intracranial hemorrhage.

No intracranial mass or abnormal enhancement.

Global moderate atrophy without hydrocephalus.

Post lens replacement otherwise orbital structures unremarkable.
Cervical medullary junction pituitary region unremarkable.

MRA HEAD FINDINGS

Mild to moderate narrowing anterior aspect left internal carotid
artery cavernous segment.

Mild narrowing supraclinoid segment of the internal carotid artery
bilaterally. Minimal bulge on left consistent with infundibulum
rather than aneurysm.

Mild to moderate focal stenosis junction A1 and A2 segment left
anterior cerebral artery.

Middle cerebral artery branch vessel mild to moderate irregularity
narrowing with decrease number of visualized right middle cerebral
are branches compared to the left. No significant stenosis of the M1
segment or carotid terminus on either side.

Ectatic vertebral arteries with mild narrowing on the right.

Nonvisualized right posterior inferior cerebellar artery and left
anterior inferior cerebellar artery.

No high-grade stenosis of the basilar artery.

High-grade stenosis proximal P2 segment right posterior cerebral
artery. Moderate narrowing distal P2 segment left posterior cerebral
artery. Posterior cerebral artery distal branch vessel narrowing and
irregularity bilaterally.

MRA NECK FINDINGS

Three vessel aortic arch.

Ectatic right common carotid artery without significant narrowing.
Plaque with narrowing proximal right internal carotid artery (less
than 50%). Ectasia of the right internal carotid artery beyond this
region with slight irregularity.

Ectatic left internal carotid artery without significant stenosis.

Mild moderate narrowing proximal aspect of the vertebral arteries
bilaterally. Proximal 3 cm of the vertebral arteries with
significant ectasia bilaterally.

Mild narrowing proximal subclavian arteries bilaterally.
IMPRESSION: MRI HEAD

Tiny acute infarct versus artifact peripheral aspect right
parietal-occipital lobe junction. Otherwise no evidence of acute
infarct.

Remote small infarct occipital lobes. Mild to moderate chronic
microvascular ischemic changes.

No intracranial hemorrhage.

No intracranial mass or abnormal enhancement.

Global moderate atrophy without hydrocephalus.

MRA HEAD

Mild to moderate narrowing anterior aspect left internal carotid
artery cavernous segment.

Mild narrowing supraclinoid segment of the internal carotid artery
bilaterally.

Mild to moderate focal stenosis junction A1 and A2 segment left
anterior cerebral artery.

Middle cerebral artery branch vessel mild to moderate irregularity
narrowing with decrease number of visualized right middle cerebral
are branches compared to the left. No significant stenosis of the M1
segment or carotid terminus on either side.

Ectatic vertebral arteries with mild narrowing on the right.

Nonvisualized right posterior inferior cerebellar artery and left
anterior inferior cerebellar artery.

No high-grade stenosis of the basilar artery.

High-grade stenosis proximal P2 segment right posterior cerebral
artery. Moderate narrowing distal P2 segment left posterior cerebral
artery. Posterior cerebral artery distal branch vessel narrowing and
irregularity bilaterally.

MRA NECK

Plaque with narrowing proximal right internal carotid artery (less
than 50%). Ectasia of the right internal carotid artery beyond this
region with slight irregularity.

Ectatic left internal carotid artery without significant stenosis.

Mild to moderate narrowing proximal aspect of the vertebral arteries
bilaterally.

Mild narrowing proximal subclavian arteries bilaterally.

## 2017-02-11 ENCOUNTER — Telehealth: Payer: Self-pay | Admitting: Cardiovascular Disease

## 2017-02-11 NOTE — Telephone Encounter (Signed)
Permission granted by patient to speak to granddaughter [name]. Reviewed med list w her w most UTD changes from last OV. She verbalized understanding .  No further questions, aware to call again if we can be of further assistance.

## 2017-02-11 NOTE — Telephone Encounter (Signed)
New message  Patient granddaughter verbalizes she needs to go over patient medications to see what she should take and what she should not take

## 2017-03-05 ENCOUNTER — Other Ambulatory Visit: Payer: Self-pay | Admitting: Internal Medicine

## 2017-03-05 DIAGNOSIS — R911 Solitary pulmonary nodule: Secondary | ICD-10-CM

## 2017-03-09 ENCOUNTER — Ambulatory Visit
Admission: RE | Admit: 2017-03-09 | Discharge: 2017-03-09 | Disposition: A | Discharge status: Home / Self Care | Payer: Medicare Other | Source: Ambulatory Visit | Attending: Internal Medicine | Admitting: Internal Medicine

## 2017-03-09 DIAGNOSIS — R911 Solitary pulmonary nodule: Secondary | ICD-10-CM

## 2017-03-17 ENCOUNTER — Encounter: Payer: Self-pay | Admitting: Neurology

## 2017-03-17 ENCOUNTER — Ambulatory Visit: Payer: Medicare Other | Admitting: Neurology

## 2017-03-17 VITALS — BP 120/74 | HR 74 | Ht 64.0 in | Wt 175.2 lb

## 2017-03-17 DIAGNOSIS — G459 Transient cerebral ischemic attack, unspecified: Secondary | ICD-10-CM

## 2017-03-17 NOTE — Patient Instructions (Signed)
Return to clinic in 9 months 

## 2017-03-17 NOTE — Progress Notes (Signed)
Follow-up Visit   Date: 03/17/17  Maria [name] MRN: [id number] DOB: 03-20-1922   Interim History: Maria [name] is a 84 y.o. right-handed female with hypertension, hyperlipidemia, neuropathy, former tobacco use, and migraines returning to the clinic for follow-up of TIA. The patient was accompanied to the clinic by husband.  History of present illness: She reports having three stereotyped spells of left face and arm numbness since January 2017.  She has numbness around the left cheek, nose and chin and left dorsum of the hand and forearm.  She occasionally with slurred speech.  She does not have headache, vision changes, or limb weakness. She had had three distinct spells in January, June, and August 2017.  Sroke work-up in January 2017 which showed likely peripheral artifact over the parietal-occipital junction, less likely tiny infarct, on my review.  There is moderate intracranial stenosis involving left ACA and and high-grade stenosis of the right proximal P2 and distal left P2.  She was started on aspirin 325mg  daily for secondary prevention.  She was evaluated by the stroke team who felt symptoms were more suggestive of complex migraine.  Since this time, she had two additional spells again with focal left face and arm numbness, lasting < 1 hour.  MRI brain in June and CT head in August did not show acute stroke.    She also has history neuropathy for about the past 5 years manifesting with tingling involving the feet. Symptoms are not bothersome enough to take any medications.  She endorses mild imbalance.   She walks with a cane since she had bilateral hip replacement (2011, 2014).  She had not suffered any falls.  In early 2018, she began having mild memory changes, such as remembering people names.  She is highly independent and has self restricted her driving to her neighborhood only.  She manages her own finances with difficulty, cooks, and cleans her home.  She  stays active and goes to the [facility] daily to walk an hour.  She continues to do her own IADLs and enjoys quilting.   UPDATE 12/14/2016:   She is here for follow-up visit because of frequent ER visits for left sided face and arm numbness, lasting anywhere from 20 min - 4 hours.  Her blood pressure has always been elevated with these spells (SBP 190-200s).  She had extensive TIA/stroke evaluation in September 2018 which was unrevealing.  She was started on dual antiplatelet therapy with aspirin 81mg  and plavix 75mg .  she was also started on amlodipine 2.5 mg which has improved her blood pressure and she has not had any further spells since starting this medication.  She denies any headache, left face/arm weakness, or loss of consciousness with these events.    UPDATE 03/17/2017:  She is here follow-up visit.  She has not had any further spells of left sided paresthesias.  Her cardiac monitor showed atrial fibrillation and she has since been started on Eliquis and taken off antiplatelet therapy.  She does not have any new neurological complaints and is mostly bothered by left leg swelling and shortness of breath with exertion.  She has mentioned these symptoms to her PCP and will be seeing her cardiologist next month.    Medications:  Current Outpatient Medications on File Prior to Visit  Medication Sig Dispense Refill  . amLODipine (NORVASC) 5 MG tablet Take 1 tablet (5 mg total) by mouth daily. 30 tablet 6  . apixaban (ELIQUIS) 5 MG TABS tablet Take 1 tablet (  5 mg total) by mouth 2 (two) times daily. 60 tablet 6  . atorvastatin (LIPITOR) 20 MG tablet Take 1 tablet (20 mg total) by mouth at bedtime. 30 tablet 4  . Cholecalciferol (VITAMIN D3) 2000 UNITS TABS Take 2,000 Units by mouth daily.    . Cyanocobalamin (VITAMIN B-12 IJ) Inject 1,000 mcg as directed every 30 (thirty) days.    . metoprolol succinate (TOPROL-XL) 25 MG 24 hr tablet Take 1 tablet (25 mg total) by mouth daily with breakfast. 30 tablet 0    . Multiple Vitamins-Minerals (PRESERVISION AREDS PO) Take 1 tablet by mouth 2 (two) times daily.    Marland Kitchen triamcinolone cream (KENALOG) 0.5 % Apply 1 application topically 2 (two) times daily as needed (rash).      No current facility-administered medications on file prior to visit.     Allergies:  Allergies  Allergen Reactions  . Codeine Nausea And Vomiting  . Tylenol [Acetaminophen]     ITCHING  . Ibuprofen Itching and Rash    Review of Systems:  CONSTITUTIONAL: No fevers, chills, night sweats, or weight loss.  EYES: No visual changes or eye pain, no dry mouth ENT: No hearing changes.  No history of nose bleeds.   RESPIRATORY: +cough, wheezing and shortness of breath.   CARDIOVASCULAR: Negative for chest pain, and palpitations.   GI: Negative for abdominal discomfort, blood in stools or black stools.  No recent change in bowel habits.   GU:  No history of incontinence.   MUSCLOSKELETAL: No history of joint pain or swelling.  No myalgias.   SKIN: Negative for lesions, rash, and itching.   ENDOCRINE: Negative for cold or heat intolerance, polydipsia or goiter.   PSYCH:  +depression or anxiety symptoms.   NEURO: As Above.   Vital Signs:  BP 120/74   Pulse 74   Ht 5\' 4"  (1.626 m)   Wt 175 lb 4 oz (79.5 kg)   SpO2 91%   BMI 30.08 kg/m   General:  Well appearing RRR:  Irregularly irregular Pulm:  Clear to auscultation Ext:  3+ pitting edema of the left leg 1+, on the right.  Neurological Exam: MENTAL STATUS including orientation to time, place, person is normal.  Mild word-finding difficulty, no dysarthria.  CRANIAL NERVES:  Pupils are round and reactive.  Extraocular muscles intact.   Face is symmetric. Smile is symmetric.  MOTOR:  Motor strength is 5/5 in all extremities.     SENSORY:  Vibration is intact throughout.   COORDINATION/GAIT:   Gait mildly-wide based, stable unassisted.   Data: September 2018 hospitalization - MRI of the brain showed no acute  intracranial abnormality, chronic microhemorrhages hrough out the cerebral, greater than cerebellar hemispheres, likely increased from 2017 may be secondary to chronic hypertension - MRA showed no large vessel occlusion, new moderate to severe left P2 stenosis, unchanged severe right P2 stenosis, unchanged mild left ICA and right ACA stenosis - LDL 112, Placed on Lipitor - Carotid Dopplers showed 1-39% ICA stenosis - neurology consulted, placed on aspirin 81 mg daily with Plavix - PT recommended home PT follow-up, back to baseline - 2-D echo showed EF of 65-70% with grade 1 diastolic dysfunction, no regional wall motion abnormalities  Cardiac monitoring 01/12/2017:   1. Normal sinus rhythm with PVCs and PACs 2. Atrial fibrillation with controlled ventricular response.  IMPRESSION/PLAN: TIA vs. malignant hypertension causing stereotyped spells of left face and arm numbness in the setting of newly diagnosed atrial fibrillation.  She has had not had  any further spells or new neurological symptoms.  She has known intracranial stenosis involving bilateral P2 stenosis She has been transitioned off dual antiplatelet therapy to Eliquis 5mg  BID, followed by Dr. Simonne Come whose care is appreciated   She remains on lipitor 20mg  for hyperlipidemia.   Blood pressure is much better controlled on medication. She had not had any further hospitalizations for malignant hypertension  Recommend follow-up with PCP regarding shortness of breath and leg edema.    Return to clinic in 9 months  Greater than 50% of this 20 minute visit was spent in counseling, explanation of diagnosis, planning of further management, and coordination of care due to the complexity of her presentation.    Thank you for allowing me to participate in patient's care.  If I can answer any additional questions, I would be pleased to do so.    Sincerely,    Itzayana Pardy K. Posey Pronto, DO

## 2017-05-06 ENCOUNTER — Ambulatory Visit: Payer: Medicare Other | Admitting: Physician Assistant

## 2017-05-06 ENCOUNTER — Encounter: Payer: Self-pay | Admitting: Physician Assistant

## 2017-05-06 VITALS — BP 131/73 | HR 74 | Ht 64.0 in | Wt 176.0 lb

## 2017-05-06 DIAGNOSIS — I5033 Acute on chronic diastolic (congestive) heart failure: Secondary | ICD-10-CM | POA: Diagnosis not present

## 2017-05-06 DIAGNOSIS — I480 Paroxysmal atrial fibrillation: Secondary | ICD-10-CM

## 2017-05-06 DIAGNOSIS — I10 Essential (primary) hypertension: Secondary | ICD-10-CM | POA: Diagnosis not present

## 2017-05-06 MED ORDER — FUROSEMIDE 80 MG PO TABS: 40.0000 mg | ORAL_TABLET | Freq: Every day | ORAL | 0 refills | Status: DC

## 2017-05-06 NOTE — Progress Notes (Signed)
Cardiology Office Note   Date:  05/06/2017   ID:  Maria [name], DOB 1922-10-20, MRN [id number]  PCP:  Jani Gravel, MD  Cardiologist:  Dr Gwenlyn Found, 01/29/2017 Rosaria Ferries, PA-C   Chief Complaint  Patient presents with  . Follow-up    3 month ov, complaining of cough    History of Present Illness: Maria [name] is a 84 y.o. female with a history of HTN, HLD, TIA, remote tob, OA, migraines, Afib dx after TIA>>ASA & Plavix changed to Eliquis, CHA2DS2-VASc Score and unadjusted Ischemic Stroke Rate (% per year) is equal to 7.2 % stroke rate/year from a score of 6 (female, age x 2, HTN, TIA x 2).  Maria [name] presents for cardiology follow-up.   She reports that she never feels symptomatic when she is in atrial fib. She has never had palpitations, no presyncope or syncope. According to her BP records, SBP runs 108-151. Denies any recent chest pain with or without exertion. States that her functional status has remained the same, as her husband go to the [facility] every morning, Monday-Friday. She used to walk around the track, but has recently started on using the stationary bike as her breathing has become more difficult. She is able to complete ADL's on her own.   She has DOE, bendopnea and the LE edema x 4 weeks. She was recently seen by her PCP and started on Lasix 80 mg, 1/2 tab daily, plus HCTZ 25 mg. However, per conversation with pharmacy, prescription reads for 1 tab 80 mg Lasix daily. She reports that her LEE is improving but still up from her baseline. LEE is present when she wakes up and stays consistent throughout the day. She is unsure of how much fluid she is drinking each day, but admits to eating most meals out at restaurants such as [employer], [employer], and [employer].   Maria [name] also admits to having a nagging, dry, deep cough for the past few months that causes her to be hoarse. She has not had any associated pleuritic chest pain, fever, or  chills. She has not tried any new medications, and has not been given anything by her PCP. She states that she suffered a cough while on Lisinopril before, but is not currently on an ACEi.   Past Medical History:  Diagnosis Date  . Allergic urticaria   . Arthritis    OA AND PAIN LEFT HIP  . Glaucoma   . Headache(784.0)    HX OF MIGRAINES  . Hypercholesterolemia   . Hypertension   . Osteoporosis   . Peripheral neuropathy   . PONV (postoperative nausea and vomiting)   . Rib fractures    IN THE PAST  . Shingles 1940'S    Past Surgical History:  Procedure Laterality Date  . ABDOMINAL HYSTERECTOMY  1958  . APPENDECTOMY  1937  . DILATION AND CURETTAGE OF UTERUS     MULTIPLE  . EXTERNAL FIXATOR LEFT ARM FRACTURE 1997    . EYE SURGERY  1998 & 2006   BILATERAL CATARACT EXTRACTION  . JOINT REPLACEMENT  2011   RIGHT TOTAL HIP REPLACEMENT  . TONSILLECTOMY  1934  . TOTAL HIP ARTHROPLASTY  05/27/2011   Procedure: TOTAL HIP ARTHROPLASTY;  Surgeon: Gearlean Alf, MD;  Location: [facility];  Service: Orthopedics;  Laterality: Left;    Current Outpatient Medications  Medication Sig Dispense Refill  . amLODipine (NORVASC) 5 MG tablet Take 1 tablet (5 mg total) by mouth daily. [place]  tablet 6  . apixaban (ELIQUIS) 5 MG TABS tablet Take 1 tablet (5 mg total) by mouth 2 (two) times daily. 60 tablet 6  . atorvastatin (LIPITOR) 20 MG tablet Take 1 tablet (20 mg total) by mouth at bedtime. 30 tablet 4  . Cholecalciferol (VITAMIN D3) 2000 UNITS TABS Take 2,000 Units by mouth daily.    . Cyanocobalamin (B-12) 2500 MCG TABS Take 1 tablet by mouth daily.    . Cyanocobalamin (VITAMIN B-12 IJ) Inject 1,000 mcg as directed every 30 (thirty) days.    . metoprolol succinate (TOPROL-XL) 25 MG 24 hr tablet Take 1 tablet (25 mg total) by mouth daily with breakfast. 30 tablet 0  . Multiple Vitamins-Minerals (PRESERVISION AREDS PO) Take 1 tablet by mouth 2 (two) times daily.    Marland Kitchen triamcinolone cream (KENALOG) 0.1 %  Use as directed  1   No current facility-administered medications for this visit.     Allergies:   Codeine; Tylenol [acetaminophen]; and Ibuprofen    Social History:  The patient  reports that she has quit smoking. She has never used smokeless tobacco. She reports that she does not drink alcohol or use drugs.   Family History:  The patient's family history includes Atrial fibrillation in her son; Heart disease in her mother; Other (age of onset: 42) in her son; Other (age of onset: 30) in her father; Stroke in her brother, mother, and sister.    ROS:  Please see the history of present illness. All other systems are reviewed and negative.    PHYSICAL EXAM: VS:  BP 131/73   Pulse 74   Ht 5\' 4"  (1.626 m)   Wt 176 lb (79.8 kg)   BMI 30.21 kg/m  , BMI Body mass index is 30.21 kg/m. GEN: Well nourished, well developed, female in no acute distress; appearing younger than stated age  73: normal for age  Neck: JVD noted below the angle of the mandible, no carotid bruit, no masses Cardiac: RRR; no murmur, no rubs, or gallops Respiratory:  clear to auscultation bilaterally, normal work of breathing GI: soft, nontender, nondistended, + BS MS: no deformity or atrophy; distal pulses are 2+ in all 4 extremities; trace edema in LE BL with signs of chronic dermatitis  Skin: warm and dry, inflammatory-appearing rash noted on left lateral ankle with underlying erythema  Neuro:  Strength and sensation are intact Psych: euthymic mood, full affect  Recent Labs: 11/07/2016: TSH 4.873 11/26/2016: ALT 19; BUN 31; Creatinine, Ser 1.05; Hemoglobin 12.1; Platelets 187; Potassium 3.6; Sodium 140    Lipid Panel    Component Value Date/Time   CHOL 178 11/07/2016 0616   TRIG 77 11/07/2016 0616   HDL 51 11/07/2016 0616   CHOLHDL 3.5 11/07/2016 0616   VLDL 15 11/07/2016 0616   LDLCALC 112 (H) 11/07/2016 0616     Wt Readings from Last 3 Encounters:  05/06/17 176 lb (79.8 kg)  03/17/17 175 lb 4 oz  (79.5 kg)  01/29/17 174 lb 9.6 oz (79.2 kg)    10/29/2016   Study Conclusions  - Left ventricle: The cavity size was normal. Wall thickness was   increased in a pattern of moderate LVH. Systolic function was   vigorous. The estimated ejection fraction was in the range of 65%   to 70%. Wall motion was normal; there were no regional wall   motion abnormalities. Doppler parameters are consistent with   abnormal left ventricular relaxation (grade 1 diastolic   dysfunction). - Mitral valve: Calcified  annulus. Mildly calcified leaflets .   There was mild regurgitation. Valve area by continuity equation   (using LVOT flow): 2.39 cm^2. - Left atrium: The atrium was moderately dilated. - Atrial septum: There was increased thickness of the septum,   consistent with lipomatous hypertrophy. - Pulmonary arteries: Systolic pressure was mildly increased. PA   peak pressure: 33 mm Hg (S).  ASSESSMENT AND PLAN:  1. Paroxysmal Atrial fibrillation, hx of TIA - patiently currently asymptomatic without CP, lightheadedness, dizziness, palpitations, or syncope   - CHA2DS2-VASc Score, currently on Eliquis 5 mg PO bid - Continue Amlodipine 5 mg PO QD, and Toprol 25 mg QD  2. Chronic diastolic congestive heart failure  - last echo 10/2016: EF 65-70%, G1DD, no regional wall motion abnormalities - trace LEE and mild JVD on physical exam - patient complains of exertional dyspnea/bendopnea, but denies orthopnea or PND  - will discontinue HCTZ and prescribe Lasix 80 mg PO x 3 days, and then 1/2 tablet QD from thereafter  - BMP at today's visit and re-check in one month  - encourage compliance w/ low sodium diet and attention to fluid intake - follow-up with Dr. Gwenlyn Found in one month   2. HTN - Continue medication regimen  - BP today and per patient's home record appears to be stable   3. HLD  - FLP 11/07/17: cholesterol: 178, TG: 77, HDL: 51, LDL: 112 - On Atorvastatin, 20 mg PO QD - managed by  PCP   Current medicines are reviewed at length with the patient today.  The patient does not have concerns regarding medicines.  The following changes have been made: DC HCTZ, increase Lasix  Labs/ tests ordered today include:   Orders Placed This Encounter  Procedures  . Basic Metabolic Panel (BMET)     Disposition:   FU with Dr. Gwenlyn Found  Signed, Rosaria Ferries, PA-C  05/06/2017 5:50 PM    [place] Phone: [phone]; Fax: [phone]  This note was written with the assistance of speech recognition software. Please excuse any transcriptional errors.

## 2017-05-06 NOTE — Patient Instructions (Addendum)
Medication Instructions:  DISCONTINUE HCTZ(Hydrocholothiazide)  START Lasix 80mg  Take 1 tablet once a day for 3 days then half a tab (40mg ) daily   Labwork: Your physician recommends that you return for lab work in: TODAY-BMET  Testing/Procedures: NONE  Follow-Up: Your physician recommends that you schedule a follow-up appointment in: 1 MONTH with Dr Gwenlyn Found or Rosaria Ferries, PA  Any Other Special Instructions Will Be Listed Below (If Applicable). 1. Track your weight daily;   CONTACT OFFICE IF you gain more than 3lbs in a day or 5lbs in a week  2. Only eat 500mg  of sodium per meal or no more than 2000mg  of Sodium per day  3. Only drink 2 quart of liquids daily  If you need a refill on your cardiac medications before your next appointment, please call your pharmacy.

## 2017-05-07 LAB — BASIC METABOLIC PANEL
BUN / CREAT RATIO: 20 (ref 12–28)
BUN: 19 mg/dL (ref 10–36)
CALCIUM: 9 mg/dL (ref 8.7–10.3)
CO2: 25 mmol/L (ref 20–29)
CREATININE: 0.96 mg/dL (ref 0.57–1.00)
Chloride: 103 mmol/L (ref 96–106)
GFR, EST AFRICAN AMERICAN: 59 mL/min/{1.73_m2} — AB (ref >59)
GFR, EST NON AFRICAN AMERICAN: 52 mL/min/{1.73_m2} — AB (ref >59)
Glucose: 136 mg/dL — ABNORMAL HIGH (ref 65–99)
Potassium: 4 mmol/L (ref 3.5–5.2)
Sodium: 143 mmol/L (ref 134–144)

## 2017-06-08 ENCOUNTER — Encounter: Payer: Self-pay | Admitting: Cardiovascular Disease

## 2017-06-08 ENCOUNTER — Ambulatory Visit: Payer: Medicare Other | Admitting: Cardiovascular Disease

## 2017-06-08 DIAGNOSIS — I10 Essential (primary) hypertension: Secondary | ICD-10-CM | POA: Diagnosis not present

## 2017-06-08 DIAGNOSIS — I480 Paroxysmal atrial fibrillation: Secondary | ICD-10-CM

## 2017-06-08 DIAGNOSIS — R609 Edema, unspecified: Secondary | ICD-10-CM | POA: Diagnosis not present

## 2017-06-08 DIAGNOSIS — E7800 Pure hypercholesterolemia, unspecified: Secondary | ICD-10-CM

## 2017-06-08 NOTE — Progress Notes (Signed)
06/08/2017 Maria [name]   05-Apr-1922  [id number]  Primary Physician Jani Gravel, MD Primary Cardiologist: Lorretta Harp MD [name], [name]  HPI:  Maria [name] is a 84 y.o.  mildly overweight married Caucasian female (second marriage) who just celebrated her 59th wedding anniversary. She is a mother of 2 children one of whom is deceased and 2 granddaughters.  She is accompanied by her husband today.  I last saw her in the office on 12/29/2016. She was referred by Dr. Maudie Mercury for cardiovascular evaluation. In the past she's worked as a Network engineer at [employer] is also worked at a Biochemist, clinical and an Metallurgist. She does have a history of hyperlipidemia currently not on statin therapy and hypertension treated with beta blocker. She's never had a heart attack or stroke. She denies chest pain or shortness of breath. She did have to total hip replacement in 2011 and 2013.  She did have several TIAs back in November and had an event monitor that showed PAF for which she was begun on Eliquis oral anticoagulation.  She walks with a cane and is relatively asymptomatic. A 2-D echo and carotid Dopplers were unremarkable. She is otherwise a symptomatic.      Current Meds  Medication Sig  . amLODipine (NORVASC) 5 MG tablet Take 1 tablet (5 mg total) by mouth daily.  Marland Kitchen apixaban (ELIQUIS) 5 MG TABS tablet Take 1 tablet (5 mg total) by mouth 2 (two) times daily.  Marland Kitchen atorvastatin (LIPITOR) 20 MG tablet Take 1 tablet (20 mg total) by mouth at bedtime.  . Cholecalciferol (VITAMIN D3) 2000 UNITS TABS Take 2,000 Units by mouth daily.  . Cyanocobalamin (B-12) 2500 MCG TABS Take 1 tablet by mouth daily.  . Cyanocobalamin (VITAMIN B-12 IJ) Inject 1,000 mcg as directed every 30 (thirty) days.  . furosemide (LASIX) 80 MG tablet Take 0.5 tablets (40 mg total) by mouth daily.  . metoprolol succinate (TOPROL-XL) 25 MG 24 hr tablet Take 1 tablet (25 mg total)  by mouth daily with breakfast.  . Multiple Vitamins-Minerals (PRESERVISION AREDS PO) Take 1 tablet by mouth 2 (two) times daily.  Marland Kitchen triamcinolone cream (KENALOG) 0.1 % Use as directed     Allergies  Allergen Reactions  . Codeine Nausea And Vomiting  . Tylenol [Acetaminophen]     ITCHING  . Ibuprofen Itching and Rash    Social History   Socioeconomic History  . Marital status: Married    Spouse name: Not on file  . Number of children: Not on file  . Years of education: Not on file  . Highest education level: Not on file  Occupational History  . Not on file  Social Needs  . Financial resource strain: Not on file  . Food insecurity:    Worry: Not on file    Inability: Not on file  . Transportation needs:    Medical: Not on file    Non-medical: Not on file  Tobacco Use  . Smoking status: Former Research scientist (life sciences)  . Smokeless tobacco: Never Used  . Tobacco comment: 38 YRS AGO  Substance and Sexual Activity  . Alcohol use: No  . Drug use: No  . Sexual activity: Not on file  Lifestyle  . Physical activity:    Days per week: Not on file    Minutes per session: Not on file  . Stress: Not on file  Relationships  . Social connections:    Talks on phone: Not on  file    Gets together: Not on file    Attends religious service: Not on file    Active member of club or organization: Not on file    Attends meetings of clubs or organizations: Not on file    Relationship status: Not on file  . Intimate partner violence:    Fear of current or ex partner: Not on file    Emotionally abused: Not on file    Physically abused: Not on file    Forced sexual activity: Not on file  Other Topics Concern  . Not on file  Social History Narrative   Lives with husband in a one story home.  They are married for 59 years.  Has 1 son.  Retired from working in Insurance underwriter.  Education: some college.     Review of Systems: General: negative for chills, fever, night sweats or weight changes.    Cardiovascular: negative for chest pain, dyspnea on exertion, edema, orthopnea, palpitations, paroxysmal nocturnal dyspnea or shortness of breath Dermatological: negative for rash Respiratory: negative for cough or wheezing Urologic: negative for hematuria Abdominal: negative for nausea, vomiting, diarrhea, bright red blood per rectum, melena, or hematemesis Neurologic: negative for visual changes, syncope, or dizziness All other systems reviewed and are otherwise negative except as noted above.    Blood pressure 124/65, pulse 88, height 5\' 4"  (1.626 m), weight 175 lb 12.8 oz (79.7 kg).  General appearance: alert and no distress Neck: no adenopathy, no carotid bruit, no JVD, supple, symmetrical, trachea midline and thyroid not enlarged, symmetric, no tenderness/mass/nodules Lungs: clear to auscultation bilaterally Heart: regular rate and rhythm, S1, S2 normal, no murmur, click, rub or gallop Extremities: extremities normal, atraumatic, no cyanosis or edema Pulses: 2+ and symmetric Skin: Skin color, texture, turgor normal. No rashes or lesions Neurologic: Alert and oriented X 3, normal strength and tone. Normal symmetric reflexes. Normal coordination and gait  EKG not performed today  ASSESSMENT AND PLAN:   Hyperlipidemia History of hyperlipidemia on statin therapy with lipid profile performed 11/07/2016 total cholesterol 178, LDL of 112 and HDL of 51.  Essential hypertension History of essential hypertension with blood pressure measured today at 124/65.  She is on metoprolol amlodipine.  Continue current meds at current dosing.  Paroxysmal atrial fibrillation (HCC) History of paroxysmal atrial fibrillation demonstrated by event monitoring with TIAs in the past on Eliquis oral anticoagulation.  Dependent edema History of dependent edema with grade 1 diastolic dysfunction on oral diuretics.  She does admit to eating out most of her meals and probably has an element of dietary  indiscretion with regards to salt in addition to being on amlodipine.      Lorretta Harp MD FACP,FACC,FAHA, [facility] 06/08/2017 4:29 PM

## 2017-06-08 NOTE — Assessment & Plan Note (Signed)
History of paroxysmal atrial fibrillation demonstrated by event monitoring with TIAs in the past on Eliquis oral anticoagulation.

## 2017-06-08 NOTE — Patient Instructions (Signed)
Medication Instructions: Your physician recommends that you continue on your current medications as directed. Please refer to the Current Medication list given to you today.   Follow-Up: We request that you follow-up in: 6 months with Rhonda Barrett, PA and in 12 months with Dr Berry  You will receive a reminder letter in the mail two months in advance. If you don't receive a letter, please call our office to schedule the follow-up appointment.  If you need a refill on your cardiac medications before your next appointment, please call your pharmacy.  

## 2017-06-08 NOTE — Assessment & Plan Note (Signed)
History of hyperlipidemia on statin therapy with lipid profile performed 11/07/2016 total cholesterol 178, LDL of 112 and HDL of 51.

## 2017-06-08 NOTE — Assessment & Plan Note (Signed)
History of dependent edema with grade 1 diastolic dysfunction on oral diuretics.  She does admit to eating out most of her meals and probably has an element of dietary indiscretion with regards to salt in addition to being on amlodipine.

## 2017-06-08 NOTE — Assessment & Plan Note (Signed)
History of essential hypertension with blood pressure measured today at 124/65.  She is on metoprolol amlodipine.  Continue current meds at current dosing.

## 2017-07-26 ENCOUNTER — Other Ambulatory Visit: Payer: Self-pay

## 2017-07-26 MED ORDER — AMLODIPINE BESYLATE 5 MG PO TABS: 5.0000 mg | ORAL_TABLET | Freq: Every day | ORAL | 10 refills | Status: DC

## 2017-07-26 NOTE — Telephone Encounter (Signed)
Rx(s) sent to pharmacy electronically.  

## 2017-07-30 ENCOUNTER — Encounter: Payer: Self-pay | Admitting: Cardiovascular Disease

## 2017-07-30 ENCOUNTER — Ambulatory Visit: Payer: Medicare Other | Admitting: Cardiovascular Disease

## 2017-07-30 VITALS — BP 162/78 | HR 70 | Ht 63.0 in | Wt 172.4 lb

## 2017-07-30 DIAGNOSIS — I480 Paroxysmal atrial fibrillation: Secondary | ICD-10-CM

## 2017-07-30 DIAGNOSIS — I10 Essential (primary) hypertension: Secondary | ICD-10-CM | POA: Diagnosis not present

## 2017-07-30 MED ORDER — AMLODIPINE BESYLATE 5 MG PO TABS: 5.0000 mg | ORAL_TABLET | Freq: Every day | ORAL | 6 refills | Status: DC

## 2017-07-30 MED ORDER — FUROSEMIDE 80 MG PO TABS: 80.0000 mg | ORAL_TABLET | Freq: Every day | ORAL | 1 refills | Status: DC

## 2017-07-30 NOTE — Progress Notes (Signed)
Maria [name] continues to complain of lower extremity edema.  We talked about dietary indiscretion with regards to salt last time she was here on 06/08/2017.  Increase her furosemide 80 mg daily and will check a basic metabolic panel in 10 days.  Her EKG today shows A. fib with controlled ventricular response she is on Eliquis oral anticoagulation.  Lorretta Harp, M.D., [place], [facility], [place] [address] [place], [place]  [id number]  [phone] 07/30/2017 2:00 PM

## 2017-07-30 NOTE — Assessment & Plan Note (Signed)
Maria [name] continues to complain of lower extremity edema.  We talked about dietary indiscretion with regards to salt last time she was here on 06/08/2017.  Increase her furosemide 80 mg daily and will check a basic metabolic panel in 10 days.

## 2017-07-30 NOTE — Patient Instructions (Signed)
Medication Instructions: Increase Furosemide to 80 mg daily.   Labwork: Your physician recommends that you return for lab work in: 7-10 days after increasing furosemide.   Follow-Up: We request that you follow-up in: 6 months with Rosaria Ferries, PA and in 12 months with Dr Andria Rhein will receive a reminder letter in the mail two months in advance. If you don't receive a letter, please call our office to schedule the follow-up appointment.  If you need a refill on your cardiac medications before your next appointment, please call your pharmacy.

## 2017-08-06 ENCOUNTER — Ambulatory Visit: Payer: Medicare Other | Admitting: Neurology

## 2017-08-09 LAB — BASIC METABOLIC PANEL
BUN/Creatinine Ratio: 21 (ref 12–28)
BUN: 22 mg/dL (ref 10–36)
CALCIUM: 9.2 mg/dL (ref 8.7–10.3)
CHLORIDE: 99 mmol/L (ref 96–106)
CO2: 28 mmol/L (ref 20–29)
Creatinine, Ser: 1.07 mg/dL — ABNORMAL HIGH (ref 0.57–1.00)
GFR calc Af Amer: 52 mL/min/{1.73_m2} — ABNORMAL LOW (ref >59)
GFR, EST NON AFRICAN AMERICAN: 45 mL/min/{1.73_m2} — AB (ref >59)
Glucose: 85 mg/dL (ref 65–99)
POTASSIUM: 4.4 mmol/L (ref 3.5–5.2)
Sodium: 143 mmol/L (ref 134–144)

## 2017-10-25 ENCOUNTER — Other Ambulatory Visit: Payer: Self-pay | Admitting: Cardiovascular Disease

## 2017-12-20 ENCOUNTER — Ambulatory Visit: Payer: Medicare Other | Admitting: Neurology

## 2017-12-20 ENCOUNTER — Encounter: Payer: Self-pay | Admitting: Neurology

## 2017-12-20 VITALS — BP 120/80 | HR 89 | Ht 63.0 in | Wt 176.5 lb

## 2017-12-20 DIAGNOSIS — R29818 Other symptoms and signs involving the nervous system: Secondary | ICD-10-CM

## 2017-12-20 DIAGNOSIS — R269 Unspecified abnormalities of gait and mobility: Secondary | ICD-10-CM

## 2017-12-20 NOTE — Progress Notes (Signed)
Follow-up Visit   Date: 12/20/17  Maria [name] MRN: [id number] DOB: [birth year]   Interim History: Maria [name] is a 84 y.o. right-handed female with hypertension, hyperlipidemia, neuropathy, former tobacco use, and migraines returning to the clinic for follow-up of TIA. The patient was accompanied to the clinic by husband.  History of present illness: She reports having three stereotyped spells of left face and arm numbness since January 2017.  She has numbness around the left cheek, nose and chin and left dorsum of the hand and forearm.  She occasionally with slurred speech.  She does not have headache, vision changes, or limb weakness. She had had three distinct spells in January, June, and August 2017.  Sroke work-up in January 2017 which showed likely peripheral artifact over the parietal-occipital junction, less likely tiny infarct, on my review.  There is moderate intracranial stenosis involving left ACA and and high-grade stenosis of the right proximal P2 and distal left P2.  She was started on aspirin 325mg  daily for secondary prevention.  She was evaluated by the stroke team who felt symptoms were more suggestive of complex migraine.  Since this time, she had two additional spells again with focal left face and arm numbness, lasting < 1 hour.  MRI brain in June and CT head in August did not show acute stroke.    She also has history neuropathy for about the past 5 years manifesting with tingling involving the feet. Symptoms are not bothersome enough to take any medications.  She endorses mild imbalance.   She walks with a cane since she had bilateral hip replacement (2011, 2014).  She had not suffered any falls.  In early 2018, she began having mild memory changes, such as remembering people names.  She is highly independent and has self restricted her driving to her neighborhood only.  She manages her own finances with difficulty, cooks, and cleans her home.  She  stays active and goes to [facility] to [facility] an hour.  She continues to do her own IADLs and enjoys quilting.   UPDATE 12/14/2016:   She is here for follow-up visit because of frequent ER visits for left sided face and arm numbness, lasting anywhere from 20 min - 4 hours.  Her blood pressure has always been elevated with these spells (SBP 190-200s).  She had extensive TIA/stroke evaluation in September 2018 which was unrevealing.  She was started on dual antiplatelet therapy with aspirin 81mg  and plavix 75mg .  she was also started on amlodipine 2.5 mg which has improved her blood pressure and she has not had any further spells since starting this medication.    UPDATE 03/17/2017:   She has not had any further spells of left sided paresthesias.  Her cardiac monitor showed atrial fibrillation and she has since been started on Eliquis and taken off antiplatelet therapy.  She does not have any new neurological complaints and is mostly bothered by left leg swelling and shortness of breath with exertion.    UPDATE 12/20/2017:  She is here for follow-up visit and has been doing well.  No new neurological symptoms, specifically no left sided numbness/tingling. She is having increased problems with balance and uses a cane for long distances, so is requesting handicap placard for parking.  Fortunately, she has not suffered any falls. She remains highly independent and continues to drive, manage finances, and cleans her home.  She has been cooking less often and tend to eat out.  She and  her husband are going to [facility] tomorrow to watch a few Christmas shows.    Medications:  Current Outpatient Medications on File Prior to Visit  Medication Sig Dispense Refill  . amLODipine (NORVASC) 5 MG tablet Take 1 tablet (5 mg total) by mouth daily. 30 tablet 6  . atorvastatin (LIPITOR) 20 MG tablet Take 1 tablet (20 mg total) by mouth at bedtime. 30 tablet 4  . Cholecalciferol (VITAMIN D3) 2000 UNITS TABS Take 2,000  Units by mouth daily.    . Cyanocobalamin (B-12) 2500 MCG TABS Take 1 tablet by mouth daily.    . Cyanocobalamin (VITAMIN B-12 IJ) Inject 1,000 mcg as directed every 30 (thirty) days.    Marland Kitchen ELIQUIS 5 MG TABS tablet TAKE 1 TABLET BY MOUTH TWICE DAILY 180 tablet 1  . metoprolol succinate (TOPROL-XL) 25 MG 24 hr tablet Take 1 tablet (25 mg total) by mouth daily with breakfast. 30 tablet 0  . Multiple Vitamins-Minerals (PRESERVISION AREDS PO) Take 1 tablet by mouth 2 (two) times daily.    Marland Kitchen triamcinolone cream (KENALOG) 0.1 % Use as directed  1  . furosemide (LASIX) 80 MG tablet Take 1 tablet (80 mg total) by mouth daily. 90 tablet 1   No current facility-administered medications on file prior to visit.     Allergies:  Allergies  Allergen Reactions  . Codeine Nausea And Vomiting  . Tylenol [Acetaminophen]     ITCHING  . Ibuprofen Itching and Rash    Review of Systems:  CONSTITUTIONAL: No fevers, chills, night sweats, or weight loss.  EYES: No visual changes or eye pain, no dry mouth ENT: No hearing changes.  No history of nose bleeds.   RESPIRATORY: +cough, wheezing and shortness of breath.   CARDIOVASCULAR: Negative for chest pain, and palpitations.   GI: Negative for abdominal discomfort, blood in stools or black stools.  No recent change in bowel habits.   GU:  No history of incontinence.   MUSCLOSKELETAL: No history of joint pain or swelling.  No myalgias.   SKIN: Negative for lesions, rash, and itching.   ENDOCRINE: Negative for cold or heat intolerance, polydipsia or goiter.   PSYCH:  +depression or anxiety symptoms.   NEURO: As Above.   Vital Signs:  BP 120/80   Pulse 89   Ht 5\' 3"  (1.6 m)   Wt 176 lb 8 oz (80.1 kg)   SpO2 93%   BMI 31.27 kg/m   General Medical Exam:   General:  Well appearing, comfortable  Eyes/ENT: see cranial nerve examination.   Neck: No masses appreciated.  Full range of motion without tenderness.  No carotid bruits. Respiratory:  Clear to  auscultation, good air entry bilaterally.   Cardiac:  irregularly irregulra   Ext:  Bilateral 1+ pitting edema  Neurological Exam: MENTAL STATUS including orientation to time, place, person is normal.  Mild word-finding difficulty, no dysarthria.  CRANIAL NERVES:  Pupils are round and reactive.  Extraocular muscles intact.   Face is symmetric. Smile is symmetric.  MOTOR:  Motor strength is 5/5 in all extremities.     SENSORY:  Vibration is intact throughout.   COORDINATION/GAIT:   Gait mildly-wide based, stable assisted with cane.   Data: September 2018 hospitalization - MRI of the brain showed no acute intracranial abnormality, chronic microhemorrhages hrough out the cerebral, greater than cerebellar hemispheres, likely increased from 2017 may be secondary to chronic hypertension - MRA showed no large vessel occlusion, new moderate to severe left P2 stenosis, unchanged  severe right P2 stenosis, unchanged mild left ICA and right ACA stenosis - LDL 112, Placed on Lipitor - Carotid Dopplers showed 1-39% ICA stenosis - neurology consulted, placed on aspirin 81 mg daily with Plavix - PT recommended home PT follow-up, back to baseline - 2-D echo showed EF of 65-70% with grade 1 diastolic dysfunction, no regional wall motion abnormalities  Cardiac monitoring 01/12/2017:   1. Normal sinus rhythm with PVCs and PACs 2. Atrial fibrillation with controlled ventricular response.  IMPRESSION/PLAN: TIA vs. malignant hypertension causing stereotyped spells of left face and arm numbness in the setting of newly diagnosed atrial fibrillation (2017).  She has had not had any further spells or new neurological symptoms and clinically doing great. She remains on Eliquis 5mg  BID, followed by Dr. Simonne Come.  Blood pressure is well-controlled.  She is having imbalance, due to combination of degenerative changes, age-associated sensory loss, and advanced age.  She uses a cane for ambulation which provides  adequate support.  I have given her a handicap placard.  Fall precautions discussed.   Return to clinic as needed.    Thank you for allowing me to participate in patient's care.  If I can answer any additional questions, I would be pleased to do so.    Sincerely,    Stoney Karczewski K. Posey Pronto, DO

## 2018-01-02 NOTE — Progress Notes (Signed)
Cardiology Office Note   Date:  01/03/2018   ID:  Maria [name], DOB Jun 22, 1922, MRN [id number]  PCP:  Jani Gravel, MD Cardiologist:  Quay Burow, MD 06/08/2017 Phone note 07/30/2017 Rosaria Ferries, PA-C 05/06/2017  No chief complaint on file.   History of Present Illness: Maria [name] is a 84 y.o. female with a history of HTN, HLD, TIA, remote tob, OA, migraines, Afib dx after TIA>>ASA & Plavix changed to Eliquis, CHA2DS2-VASc Score and unadjusted Ischemic Stroke Rate (% per year) is equal to7.2 % stroke rate/year from a score of 6 (female, age x 2, HTN, TIA x 2), echo w/ nl EF and grade 1 dd, migraines  Maria [name] presents for cardiology follow up.  She was exercising till last week. She stopped exercising to work inside the house and prep for the holidays.  She does not wake w/ LE edema, gets it during the day. She denies orthopnea or PND, has nocturia once per night. No change in weight.   She has itching in her legs all the time. It has been going on at times for 10 years. She had a prescription lotion, now uses OTC steroid cream.   She does not wish to pursue med changes to see if this is a drug reaction, it has been going on a long time without any changes.   She has not had any more mini-strokes since being on the Eliquis. She thinks that is making her hoarse and making her cough.   No bleeding issues on the Eliquis.  Her HR is low, she is not really aware of that, but has been tired lately.  She is not aware of the irregular HR, did not know she was in Afib today.   Past Medical History:  Diagnosis Date  . Allergic urticaria   . Arthritis    OA AND PAIN LEFT HIP  . Glaucoma   . Headache(784.0)    HX OF MIGRAINES  . Hypercholesterolemia   . Hypertension   . Osteoporosis   . Peripheral neuropathy   . PONV (postoperative nausea and vomiting)   . Rib fractures    IN THE PAST  . Shingles 1940'S    Past Surgical History:    Procedure Laterality Date  . ABDOMINAL HYSTERECTOMY  1958  . APPENDECTOMY  1937  . DILATION AND CURETTAGE OF UTERUS     MULTIPLE  . EXTERNAL FIXATOR LEFT ARM FRACTURE 1997    . EYE SURGERY  1998 & 2006   BILATERAL CATARACT EXTRACTION  . JOINT REPLACEMENT  2011   RIGHT TOTAL HIP REPLACEMENT  . TONSILLECTOMY  1934  . TOTAL HIP ARTHROPLASTY  05/27/2011   Procedure: TOTAL HIP ARTHROPLASTY;  Surgeon: Gearlean Alf, MD;  Location: [facility];  Service: Orthopedics;  Laterality: Left;    Current Outpatient Medications  Medication Sig Dispense Refill  . amLODipine (NORVASC) 5 MG tablet Take 1 tablet (5 mg total) by mouth daily. 30 tablet 6  . atorvastatin (LIPITOR) 20 MG tablet Take 1 tablet (20 mg total) by mouth at bedtime. 30 tablet 4  . Cholecalciferol (VITAMIN D3) 2000 UNITS TABS Take 2,000 Units by mouth daily.    . Cyanocobalamin (B-12) 2500 MCG TABS Take 1 tablet by mouth daily.    Marland Kitchen ELIQUIS 5 MG TABS tablet TAKE 1 TABLET BY MOUTH TWICE DAILY 180 tablet 1  . furosemide (LASIX) 80 MG tablet Take 1 tablet (80 mg total) by mouth daily. 90 tablet 1  .  metoprolol succinate (TOPROL-XL) 25 MG 24 hr tablet Take 1 tablet (25 mg total) by mouth daily with breakfast. 30 tablet 0  . Multiple Vitamins-Minerals (PRESERVISION AREDS PO) Take 1 tablet by mouth 2 (two) times daily.    Marland Kitchen triamcinolone cream (KENALOG) 0.1 % Use as directed  1   No current facility-administered medications for this visit.     Allergies:   Codeine; Tylenol [acetaminophen]; and Ibuprofen    Social History:  The patient  reports that she has quit smoking. She has never used smokeless tobacco. She reports that she does not drink alcohol or use drugs.   Family History:  The patient's family history includes Atrial fibrillation in her son; Heart disease in her mother; Other (age of onset: 90) in her son; Other (age of onset: 60) in her father; Stroke in her brother, mother, and sister.  She indicated that her mother is  deceased. She indicated that her father is deceased. She indicated that her sister is deceased. She indicated that her brother is deceased. She indicated that only one of her two sons is alive.   ROS:  Please see the history of present illness. All other systems are reviewed and negative.    PHYSICAL EXAM: VS:  BP 126/70   Pulse (!) 45   Ht 5\' 3"  (1.6 m)   Wt 172 lb (78 kg)   SpO2 96%   BMI 30.47 kg/m  , BMI Body mass index is 30.47 kg/m. GEN: Well nourished, well developed, female in no acute distress HEENT: normal for age  Neck: no JVD, no carotid bruit, no masses Cardiac: Irregular rate and rhythm, soft murmur, no rubs, or gallops Respiratory:  clear to auscultation bilaterally, normal work of breathing GI: soft, nontender, nondistended, + BS MS: no deformity or atrophy; trace-1+ lower extremity edema; distal pulses are 2+ in all 4 extremities  Skin: warm and dry, no rash Neuro:  Strength and sensation are intact Psych: euthymic mood, full affect   EKG:  EKG is not ordered today.  ECHO: 12/17/2016 - Left ventricle: The cavity size was normal. Wall thickness was   increased in a pattern of moderate LVH. Systolic function was   vigorous. The estimated ejection fraction was in the range of 65%   to 70%. Wall motion was normal; there were no regional wall   motion abnormalities. Doppler parameters are consistent with   abnormal left ventricular relaxation (grade 1 diastolic   dysfunction). - Mitral valve: Calcified annulus. Mildly calcified leaflets .   There was mild regurgitation. Valve area by continuity equation   (using LVOT flow): 2.39 cm^2. - Left atrium: The atrium was moderately dilated. - Atrial septum: There was increased thickness of the septum,   consistent with lipomatous hypertrophy. - Pulmonary arteries: Systolic pressure was mildly increased. PA   peak pressure: 33 mm Hg (S).  Recent Labs: 08/09/2017: BUN 22; Creatinine, Ser 1.07; Potassium 4.4; Sodium 143   CBC    Component Value Date/Time   WBC 3.7 (L) 11/26/2016 0532   RBC 4.13 11/26/2016 0532   HGB 12.1 11/26/2016 0532   HCT 38.1 11/26/2016 0532   HCT 36.6 11/07/2016 0615   PLT 187 11/26/2016 0532   MCV 92.3 11/26/2016 0532   MCH 29.3 11/26/2016 0532   MCHC 31.8 11/26/2016 0532   RDW 14.0 11/26/2016 0532   LYMPHSABS 1.1 11/25/2016 2028   MONOABS 0.5 11/25/2016 2028   EOSABS 0.3 11/25/2016 2028   BASOSABS 0.0 11/25/2016 2028   CMP Latest  Ref Rng & Units 08/09/2017 05/06/2017 11/26/2016  Glucose 65 - 99 mg/dL 85 136(H) 111(H)  BUN 10 - 36 mg/dL 22 19 31(H)  Creatinine 0.57 - 1.00 mg/dL 1.07(H) 0.96 1.05(H)  Sodium 134 - 144 mmol/L 143 143 140  Potassium 3.5 - 5.2 mmol/L 4.4 4.0 3.6  Chloride 96 - 106 mmol/L 99 103 103  CO2 20 - 29 mmol/L 28 25 28   Calcium 8.7 - 10.3 mg/dL 9.2 9.0 9.0  Total Protein 6.5 - 8.1 g/dL - - 6.1(L)  Total Bilirubin 0.3 - 1.2 mg/dL - - 0.5  Alkaline Phos 38 - 126 U/L - - 83  AST 15 - 41 U/L - - 23  ALT 14 - 54 U/L - - 19     Lipid Panel Lab Results  Component Value Date   CHOL 178 11/07/2016   HDL 51 11/07/2016   LDLCALC 112 (H) 11/07/2016   TRIG 77 11/07/2016   CHOLHDL 3.5 11/07/2016      Wt Readings from Last 3 Encounters:  01/03/18 172 lb (78 kg)  12/20/17 176 lb 8 oz (80.1 kg)  07/30/17 172 lb 6.4 oz (78.2 kg)     Other studies Reviewed: Additional studies/ records that were reviewed today include: Office notes, hospital records and testing.  ASSESSMENT AND PLAN:  1.  Persistent atrial fibrillation: -She is not aware of the atrial fibrillation, but has been fatigued lately - Her heart rate is low when checked manually and with the vital signs. - She does not have any rapid palpitations - Stop the metoprolol for now and see if her heart rate remains controlled or if she has any symptoms related to the atrial fibrillation  2.  Chronic anticoagulation: -She is compliant with the Eliquis twice daily and is not having any bleeding  issues  3.  Chronic diastolic CHF: - She is not really watching the sodium in her diet that carefully.  However, she has not gained any weight and does not have significant volume overload by exam - Her renal function is followed by her PCP and has been stable on the current dose of Lasix -No med changes  4.  Hypertension: -Good blood pressure control on current therapy, I think that her blood pressure will remain stable off the metoprolol.   Current medicines are reviewed at length with the patient today.  The patient has concerns regarding medicines.  Concerns were addressed  The following changes have been made: Stop the metoprolol  Labs/ tests ordered today include:  No orders of the defined types were placed in this encounter.    Disposition:   FU with Quay Burow, MD  Signed, Rosaria Ferries, PA-C  01/03/2018 1:35 PM    [place] Phone: [phone]; Fax: [phone]

## 2018-01-03 ENCOUNTER — Encounter: Payer: Self-pay | Admitting: Physician Assistant

## 2018-01-03 ENCOUNTER — Ambulatory Visit: Payer: Medicare Other | Admitting: Physician Assistant

## 2018-01-03 VITALS — BP 126/70 | HR 45 | Ht 63.0 in | Wt 172.0 lb

## 2018-01-03 DIAGNOSIS — I4819 Other persistent atrial fibrillation: Secondary | ICD-10-CM

## 2018-01-03 DIAGNOSIS — I5032 Chronic diastolic (congestive) heart failure: Secondary | ICD-10-CM | POA: Diagnosis not present

## 2018-01-03 DIAGNOSIS — I10 Essential (primary) hypertension: Secondary | ICD-10-CM | POA: Diagnosis not present

## 2018-01-03 NOTE — Patient Instructions (Signed)
Medication Instructions:  STOP Metoprolol  If you need a refill on your cardiac medications before your next appointment, please call your pharmacy.   Lab work: None  If you have labs (blood work) drawn today and your tests are completely normal, you will receive your results only by: Marland Kitchen MyChart Message (if you have MyChart) OR . A paper copy in the mail If you have any lab test that is abnormal or we need to change your treatment, we will call you to review the results.  Testing/Procedures: None   Follow-Up: At [facility], you and your health needs are our priority.  As part of our continuing mission to provide you with exceptional heart care, we have created designated Provider Care Teams.  These Care Teams include your primary Cardiologist (physician) and Advanced Practice Providers (APPs -  Physician Assistants and Nurse Practitioners) who all work together to provide you with the care you need, when you need it. You will need a follow up appointment in 6 months.  Please call our office 2 months in advance to schedule this appointment.  You may see Quay Burow, MD or one of the following Advanced Practice Providers on your designated Care Team:   Kerin Ransom, PA-C Roby Lofts, [place] . Sande Rives, PA-C  Any Other Special Instructions Will Be Listed Below (If Applicable). TRACK YOUR BLOOD PRESSURE AND HEART RATE GOAL BLOOD PRESSURE IS 130/80 AND GOAL HEART RATE IS BETWEEN 50-100

## 2018-03-28 ENCOUNTER — Other Ambulatory Visit: Payer: Self-pay | Admitting: Cardiovascular Disease

## 2018-04-25 ENCOUNTER — Other Ambulatory Visit: Payer: Self-pay | Admitting: Cardiovascular Disease

## 2018-04-25 NOTE — Telephone Encounter (Signed)
Pt is a 84 yr old female who saw PA on 01/03/18, weight at that visit was 78Kg. SCr on 08/09/17 was 1.07. Will refill her Eliquis 5mg  BID.

## 2018-06-10 ENCOUNTER — Ambulatory Visit: Payer: Medicare Other | Admitting: Gastroenterology

## 2018-06-14 ENCOUNTER — Encounter: Payer: Self-pay | Admitting: General Surgery

## 2018-06-15 ENCOUNTER — Ambulatory Visit (INDEPENDENT_AMBULATORY_CARE_PROVIDER_SITE_OTHER): Payer: Medicare Other | Admitting: Gastroenterology

## 2018-06-15 ENCOUNTER — Encounter: Payer: Self-pay | Admitting: Gastroenterology

## 2018-06-15 ENCOUNTER — Telehealth: Payer: Self-pay

## 2018-06-15 ENCOUNTER — Other Ambulatory Visit: Payer: Self-pay

## 2018-06-15 VITALS — Ht 63.0 in | Wt 160.0 lb

## 2018-06-15 DIAGNOSIS — Z7901 Long term (current) use of anticoagulants: Secondary | ICD-10-CM

## 2018-06-15 DIAGNOSIS — K921 Melena: Secondary | ICD-10-CM

## 2018-06-15 DIAGNOSIS — D509 Iron deficiency anemia, unspecified: Secondary | ICD-10-CM | POA: Diagnosis not present

## 2018-06-15 NOTE — Telephone Encounter (Signed)
Patient with diagnosis of Afib on Eliquis for anticoagulation.    Procedure: Endoscopy Date of procedure: TBD  CHADS2-VASc score of  6 (CHF, HTN, AGE, DM2, stroke/tia x 2, CAD, AGE, female)  CrCl 31 ml/min  Per office protocol, patient can hold Eliquis for 1 day prior to procedure.

## 2018-06-15 NOTE — Telephone Encounter (Signed)
   Primary Cardiologist: Quay Burow, MD  Chart reviewed as part of pre-operative protocol coverage. Per pharmacy recommendations, patient may hold eliquis for 1 day prior to her endoscopic procedure. Patient should restart his medication when cleared to do so by her gastroenterologist.   I will route this recommendation to the requesting party via [facility] fax function and remove from pre-op pool.  Please call with questions.  Abigail Butts, PA-C 06/15/2018, 4:08 PM

## 2018-06-15 NOTE — Telephone Encounter (Signed)
[facility] Pre-operative Risk Assessment     Request for surgical clearance:     Endoscopy Procedure  What type of surgery is being performed?     EGD/Colon  When is this surgery scheduled?     TBA in June  What type of clearance is required ?   Pharmacy  Are there any medications that need to be held prior to surgery and how long? Eliquis  Practice name and name of physician performing surgery?      [facility]  What is your office phone and fax number?      Phone- [phone]  [phone]  Anesthesia type (None, local, MAC, general) ?       MAC

## 2018-06-15 NOTE — Progress Notes (Signed)
History of Present Illness: This is a 85 year old female referred by Jani Gravel, MD for the evaluation of rectal bleeding and iron deficiency anemia.  Her daughter-in-law accompanied her on this telemedicine service.  The patient relates an acute illness in late March or early April with a sudden onset of diarrhea with multiple bowel movements followed by several episodes of nausea and vomiting.  She thought she had food poisoning after eating takeout food.  The symptoms resolved rapidly however afterwards she noted bright red blood on wiping with most bowel movements for the past several weeks up until a few days ago when bleeding stopped.  She has no other gastrointestinal complaints.  She has not previously had colonoscopy.  Records supplied by Dr. Maudie Mercury show a persistent, stable anemia over the past several months with hemoglobin=10.9 in March 2020.  Iron studies performed in November 2019 show iron saturation of 7% with an elevated iron binding capacity and a low normal ferritin of 16. Denies weight loss, abdominal pain, constipation, change in stool caliber, melena, dysphagia, reflux symptoms, chest pain.    Allergies  Allergen Reactions  . Codeine Nausea And Vomiting  . Tylenol [Acetaminophen]     ITCHING  . Ibuprofen Itching and Rash   Outpatient Medications Prior to Visit  Medication Sig Dispense Refill  . amLODipine (NORVASC) 5 MG tablet Take 1 tablet (5 mg total) by mouth daily. 30 tablet 6  . atorvastatin (LIPITOR) 20 MG tablet Take 1 tablet (20 mg total) by mouth at bedtime. 30 tablet 4  . Cholecalciferol (VITAMIN D3) 2000 UNITS TABS Take 2,000 Units by mouth daily.    . Cyanocobalamin (B-12) 2500 MCG TABS Take 1 tablet by mouth daily.    Marland Kitchen ELIQUIS 5 MG TABS tablet Take 1 tablet by mouth twice daily 180 tablet 0  . furosemide (LASIX) 80 MG tablet TAKE 1 TABLET BY MOUTH ONCE DAILY 90 tablet 0  . Multiple Vitamins-Minerals (PRESERVISION AREDS PO) Take 1 tablet by mouth 2 (two) times  daily.    Marland Kitchen triamcinolone cream (KENALOG) 0.1 % Use as directed  1   No facility-administered medications prior to visit.    Past Medical History:  Diagnosis Date  . Allergic urticaria   . Arthritis    OA AND PAIN LEFT HIP  . Glaucoma   . Headache(784.0)    HX OF MIGRAINES  . Hypercholesterolemia   . Hypertension   . Osteoporosis   . Paroxysmal atrial fibrillation (HCC)   . Peripheral neuropathy   . PONV (postoperative nausea and vomiting)   . Rib fractures    IN THE PAST  . Shingles 1940'S  . TIA (transient ischemic attack)    Past Surgical History:  Procedure Laterality Date  . ABDOMINAL HYSTERECTOMY  1958  . APPENDECTOMY  1937  . DILATION AND CURETTAGE OF UTERUS     MULTIPLE  . EXTERNAL FIXATOR LEFT ARM FRACTURE 1997    . EYE SURGERY  1998 & 2006   BILATERAL CATARACT EXTRACTION  . JOINT REPLACEMENT  2011   RIGHT TOTAL HIP REPLACEMENT  . TONSILLECTOMY  1934  . TOTAL HIP ARTHROPLASTY  05/27/2011   Procedure: TOTAL HIP ARTHROPLASTY;  Surgeon: Gearlean Alf, MD;  Location: [facility];  Service: Orthopedics;  Laterality: Left;   Social History   Socioeconomic History  . Marital status: Married    Spouse name: Not on file  . Number of children: 1  . Years of education: Not on file  . Highest education level:  Some college, no degree  Occupational History  . Occupation: retired  Scientific laboratory technician  . Financial resource strain: Not on file  . Food insecurity:    Worry: Not on file    Inability: Not on file  . Transportation needs:    Medical: Not on file    Non-medical: Not on file  Tobacco Use  . Smoking status: Former Research scientist (life sciences)  . Smokeless tobacco: Never Used  . Tobacco comment: 60 YRS AGO  Substance and Sexual Activity  . Alcohol use: No  . Drug use: No  . Sexual activity: Not on file  Lifestyle  . Physical activity:    Days per week: Not on file    Minutes per session: Not on file  . Stress: Not on file  Relationships  . Social connections:    Talks on  phone: Not on file    Gets together: Not on file    Attends religious service: Not on file    Active member of club or organization: Not on file    Attends meetings of clubs or organizations: Not on file    Relationship status: Not on file  Other Topics Concern  . Not on file  Social History Narrative   Lives with husband in a one story home.  They are married for 59 years.  Has 1 son.  Retired from working in Insurance underwriter.  Education: some college.   Family History  Problem Relation Age of Onset  . Heart disease Mother   . Stroke Mother   . Other Father 66       MVA  . Stroke Sister   . Stroke Brother   . Atrial fibrillation Son   . Other Son 12       fire      Review of Systems: Pertinent positive and negative review of systems were noted in the above HPI section. All other review of systems were otherwise negative.    Physical Exam: Telemedicine - not performed    Assessment and Recommendations:  1.  Persistent hematochezia and iron deficiency anemia.  Rule out hemorrhoids, colorectal neoplasms, AVMs, ulcer and other disorders.  Schedule colonoscopy and EGD. The risks (including bleeding, perforation, infection, missed lesions, medication reactions and possible hospitalization or surgery if complications occur), benefits, and alternatives to endoscopy with possible biopsy and possible dilation were discussed with the patient and they consent to proceed. The risks (including bleeding, perforation, infection, missed lesions, medication reactions and possible hospitalization or surgery if complications occur), benefits, and alternatives to colonoscopy with possible biopsy and possible polypectomy were discussed with the patient and they consent to proceed.   2. Hold Eliquis 2 days before procedure - will instruct when and how to resume after procedure. Low but real risk of cardiovascular event such as heart attack, stroke, embolism, thrombosis or ischemia/infarct of other organs  off Eliquis explained and need to seek urgent help if this occurs. The patient consents to proceed. Will communicate by phone or EMR with patient's prescribing provider to confirm that holding Eliquis is reasonable in this case.     These services were provided via telemedicine, audio only per patient request.  The patient was at home with her daughter-in-law and the provider was in the office, alone.  We discussed the limitations of evaluation and management by telemedicine and the availability of in person appointments.  Patient consented for this telemedicine visit and is aware of possible charges for this service.  The other person participating in the telemedicine  service was Marlon Pel, [place] who reviewed medications, allergies, past history and completed AVS.  Time spent on call: 15 minutes     cc: Jani Gravel, MD [place], [place] [address]

## 2018-06-15 NOTE — Patient Instructions (Signed)
We will contact you when our schedule becomes available for June to schedule your EGD/Colonoscopy.  Thank you for choosing me and [facility].  Pricilla Riffle. Dagoberto Ligas., MD., [facility]

## 2018-06-21 NOTE — Telephone Encounter (Signed)
Yes, ok 

## 2018-06-21 NOTE — Telephone Encounter (Signed)
Is one day hold ok Dr. Stark? 

## 2018-06-26 ENCOUNTER — Other Ambulatory Visit: Payer: Self-pay | Admitting: Cardiovascular Disease

## 2018-07-01 ENCOUNTER — Other Ambulatory Visit: Payer: Self-pay

## 2018-07-01 ENCOUNTER — Telehealth: Payer: Self-pay | Admitting: Cardiovascular Disease

## 2018-07-01 ENCOUNTER — Telehealth: Payer: Self-pay | Admitting: Gastroenterology

## 2018-07-01 ENCOUNTER — Encounter: Payer: Self-pay | Admitting: Gastroenterology

## 2018-07-01 ENCOUNTER — Ambulatory Visit (AMBULATORY_SURGERY_CENTER): Payer: Self-pay

## 2018-07-01 VITALS — Ht 63.0 in | Wt 170.0 lb

## 2018-07-01 DIAGNOSIS — D509 Iron deficiency anemia, unspecified: Secondary | ICD-10-CM

## 2018-07-01 DIAGNOSIS — K921 Melena: Secondary | ICD-10-CM

## 2018-07-01 MED ORDER — NA SULFATE-K SULFATE-MG SULF 17.5-3.13-1.6 GM/177ML PO SOLN: 1.0000 | Freq: Once | ORAL | 0 refills | Status: AC

## 2018-07-01 NOTE — Telephone Encounter (Signed)
Pt has questions regarding prep, pls call her.

## 2018-07-01 NOTE — Progress Notes (Signed)
Denies allergies to eggs or soy products. Denies complication of anesthesia or sedation. Denies use of weight loss medication. Denies use of O2.   Emmi instructions declined.   Patient was scheduled for Pre-Visit by phone but she was confused and came in for her appointment. I conducted PV in the office. Patient was encouraged to call if she had any questions or concerns.

## 2018-07-01 NOTE — Telephone Encounter (Signed)
Smartphone pre-reg complete-- Pt has iphone, is uncertain about video calling. Says she will ask granddaughter to help her--MyChart active, verbal consent given 07/01/2018 MS

## 2018-07-01 NOTE — Telephone Encounter (Signed)
Returned patients call to answer questions about the prep. Patient verbalizes understanding.   Riki Sheer, LPN ( PV )

## 2018-07-05 ENCOUNTER — Telehealth (INDEPENDENT_AMBULATORY_CARE_PROVIDER_SITE_OTHER): Payer: Medicare Other | Admitting: Cardiovascular Disease

## 2018-07-05 DIAGNOSIS — I10 Essential (primary) hypertension: Secondary | ICD-10-CM

## 2018-07-05 DIAGNOSIS — I480 Paroxysmal atrial fibrillation: Secondary | ICD-10-CM

## 2018-07-05 DIAGNOSIS — G459 Transient cerebral ischemic attack, unspecified: Secondary | ICD-10-CM

## 2018-07-05 DIAGNOSIS — E782 Mixed hyperlipidemia: Secondary | ICD-10-CM

## 2018-07-05 NOTE — Progress Notes (Signed)
Virtual Visit via Video Note   This visit type was conducted due to national recommendations for restrictions regarding the COVID-19 Pandemic (e.g. social distancing) in an effort to limit this patient's exposure and mitigate transmission in our community.  Due to her co-morbid illnesses, this patient is at least at moderate risk for complications without adequate follow up.  This format is felt to be most appropriate for this patient at this time.  All issues noted in this document were discussed and addressed.  A limited physical exam was performed with this format.  Please refer to the patient's chart for her consent to telehealth for [facility].   Date:  07/05/2018   ID:  Maria [name], DOB 24-Jan-1923, MRN [id number]  Patient Location: Home Provider Location: Office  PCP:  Jani Gravel, MD  Cardiologist:  Quay Burow, MD  Electrophysiologist:  None   Evaluation Performed:  Follow-Up Visit  Chief Complaint: Follow-up hypertension and PAF  History of Present Illness:    JAEDEN WESTBAY is a 85 y.o.  mildly overweight married Caucasian female (second marriage) who just celebrated her 61st wedding anniversary. She is a mother of 2 children one of whom is deceased and 2 granddaughters.  She is accompanied by her husband today.  I last saw her in the office on 07/30/17.She was referred by Dr. Maudie Mercury for cardiovascular evaluation. In the past she's worked as a Network engineer at [employer] is also worked at a Biochemist, clinical and an Metallurgist. She does have a history of hyperlipidemia currently not on statin therapy and hypertension treated with beta blocker. She's never had a heart attack or stroke. She denies chest pain or shortness of breath. She did have to total hip replacement in 2011 and 2013.  She did have several TIAs back in November and had an event monitor that showed PAF for which she was begun on Eliquis oral anticoagulation.  She walks with  a cane and is relatively asymptomatic. A 2-D echo and carotid Dopplers were unremarkable. She is otherwise a symptomatic.  Since I saw her in the office a year ago she is remained stable.  She is had no further TIAs on Eliquis.  She denies chest pain or shortness of breath.  Apparently she is scheduled for an EGD and colonoscopy in the near future because of blood in her stools.  I told her to stop her Eliquis 2 days prior  The patient does not have symptoms concerning for COVID-19 infection (fever, chills, cough, or new shortness of breath).    Past Medical History:  Diagnosis Date  . Allergic urticaria   . Anemia   . Arthritis    OA AND PAIN LEFT HIP  . Blood transfusion without reported diagnosis   . Cataract   . CHF (congestive heart failure) ([place])   . Glaucoma   . Headache(784.0)    HX OF MIGRAINES  . Hypercholesterolemia   . Hypertension   . Myocardial infarction ([place])   . Osteoporosis   . Paroxysmal atrial fibrillation ([facility])   . Peripheral neuropathy   . PONV (postoperative nausea and vomiting)   . Rib fractures    IN THE PAST  . Shingles 1940'S  . Stroke ([place])   . TIA (transient ischemic attack)    Past Surgical History:  Procedure Laterality Date  . ABDOMINAL HYSTERECTOMY  1958  . APPENDECTOMY  1937  . DILATION AND CURETTAGE OF UTERUS     MULTIPLE  . EXTERNAL FIXATOR LEFT  [place]    . EYE SURGERY  1998 & 2006   BILATERAL CATARACT EXTRACTION  . JOINT REPLACEMENT  2011   RIGHT TOTAL HIP REPLACEMENT  . TONSILLECTOMY  1934  . TOTAL HIP ARTHROPLASTY  05/27/2011   Procedure: TOTAL HIP ARTHROPLASTY;  Surgeon: Gearlean Alf, MD;  Location: [facility];  Service: Orthopedics;  Laterality: Left;     Current Meds  Medication Sig  . amLODipine (NORVASC) 5 MG tablet Take 1 tablet (5 mg total) by mouth daily.  . Cholecalciferol (VITAMIN D3) 2000 UNITS TABS Take 2,000 Units by mouth daily.  Marland Kitchen ELIQUIS 5 MG TABS tablet Take 1 tablet by mouth twice daily  .  furosemide (LASIX) 80 MG tablet Take 1 tablet by mouth once daily  . Multiple Vitamins-Minerals (PRESERVISION AREDS PO) Take 1 tablet by mouth 2 (two) times daily.  Marland Kitchen triamcinolone cream (KENALOG) 0.1 % Use as directed     Allergies:   Codeine; Tylenol [acetaminophen]; and Ibuprofen   Social History   Tobacco Use  . Smoking status: Former Research scientist (life sciences)  . Smokeless tobacco: Never Used  . Tobacco comment: 78 YRS AGO  Substance Use Topics  . Alcohol use: No  . Drug use: No     Family Hx: The patient's family history includes Atrial fibrillation in her son; Heart disease in her mother; Other (age of onset: 71) in her son; Other (age of onset: 23) in her father; Stroke in her brother, mother, and sister. There is no history of Colon cancer, Esophageal cancer, Rectal cancer, or Stomach cancer.  ROS:   Please see the history of present illness.     All other systems reviewed and are negative.   Prior CV studies:   The following studies were reviewed today:  None  Labs/Other Tests and Data Reviewed:    EKG:  No ECG reviewed.  Recent Labs: 08/09/2017: BUN 22; Creatinine, Ser 1.07; Potassium 4.4; Sodium 143   Recent Lipid Panel Lab Results  Component Value Date/Time   CHOL 178 11/07/2016 06:16 AM   TRIG 77 11/07/2016 06:16 AM   HDL 51 11/07/2016 06:16 AM   CHOLHDL 3.5 11/07/2016 06:16 AM   LDLCALC 112 (H) 11/07/2016 06:16 AM    Wt Readings from Last 3 Encounters:  07/05/18 170 lb (77.1 kg)  07/01/18 170 lb (77.1 kg)  06/15/18 160 lb (72.6 kg)     Objective:    Vital Signs:  BP 133/68   Pulse 87   Ht 5\' 3"  (1.6 m)   Wt 170 lb (77.1 kg)   BMI 30.11 kg/m    VITAL SIGNS:  reviewed GEN:  no acute distress RESPIRATORY:  normal respiratory effort, symmetric expansion NEURO:  alert and oriented x 3, no obvious focal deficit PSYCH:  normal affect  ASSESSMENT & PLAN:    1. Essential hypertension- history of essential hypertension blood pressure measured by the patient at  home at 133/68 with a pulse of 87.  She is on amlodipine. 2. History of TIA with PAF in the past on Eliquis oral anticoagulation.  COVID-19 Education: The signs and symptoms of COVID-19 were discussed with the patient and how to seek care for testing (follow up with PCP or arrange E-visit).  The importance of social distancing was discussed today.  Time:   Today, I have spent 7 minutes with the patient with telehealth technology discussing the above problems.     Medication Adjustments/Labs and Tests Ordered: Current medicines are reviewed at length with the patient  today.  Concerns regarding medicines are outlined above.   Tests Ordered: No orders of the defined types were placed in this encounter.   Medication Changes: No orders of the defined types were placed in this encounter.   Disposition:  Follow up in 1 year(s)  Signed, Quay Burow, MD  07/05/2018 1:30 PM    [place]

## 2018-07-05 NOTE — Patient Instructions (Signed)

## 2018-07-06 ENCOUNTER — Telehealth: Payer: Self-pay

## 2018-07-06 NOTE — Telephone Encounter (Signed)
Follow up  ° ° °Pt is returning call  ° ° °Please call back  °

## 2018-07-06 NOTE — Telephone Encounter (Signed)
Left message regarding AVS to f/u in 12 months and that AVS will be mailed to address on file. Letter including After Visit Summary and any other necessary documents to be mailed to the patient's address on file.

## 2018-07-08 ENCOUNTER — Telehealth: Payer: Self-pay | Admitting: Gastroenterology

## 2018-07-08 ENCOUNTER — Telehealth: Payer: Self-pay | Admitting: *Deleted

## 2018-07-08 NOTE — Telephone Encounter (Signed)
Covid-19 screening questions  Have you traveled in the last 14 days? no If yes where?  Do you now or have you had a fever in the last 14 days? no  Do you have any respiratory symptoms of shortness of breath or cough now or in the last 14 days?  Do you have any family members or close contacts with diagnosed or suspected Covid-19 in the past 14 days? no  Have you been tested for Covid-19 and found to be positive? no

## 2018-07-08 NOTE — Telephone Encounter (Signed)
Pls call pt, she has questions regarding prep.

## 2018-07-08 NOTE — Telephone Encounter (Signed)
I spoke with the patient at length about her prep.  Apparently, her appointment was changed to 0830.  She now understands when to take her prep, and how long she can drink clear liquids.   She is clear about when she should be here.   She needed details repeated, but I got good feedback after about ten minutes of explanation.  She is aware to call us if she has any problems or further questions.

## 2018-07-09 ENCOUNTER — Telehealth: Payer: Self-pay | Admitting: Nurse Practitioner

## 2018-07-09 NOTE — Telephone Encounter (Signed)
Daughter stated mother is confused about bowel prep for Monday and she thinks her procedure was cancelled. I verified EGD/colonoscopy still scheduled for Monday 07/11/2018.  PT to arrive to endoscopy Center at 8:30am. Start Clear liquids Sunday 5/31 all day, take first dose of bowel prep at 7pm Sunday and 2nd dose at 3:30am Mon 6/1 then NPO 5 hrs before procedure. Hold Eliquis on Sunday,  Dr. Fuller Plan will provide restart instructions after procedure Monday am.

## 2018-07-11 ENCOUNTER — Ambulatory Visit (AMBULATORY_SURGERY_CENTER): Payer: Medicare Other | Admitting: Gastroenterology

## 2018-07-11 ENCOUNTER — Other Ambulatory Visit: Payer: Self-pay

## 2018-07-11 ENCOUNTER — Encounter: Payer: Self-pay | Admitting: Gastroenterology

## 2018-07-11 VITALS — BP 131/74 | HR 72 | Temp 98.5°F | Resp 22 | Ht 63.0 in | Wt 160.0 lb

## 2018-07-11 DIAGNOSIS — K5730 Diverticulosis of large intestine without perforation or abscess without bleeding: Secondary | ICD-10-CM | POA: Diagnosis not present

## 2018-07-11 DIAGNOSIS — K317 Polyp of stomach and duodenum: Secondary | ICD-10-CM

## 2018-07-11 DIAGNOSIS — K921 Melena: Secondary | ICD-10-CM

## 2018-07-11 DIAGNOSIS — D509 Iron deficiency anemia, unspecified: Secondary | ICD-10-CM | POA: Diagnosis not present

## 2018-07-11 DIAGNOSIS — D123 Benign neoplasm of transverse colon: Secondary | ICD-10-CM | POA: Diagnosis not present

## 2018-07-11 DIAGNOSIS — D132 Benign neoplasm of duodenum: Secondary | ICD-10-CM

## 2018-07-11 DIAGNOSIS — K6289 Other specified diseases of anus and rectum: Secondary | ICD-10-CM

## 2018-07-11 DIAGNOSIS — K2970 Gastritis, unspecified, without bleeding: Secondary | ICD-10-CM | POA: Diagnosis not present

## 2018-07-11 DIAGNOSIS — K629 Disease of anus and rectum, unspecified: Secondary | ICD-10-CM

## 2018-07-11 MED ORDER — FERROUS SULFATE 325 (65 FE) MG PO TABS: 325.0000 mg | ORAL_TABLET | Freq: Two times a day (BID) | ORAL | 2 refills | Status: DC

## 2018-07-11 MED ORDER — SODIUM CHLORIDE 0.9 % IV SOLN: 500.0000 mL | Freq: Once | INTRAVENOUS | Status: DC

## 2018-07-11 NOTE — Progress Notes (Signed)
Pt's states no medical or surgical changes since previsit or office visit. 

## 2018-07-11 NOTE — Progress Notes (Signed)
Covid screening- Maria [name]

## 2018-07-11 NOTE — Op Note (Signed)
[place] Patient Name: Maria [name] Procedure Date: 07/11/2018 9:03 AM MRN: [id number] Endoscopist: Ladene Artist , MD Age: 85 Referring MD:  Date of Birth: 09-18-1922 Gender: Female Account #: [id number] Procedure:                Colonoscopy Indications:              Hematochezia, Iron deficiency anemia Medicines:                Monitored Anesthesia Care Procedure:                Pre-Anesthesia Assessment:                           - Prior to the procedure, a History and Physical                            was performed, and patient medications and                            allergies were reviewed. The patient's tolerance of                            previous anesthesia was also reviewed. The risks                            and benefits of the procedure and the sedation                            options and risks were discussed with the patient.                            All questions were answered, and informed consent                            was obtained. Prior Anticoagulants: The patient has                            taken Eliquis (apixaban), last dose was 2 days                            prior to procedure. ASA Grade Assessment: III - A                            patient with severe systemic disease. After                            reviewing the risks and benefits, the patient was                            deemed in satisfactory condition to undergo the                            procedure.  After obtaining informed consent, the colonoscope                            was passed under direct vision. Throughout the                            procedure, the patient's blood pressure, pulse, and                            oxygen saturations were monitored continuously. The                            Colonoscope was introduced through the anus and                            advanced to the the cecum, identified by               appendiceal orifice and ileocecal valve. The                            ileocecal valve, appendiceal orifice, and rectum                            were photographed. The quality of the bowel                            preparation was good. The colonoscopy was performed                            without difficulty. The patient tolerated the                            procedure well. Scope In: 9:13:35 AM Scope Out: 9:38:20 AM Scope Withdrawal Time: 0 hours 20 minutes 7 seconds  Total Procedure Duration: 0 hours 24 minutes 45 seconds  Findings:                 The perianal and digital rectal examinations were                            normal.                           A 7 mm polyp was found in the transverse colon. The                            polyp was sessile. The polyp was removed with a                            cold snare. Resection and retrieval were complete.                           Multiple medium-mouthed diverticula were found in  the left colon. There was narrowing of the colon in                            association with the diverticular opening. There                            was evidence of diverticular spasm. There was no                            evidence of diverticular bleeding.                           Scattered medium-mouthed diverticula were found in                            the right colon. There was no evidence of                            diverticular bleeding.                           Localized moderate mucosal changes characterized by                            friability and granularity were found in the distal                            rectum. Biopsies were taken with a cold forceps for                            histology.                           The exam was otherwise without abnormality on                            direct and retroflexion views. Complications:            No immediate complications.  Estimated blood loss:                            None. Estimated Blood Loss:     Estimated blood loss: none. Impression:               - One 7 mm polyp in the transverse colon, removed                            with a cold snare. Resected and retrieved.                           - Moderate diverticulosis in the left colon.                           - Mild diverticulosis in the right colon.                           -  Localized moderate mucosal changes were found in                            the distal rectum secondary to proctitis. Biopseis                            were taken with a cold forceps for histology.                           - The examination was otherwise normal on direct                            and retroflexion views. Recommendation:           - Resume Eliquis (apixaban) tomorrow at prior dose.                            Refer to managing physician for further adjustment                            of therapy.                           - Patient has a contact number available for                            emergencies. The signs and symptoms of potential                            delayed complications were discussed with the                            patient. Return to normal activities tomorrow.                            Written discharge instructions were provided to the                            patient.                           - Resume previous diet.                           - Continue present medications.                           - Await pathology results. Ladene Artist, MD 07/11/2018 10:04:04 AM This report has been signed electronically.

## 2018-07-11 NOTE — Progress Notes (Signed)
Called to room to assist during endoscopic procedure.  Patient ID and intended procedure confirmed with present staff. Received instructions for my participation in the procedure from the performing physician.  

## 2018-07-11 NOTE — Op Note (Signed)
[place] Patient Name: Maria [name] Procedure Date: 07/11/2018 9:03 AM MRN: [id number] Endoscopist: Ladene Artist , MD Age: 85 Referring MD:  Date of Birth: [birth year] Gender: Female Account #: [id number] Procedure:                Upper GI endoscopy Indications:              Iron deficiency anemia Medicines:                Monitored Anesthesia Care Procedure:                Pre-Anesthesia Assessment:                           - Prior to the procedure, a History and Physical                            was performed, and patient medications and                            allergies were reviewed. The patient's tolerance of                            previous anesthesia was also reviewed. The risks                            and benefits of the procedure and the sedation                            options and risks were discussed with the patient.                            All questions were answered, and informed consent                            was obtained. Prior Anticoagulants: The patient has                            taken Eliquis (apixaban), last dose was 2 days                            prior to procedure. ASA Grade Assessment: III - A                            patient with severe systemic disease. After                            reviewing the risks and benefits, the patient was                            deemed in satisfactory condition to undergo the                            procedure.  After obtaining informed consent, the endoscope was                            passed under direct vision. Throughout the                            procedure, the patient's blood pressure, pulse, and                            oxygen saturations were monitored continuously. The                            Endoscope was introduced through the mouth, and                            advanced to the second part of duodenum. The upper                GI endoscopy was accomplished without difficulty.                            The patient tolerated the procedure well. Scope In: Scope Out: Findings:                 Patchy, white plaques were found in the proximal                            esophagus. Biopsies were taken with a cold forceps                            for histology.                           The exam of the esophagus was otherwise normal.                           Two 5 to 6 mm sessile polyps with no bleeding and                            no stigmata of recent bleeding were found in the                            gastric body. The smaller polyp was removed with a                            cold biopsy forceps and the larger polyp was                            removed with a cold snare. Resection and retrieval                            were complete.                           Diffuse atrophic mucosa was found in the gastric  fundus and in the gastric body. Biopsies were taken                            with a cold forceps for histology.                           The exam of the stomach was otherwise normal.                           A single 5 mm sessile polyp with no bleeding was                            found in the second portion of the duodenum. The                            polyp was removed with a cold snare. Resection and                            retrieval were complete.                           The exam of the duodenum was otherwise normal.                            Biopsies obtained to exclude celiac disease. Complications:            No immediate complications. Estimated Blood Loss:     Estimated blood loss was minimal. Impression:               - Esophageal plaques were found, suspicious for                            candidiasis. Biopsied.                           - Two gastric polyps. Resected and retrieved.                           - Gastric mucosal atrophy.  Biopsied.                           - A single duodenal polyp. Resected and retrieved. Recommendation:           - Patient has a contact number available for                            emergencies. The signs and symptoms of potential                            delayed complications were discussed with the                            patient. Return to normal activities tomorrow.  Written discharge instructions were provided to the                            patient.                           - Resume previous diet.                           - Continue present medications.                           - Resume Eliquis (apixaban) at prior dose tomorrow.                            Refer to managing physician for further adjustment                            of therapy.                           - Await pathology results.                           - FeSO4 325 mg po bid after meals, #60, 2 refills Ladene Artist, MD 07/11/2018 10:13:47 AM This report has been signed electronically.

## 2018-07-11 NOTE — Progress Notes (Signed)
To PACU, VSS. Report to [email or web address] 

## 2018-07-11 NOTE — Patient Instructions (Signed)
Discharge instructions given. Handouts on polyps,diverticulosis and Gastritis. Resume previous medications. Resume Eliquis at prior dose tomorrow. Prescription sent to pharmacy. YOU HAD AN ENDOSCOPIC PROCEDURE TODAY AT [facility]:   Refer to the procedure report that was given to you for any specific questions about what was found during the examination.  If the procedure report does not answer your questions, please call your gastroenterologist to clarify.  If you requested that your care partner not be given the details of your procedure findings, then the procedure report has been included in a sealed envelope for you to review at your convenience later.  YOU SHOULD EXPECT: Some feelings of bloating in the abdomen. Passage of more gas than usual.  Walking can help get rid of the air that was put into your GI tract during the procedure and reduce the bloating. If you had a lower endoscopy (such as a colonoscopy or flexible sigmoidoscopy) you may notice spotting of blood in your stool or on the toilet paper. If you underwent a bowel prep for your procedure, you may not have a normal bowel movement for a few days.  Please Note:  You might notice some irritation and congestion in your nose or some drainage.  This is from the oxygen used during your procedure.  There is no need for concern and it should clear up in a day or so.  SYMPTOMS TO REPORT IMMEDIATELY:   Following lower endoscopy (colonoscopy or flexible sigmoidoscopy):  Excessive amounts of blood in the stool  Significant tenderness or worsening of abdominal pains  Swelling of the abdomen that is new, acute  Fever of 100F or higher   Following upper endoscopy (EGD)  Vomiting of blood or coffee ground material  New chest pain or pain under the shoulder blades  Painful or persistently difficult swallowing  New shortness of breath  Fever of 100F or higher  Black, tarry-looking stools  For urgent or emergent  issues, a gastroenterologist can be reached at any hour by calling [phone].   DIET:  We do recommend a small meal at first, but then you may proceed to your regular diet.  Drink plenty of fluids but you should avoid alcoholic beverages for 24 hours.  ACTIVITY:  You should plan to take it easy for the rest of today and you should NOT DRIVE or use heavy machinery until tomorrow (because of the sedation medicines used during the test).    FOLLOW UP: Our staff will call the number listed on your records 48-72 hours following your procedure to check on you and address any questions or concerns that you may have regarding the information given to you following your procedure. If we do not reach you, we will leave a message.  We will attempt to reach you two times.  During this call, we will ask if you have developed any symptoms of COVID 19. If you develop any symptoms (ie: fever, flu-like symptoms, shortness of breath, cough etc.) before then, please call [phone].  If you test positive for Covid 19 in the 2 weeks post procedure, please call and report this information to [place].    If any biopsies were taken you will be contacted by phone or by letter within the next 1-3 weeks.  Please call us at [phone] if you have not heard about the biopsies in 3 weeks.    SIGNATURES/CONFIDENTIALITY: You and/or your care partner have signed paperwork which will be entered into your electronic medical record.  These signatures attest to the fact that that the information above on your After Visit Summary has been reviewed and is understood.  Full responsibility of the confidentiality of this discharge information lies with you and/or your care-partner.

## 2018-07-13 ENCOUNTER — Telehealth: Payer: Self-pay

## 2018-07-13 NOTE — Telephone Encounter (Signed)
  Follow up Call-  Call back number 07/11/2018  Post procedure Call Back phone  # [phone]  Permission to leave phone message Yes  Some recent data might be hidden     Number is busy

## 2018-07-13 NOTE — Telephone Encounter (Signed)
No answer, phone busy, will call back later today, [place] RN

## 2018-07-19 ENCOUNTER — Encounter: Payer: Self-pay | Admitting: Gastroenterology

## 2018-07-20 ENCOUNTER — Telehealth: Payer: Self-pay | Admitting: Gastroenterology

## 2018-07-20 NOTE — Telephone Encounter (Signed)
[name] [phone] patient Grand-daughter called has a question regarding the patient urinating. The patient had a colonoscopy on 07-11-18

## 2018-07-20 NOTE — Telephone Encounter (Signed)
Patient has had less frequent urination.  Granddaughter is asking if this is from iron.  She is advised that not due to iron.  She is not having any other complaints.  They are advised that if this is a concern for her she should check with her PCP.  No GI or other urinary complaints or pain.

## 2018-08-04 ENCOUNTER — Other Ambulatory Visit: Payer: Self-pay | Admitting: Cardiovascular Disease

## 2018-08-04 NOTE — Telephone Encounter (Signed)
Pt is a 27 yof requesting eliquis 5mg , wt 72.6kg, scr 0.91 (05/02/18), lov w/berry(07/05/18).

## 2018-10-25 ENCOUNTER — Other Ambulatory Visit: Payer: Self-pay | Admitting: Cardiovascular Disease

## 2018-11-04 ENCOUNTER — Other Ambulatory Visit: Payer: Self-pay | Admitting: Cardiovascular Disease

## 2018-11-10 ENCOUNTER — Telehealth: Payer: Self-pay | Admitting: Cardiovascular Disease

## 2018-11-10 NOTE — Telephone Encounter (Signed)
Spoke with pt granddaughter and advised that amoxicillin okay for pt to take with her other medications; pt granddaughter verbalized understanding

## 2018-11-10 NOTE — Telephone Encounter (Signed)
  Patient was given Amoxicillan by her dentist to take by 9:15am this morning and her granddaughter wants to know if it is safe for her to take this with her other medications.

## 2019-02-12 ENCOUNTER — Other Ambulatory Visit: Payer: Self-pay | Admitting: Cardiovascular Disease

## 2019-02-28 ENCOUNTER — Ambulatory Visit: Payer: Medicare Other | Attending: Internal Medicine

## 2019-02-28 DIAGNOSIS — Z23 Encounter for immunization: Secondary | ICD-10-CM | POA: Insufficient documentation

## 2019-02-28 NOTE — Progress Notes (Signed)
   Covid-19 Vaccination Clinic  Name:  Maria [name]    MRN: [id number] DOB: [birth year]  02/28/2019  [name] was observed post Covid-19 immunization for 15 minutes without incidence. She was provided with Vaccine Information Sheet and instruction to access the V-Safe system.   [name] was instructed to call 911 with any severe reactions post vaccine: Marland Kitchen Difficulty breathing  . Swelling of your face and throat  . A fast heartbeat  . A bad rash all over your body  . Dizziness and weakness    Immunizations Administered    Name Date Dose VIS Date Route   Pfizer COVID-19 Vaccine 02/28/2019  2:25 PM 0.3 mL 01/20/2019 Intramuscular   Manufacturer: [place]   Lot: [id number]   [place]: [id number]

## 2019-03-17 ENCOUNTER — Other Ambulatory Visit: Payer: Self-pay | Admitting: Internal Medicine

## 2019-03-17 DIAGNOSIS — R911 Solitary pulmonary nodule: Secondary | ICD-10-CM

## 2019-03-21 ENCOUNTER — Ambulatory Visit: Payer: Medicare Other

## 2019-03-21 ENCOUNTER — Ambulatory Visit: Payer: Medicare Other | Attending: Internal Medicine

## 2019-03-21 ENCOUNTER — Ambulatory Visit: Payer: Self-pay

## 2019-03-21 DIAGNOSIS — Z23 Encounter for immunization: Secondary | ICD-10-CM

## 2019-03-21 NOTE — Progress Notes (Signed)
   Covid-19 Vaccination Clinic  Name:  Maria [name]    MRN: [id number] DOB: [birth year]  03/21/2019  [name] was observed post Covid-19 immunization for 15 minutes without incidence. She was provided with Vaccine Information Sheet and instruction to access the V-Safe system.   [name] was instructed to call 911 with any severe reactions post vaccine: Marland Kitchen Difficulty breathing  . Swelling of your face and throat  . A fast heartbeat  . A bad rash all over your body  . Dizziness and weakness    Immunizations Administered    Name Date Dose VIS Date Route   Pfizer COVID-19 Vaccine 03/21/2019  4:40 PM 0.3 mL 01/20/2019 Intramuscular   Manufacturer: [place]   Lot: [id number]   [place]: [id number]

## 2019-03-27 ENCOUNTER — Other Ambulatory Visit: Payer: Medicare Other

## 2019-03-30 ENCOUNTER — Other Ambulatory Visit: Payer: Self-pay | Admitting: Cardiovascular Disease

## 2019-05-02 ENCOUNTER — Encounter: Payer: Self-pay | Admitting: Cardiovascular Disease

## 2019-05-02 ENCOUNTER — Other Ambulatory Visit: Payer: Self-pay

## 2019-05-02 ENCOUNTER — Ambulatory Visit: Payer: Medicare Other | Admitting: Cardiovascular Disease

## 2019-05-02 VITALS — BP 154/84 | HR 78 | Ht 63.0 in | Wt 171.6 lb

## 2019-05-02 DIAGNOSIS — I10 Essential (primary) hypertension: Secondary | ICD-10-CM | POA: Diagnosis not present

## 2019-05-02 DIAGNOSIS — E782 Mixed hyperlipidemia: Secondary | ICD-10-CM | POA: Diagnosis not present

## 2019-05-02 DIAGNOSIS — I480 Paroxysmal atrial fibrillation: Secondary | ICD-10-CM | POA: Diagnosis not present

## 2019-05-02 NOTE — Assessment & Plan Note (Signed)
History of essential hypertension with blood pressure measured today 154/84 she is on amlodipine.

## 2019-05-02 NOTE — Assessment & Plan Note (Signed)
History of hyperlipidemia on Zetia followed by her PCP. Her most recent lipid profile performed 03/03/2019 revealed a total cholesterol 179.

## 2019-05-02 NOTE — Progress Notes (Signed)
05/02/2019 DORTHA [name]   16-Apr-1922  [id number]  Primary Physician Jani Gravel, MD Primary Cardiologist: Lorretta Harp MD [name], [name]  HPI:  Maria [name] is a 86 y.o.  mildly overweight married Caucasian female (second marriage) who just celebrated her 61st wedding anniversary. She is a mother of 2 children one of whom is deceased and 2 granddaughters.She is accompanied by her husband today. I last saw her for a virtual video telemedicine visit on  07/05/2018.She was referred by Dr. Maudie Mercury for cardiovascular evaluation. In the past she's worked as a Network engineer at [employer] is also worked at a Biochemist, clinical and an Metallurgist. She does have a history of hyperlipidemia currently not on statin therapy and hypertension treated with beta blocker. She's never had a heart attack or stroke. She denies chest pain or shortness of breath. She did have to total hip replacement in 2011 and 2013.She did have several TIAs back in November and had an event monitor that showed PAF for which she was begun on Eliquis oral anticoagulation. She walks with a cane and is relatively asymptomatic. A 2-D echo and carotid Dopplers were unremarkable. She is otherwise a symptomatic.    She is had no further TIAs on Eliquis.   She underwent EGD/colonoscopy last year without specific findings. Since I saw her 10 months ago she continues to do well. She is asymptomatic specifically denying chest pain or shortness of breath.   Current Meds  Medication Sig  . amLODipine (NORVASC) 5 MG tablet Take 1 tablet by mouth once daily  . Cholecalciferol (VITAMIN D3) 2000 UNITS TABS Take 2,000 Units by mouth daily.  . Cyanocobalamin (VITAMIN B-12 PO) Take by mouth.  Arne [place] 5 MG TABS tablet Take 1 tablet by mouth twice daily  . ezetimibe (ZETIA) 10 MG tablet Take 10 mg by mouth daily.  . ferrous sulfate 325 (65 FE) MG tablet Take 1 tablet (325 mg total) by mouth 2  (two) times daily after a meal.  . furosemide (LASIX) 80 MG tablet Take 1 tablet by mouth once daily  . Multiple Vitamins-Minerals (PRESERVISION AREDS PO) Take 1 tablet by mouth 2 (two) times daily.  Marland Kitchen triamcinolone cream (KENALOG) 0.1 % Use as directed     Allergies  Allergen Reactions  . Codeine Nausea And Vomiting  . Tylenol [Acetaminophen]     ITCHING  . Ibuprofen Itching and Rash    Social History   Socioeconomic History  . Marital status: Married    Spouse name: Not on file  . Number of children: 1  . Years of education: Not on file  . Highest education level: Some college, no degree  Occupational History  . Occupation: retired  Tobacco Use  . Smoking status: Former Research scientist (life sciences)  . Smokeless tobacco: Never Used  . Tobacco comment: 75 YRS AGO  Substance and Sexual Activity  . Alcohol use: No  . Drug use: No  . Sexual activity: Not on file  Other Topics Concern  . Not on file  Social History Narrative   Lives with husband in a one story home.  They are married for 59 years.  Has 1 son.  Retired from working in Insurance underwriter.  Education: some college.   Social Determinants of Health   Financial Resource Strain:   . Difficulty of Paying Living Expenses:   Food Insecurity:   . Worried About Charity fundraiser in the Last Year:   . [employer]  of Food in the Last Year:   Transportation Needs:   . Film/video editor (Medical):   Marland Kitchen Lack of Transportation (Non-Medical):   Physical Activity:   . Days of Exercise per Week:   . Minutes of Exercise per Session:   Stress:   . Feeling of Stress :   Social Connections:   . Frequency of Communication with Friends and Family:   . Frequency of Social Gatherings with Friends and Family:   . Attends Religious Services:   . Active Member of Clubs or Organizations:   . Attends Archivist Meetings:   Marland Kitchen Marital Status:   Intimate Partner Violence:   . Fear of Current or Ex-Partner:   . Emotionally Abused:   Marland Kitchen Physically  Abused:   . Sexually Abused:      Review of Systems: General: negative for chills, fever, night sweats or weight changes.  Cardiovascular: negative for chest pain, dyspnea on exertion, edema, orthopnea, palpitations, paroxysmal nocturnal dyspnea or shortness of breath Dermatological: negative for rash Respiratory: negative for cough or wheezing Urologic: negative for hematuria Abdominal: negative for nausea, vomiting, diarrhea, bright red blood per rectum, melena, or hematemesis Neurologic: negative for visual changes, syncope, or dizziness All other systems reviewed and are otherwise negative except as noted above.    Blood pressure (!) 154/84, pulse 78, height 5\' 3"  (1.6 m), weight 171 lb 9.6 oz (77.8 kg), SpO2 94 %.  General appearance: alert and no distress Neck: no adenopathy, no carotid bruit, no JVD, supple, symmetrical, trachea midline and thyroid not enlarged, symmetric, no tenderness/mass/nodules Lungs: clear to auscultation bilaterally Heart: irregularly irregular rhythm Extremities: extremities normal, atraumatic, no cyanosis or edema Pulses: 2+ and symmetric Skin: Skin color, texture, turgor normal. No rashes or lesions Neurologic: Alert and oriented X 3, normal strength and tone. Normal symmetric reflexes. Normal coordination and gait  EKG atrial fibrillation with a ventricular spots of 78 and septal Q waves. I personally reviewed this EKG.  ASSESSMENT AND PLAN:   Hyperlipidemia History of hyperlipidemia on Zetia followed by her PCP. Her most recent lipid profile performed 03/03/2019 revealed a total cholesterol 179.  Essential hypertension History of essential hypertension with blood pressure measured today 154/84 she is on amlodipine.  Paroxysmal atrial fibrillation (HCC) History of paroxysmal atrial fibrillation on Eliquis oral anticoagulation currently in A. fib today which she is unaware of. Her ventricular sponsor 78.      Lorretta Harp MD  FACP,FACC,FAHA, [facility] 05/02/2019 2:27 PM

## 2019-05-02 NOTE — Patient Instructions (Signed)
Medication Instructions:  NO CHANGE *If you need a refill on your cardiac medications before your next appointment, please call your pharmacy*   Lab Work: If you have labs (blood work) drawn today and your tests are completely normal, you will receive your results only by: Marland Kitchen MyChart Message (if you have MyChart) OR . A paper copy in the mail If you have any lab test that is abnormal or we need to change your treatment, we will call you to review the result.   Follow-Up: At [facility], you and your health needs are our priority.  As part of our continuing mission to provide you with exceptional heart care, we have created designated Provider Care Teams.  These Care Teams include your primary Cardiologist (physician) and Advanced Practice Providers (APPs -  Physician Assistants and Nurse Practitioners) who all work together to provide you with the care you need, when you need it.  We recommend signing up for the patient portal called "MyChart".  Sign up information is provided on this After Visit Summary.  MyChart is used to connect with patients for Virtual Visits (Telemedicine).  Patients are able to view lab/test results, encounter notes, upcoming appointments, etc.  Non-urgent messages can be sent to your provider as well.   To learn more about what you can do with MyChart, go to [email or web address].    Your next appointment:   12 month(s)  The format for your next appointment:   Either In Person or Virtual  Provider:   You may see Quay Burow, MD or one of the following Advanced Practice Providers on your designated Care Team:    Kerin Ransom, PA-C  [place], [place]  Coletta Memos, [place]

## 2019-05-02 NOTE — Assessment & Plan Note (Signed)
History of paroxysmal atrial fibrillation on Eliquis oral anticoagulation currently in A. fib today which she is unaware of. Her ventricular sponsor 78.

## 2019-05-06 ENCOUNTER — Other Ambulatory Visit: Payer: Self-pay | Admitting: Cardiovascular Disease

## 2019-05-25 ENCOUNTER — Emergency Department (HOSPITAL_COMMUNITY): Payer: Medicare Other

## 2019-05-25 ENCOUNTER — Other Ambulatory Visit: Payer: Self-pay

## 2019-05-25 ENCOUNTER — Encounter (HOSPITAL_COMMUNITY): Payer: Self-pay

## 2019-05-25 ENCOUNTER — Emergency Department (HOSPITAL_COMMUNITY)
Admission: EM | Admit: 2019-05-25 | Discharge: 2019-05-25 | Disposition: A | Discharge status: Home / Self Care | Payer: Medicare Other | Attending: Emergency Medicine | Admitting: Emergency Medicine

## 2019-05-25 DIAGNOSIS — I110 Hypertensive heart disease with heart failure: Secondary | ICD-10-CM | POA: Diagnosis not present

## 2019-05-25 DIAGNOSIS — Y93H2 Activity, gardening and landscaping: Secondary | ICD-10-CM | POA: Diagnosis not present

## 2019-05-25 DIAGNOSIS — W01198A Fall on same level from slipping, tripping and stumbling with subsequent striking against other object, initial encounter: Secondary | ICD-10-CM | POA: Diagnosis not present

## 2019-05-25 DIAGNOSIS — Z87891 Personal history of nicotine dependence: Secondary | ICD-10-CM | POA: Insufficient documentation

## 2019-05-25 DIAGNOSIS — I480 Paroxysmal atrial fibrillation: Secondary | ICD-10-CM | POA: Insufficient documentation

## 2019-05-25 DIAGNOSIS — Z7901 Long term (current) use of anticoagulants: Secondary | ICD-10-CM | POA: Insufficient documentation

## 2019-05-25 DIAGNOSIS — I252 Old myocardial infarction: Secondary | ICD-10-CM | POA: Insufficient documentation

## 2019-05-25 DIAGNOSIS — I509 Heart failure, unspecified: Secondary | ICD-10-CM | POA: Insufficient documentation

## 2019-05-25 DIAGNOSIS — Z8673 Personal history of transient ischemic attack (TIA), and cerebral infarction without residual deficits: Secondary | ICD-10-CM | POA: Insufficient documentation

## 2019-05-25 DIAGNOSIS — Z96643 Presence of artificial hip joint, bilateral: Secondary | ICD-10-CM | POA: Diagnosis not present

## 2019-05-25 DIAGNOSIS — Y999 Unspecified external cause status: Secondary | ICD-10-CM | POA: Diagnosis not present

## 2019-05-25 DIAGNOSIS — S81811A Laceration without foreign body, right lower leg, initial encounter: Secondary | ICD-10-CM | POA: Insufficient documentation

## 2019-05-25 DIAGNOSIS — Z79899 Other long term (current) drug therapy: Secondary | ICD-10-CM | POA: Insufficient documentation

## 2019-05-25 DIAGNOSIS — W19XXXA Unspecified fall, initial encounter: Secondary | ICD-10-CM

## 2019-05-25 DIAGNOSIS — Y92017 Garden or yard in single-family (private) house as the place of occurrence of the external cause: Secondary | ICD-10-CM | POA: Insufficient documentation

## 2019-05-25 MED ORDER — LIDOCAINE-EPINEPHRINE (PF) 2 %-1:200000 IJ SOLN
10.0000 mL | Freq: Once | INTRAMUSCULAR | Status: AC
  Administered 2019-05-25: 10 mL
  Filled 2019-05-25: qty 20

## 2019-05-25 MED ORDER — TETANUS-DIPHTH-ACELL PERTUSSIS 5-2.5-18.5 LF-MCG/0.5 IM SUSP
0.5000 mL | Freq: Once | INTRAMUSCULAR | Status: AC
  Administered 2019-05-25: 23:00:00 0.5 mL via INTRAMUSCULAR
  Filled 2019-05-25: qty 0.5

## 2019-05-25 NOTE — ED Triage Notes (Signed)
Pt arrives to ED after a fall. Pt takes eliquis. Pt denies hitting head, denies LOC. AOx4 neuro intact. Pt presents w/ lac to RLE.

## 2019-05-25 NOTE — Progress Notes (Signed)
Orthopedic Tech Progress Note Patient Details:  Maria [name] 1922/05/11 [id number] Level 2 Trauma fobt Patient ID: Maria [name], female   DOB: October 02, 1922, 86 y.o.   MRN: [id number]   Maria [name] 05/25/2019, 9:42 PM

## 2019-05-25 NOTE — ED Provider Notes (Signed)
[facility] EMERGENCY DEPARTMENT Provider Note   CSN: [id number] Arrival date & time: 05/25/19  2057     History Chief Complaint  Patient presents with  . Fall    Maria [name] is a 86 y.o. female.  Pt presents to the ED today with a fall and right lower leg laceration.  She saw some weeds in the garden and was using a rake to get them out and tripped and fell against a brick.  Her husband cleaned the wound with peroxide and alcohol.  She denies hitting her head.  She is on Eliquis for a hx of afib.  CHA2DS2/VAS Stroke Risk Points  Current as of 9 minutes ago     7 >= 2 Points: High Risk  1 - 1.99 Points: Medium Risk  0 Points: Low Risk    This is the only CHA2DS2/VAS Stroke Risk Points available for the past  year.: Last Change: N/A     Details    This score determines the patient's risk of having a stroke if the  patient has atrial fibrillation.       Points Metrics  1 Has Congestive Heart Failure:  Yes    Current as of 9 minutes ago  0 Has Vascular Disease:  No    Current as of 9 minutes ago  1 Has Hypertension:  Yes    Current as of 9 minutes ago  2 Age:  63    Current as of 9 minutes ago  0 Has Diabetes:  No    Current as of 9 minutes ago  2 Had Stroke:  No  Had TIA:  Yes  Had thromboembolism:  No    Current as of 9 minutes ago  1 Female:  Yes    Current as of 9 minutes ago                 Past Medical History:  Diagnosis Date  . Allergic urticaria   . Anemia   . Arthritis    OA AND PAIN LEFT HIP  . Blood transfusion without reported diagnosis   . Cataract   . CHF (congestive heart failure) ([place])   . Glaucoma   . Headache(784.0)    HX OF MIGRAINES  . Hypercholesterolemia   . Hypertension   . Myocardial infarction ([place])   . Osteoporosis   . Paroxysmal atrial fibrillation (HCC)   . Peripheral neuropathy   . PONV (postoperative nausea and vomiting)   . Rib fractures    IN THE PAST  . Shingles 1940'S  . Stroke  ([place])   . TIA (transient ischemic attack)     Patient Active Problem List   Diagnosis Date Noted  . Paroxysmal atrial fibrillation ([place]) 01/29/2017  . ARF (acute renal failure) ([place]) 11/25/2016  . Hypertensive urgency   . TIA (transient ischemic attack) 03/12/2015  . Dependent edema 03/12/2015  . Dysphagia   . Hyperlipidemia 11/16/2014  . Essential hypertension 11/16/2014  . Postop Acute blood loss anemia 06/01/2011  . Osteoarthritis of hip 05/27/2011    Past Surgical History:  Procedure Laterality Date  . ABDOMINAL HYSTERECTOMY  1958  . APPENDECTOMY  1937  . DILATION AND CURETTAGE OF UTERUS     MULTIPLE  . EXTERNAL FIXATOR LEFT ARM FRACTURE 1997    . EYE SURGERY  1998 & 2006   BILATERAL CATARACT EXTRACTION  . JOINT REPLACEMENT  2011   RIGHT TOTAL HIP REPLACEMENT  . TONSILLECTOMY  1934  . TOTAL HIP  ARTHROPLASTY  05/27/2011   Procedure: TOTAL HIP ARTHROPLASTY;  Surgeon: Gearlean Alf, MD;  Location: [facility] ORS;  Service: Orthopedics;  Laterality: Left;     OB History   No obstetric history on file.     Family History  Problem Relation Age of Onset  . Heart disease Mother   . Stroke Mother   . Other Father 43       MVA  . Stroke Sister   . Stroke Brother   . Atrial fibrillation Son   . Other Son 12       fire  . Colon cancer Neg Hx   . Esophageal cancer Neg Hx   . Rectal cancer Neg Hx   . Stomach cancer Neg Hx     Social History   Tobacco Use  . Smoking status: Former Research scientist (life sciences)  . Smokeless tobacco: Never Used  . Tobacco comment: 54 YRS AGO  Substance Use Topics  . Alcohol use: No  . Drug use: No    Home Medications Prior to Admission medications   Medication Sig Start Date End Date Taking? Authorizing Provider  amLODipine (NORVASC) 5 MG tablet Take 1 tablet by mouth once daily Patient taking differently: Take 5 mg by mouth daily.  05/08/19  Yes Lorretta Harp, MD  Cyanocobalamin (VITAMIN B-12 PO) Take 1 tablet by mouth daily with breakfast.    Yes  [provider]  ELIQUIS 5 MG TABS tablet Take 1 tablet by mouth twice daily Patient taking differently: Take 5 mg by mouth 2 (two) times daily.  05/08/19  Yes Lorretta Harp, MD  ezetimibe (ZETIA) 10 MG tablet Take 10 mg by mouth daily with lunch.  04/29/19  Yes [provider]  furosemide (LASIX) 80 MG tablet Take 1 tablet by mouth once daily Patient taking differently: Take 80 mg by mouth in the morning.  03/30/19  Yes Lorretta Harp, MD  hydrocortisone cream 1 % Apply 1 application topically 2 (two) times daily as needed (for itching- apply to affected areas).    Yes [provider]  Multiple Vitamins-Minerals (PRESERVISION AREDS PO) Take 1 tablet by mouth 2 (two) times daily.   Yes [provider]  ferrous sulfate 325 (65 FE) MG tablet Take 1 tablet (325 mg total) by mouth 2 (two) times daily after a meal. Patient not taking: Reported on 05/25/2019 07/11/18   Ladene Artist, MD    Allergies    Codeine, Tylenol [acetaminophen], Ibuprofen, Latex, and Tape  Review of Systems   Review of Systems  Skin: Positive for wound.  All other systems reviewed and are negative.   Physical Exam Updated Vital Signs BP 134/90   Pulse 76   Temp 98.3 F (36.8 C)   Resp 18   SpO2 96%   Physical Exam Vitals and nursing note reviewed.  Constitutional:      Appearance: Normal appearance.  HENT:     Head: Normocephalic and atraumatic.     Right Ear: External ear normal.     Left Ear: External ear normal.     Nose: Nose normal.     Mouth/Throat:     Mouth: Mucous membranes are moist.     Pharynx: Oropharynx is clear.  Eyes:     Extraocular Movements: Extraocular movements intact.     Conjunctiva/sclera: Conjunctivae normal.     Pupils: Pupils are equal, round, and reactive to light.  Cardiovascular:     Rate and Rhythm: Normal rate and regular rhythm.  Pulses: Normal pulses.     Heart sounds: Normal heart sounds.  Pulmonary:     Effort: Pulmonary  effort is normal.     Breath sounds: Normal breath sounds.  Abdominal:     General: Abdomen is flat. Bowel sounds are normal.     Palpations: Abdomen is soft.  Musculoskeletal:        General: Normal range of motion.     Cervical back: Normal range of motion and neck supple.  Skin:    Capillary Refill: Capillary refill takes less than 2 seconds.     Comments: Lac to RLE  Neurological:     General: No focal deficit present.     Mental Status: She is alert and oriented to person, place, and time.  Psychiatric:        Mood and Affect: Mood normal.        Behavior: Behavior normal.        Thought Content: Thought content normal.        Judgment: Judgment normal.     ED Results / Procedures / Treatments   Labs (all labs ordered are listed, but only abnormal results are displayed) Labs Reviewed - No data to display  EKG None  Radiology DG Tibia/Fibula Right  Result Date: 05/25/2019 CLINICAL DATA:  Level 2 trauma laceration EXAM: RIGHT TIBIA AND FIBULA - 2 VIEW COMPARISON:  None. FINDINGS: No fracture or malalignment. Soft tissue defect anteriorly at the mid lower leg presumably due to laceration. No radiopaque foreign body. Degenerative changes at the medial knee IMPRESSION: No acute osseous abnormality Electronically Signed   By: Donavan Foil M.D.   On: 05/25/2019 21:56    Procedures .Marland KitchenLaceration Repair  Date/Time: 05/25/2019 10:25 PM Performed by: Isla Pence, MD Authorized by: Isla Pence, MD   Consent:    Consent obtained:  Verbal   Consent given by:  Patient   Risks discussed:  Pain   Alternatives discussed:  No treatment Anesthesia (see MAR for exact dosages):    Anesthesia method:  Local infiltration   Local anesthetic:  Lidocaine 2% WITH epi Laceration details:    Location:  Leg   Leg location:  R lower leg   Length (cm):  10 Repair type:    Repair type:  Intermediate Pre-procedure details:    Preparation:  Patient was prepped and draped in usual  sterile fashion Exploration:    Contaminated: no   Treatment:    Area cleansed with:  Saline   Amount of cleaning:  Extensive   Irrigation method:  Pressure wash Skin repair:    Repair method:  Steri-Strips Approximation:    Approximation:  Close Post-procedure details:    Dressing:  Non-adherent dressing   Patient tolerance of procedure:  Tolerated well, no immediate complications   (including critical care time)  Medications Ordered in ED Medications  lidocaine-EPINEPHrine (XYLOCAINE W/EPI) 2 %-1:200000 (PF) injection 10 mL (has no administration in time range)  Tdap (BOOSTRIX) injection 0.5 mL (has no administration in time range)    ED Course  I have reviewed the triage vital signs and the nursing notes.  Pertinent labs & imaging results that were available during my care of the patient were reviewed by me and considered in my medical decision making (see chart for details).    MDM Rules/Calculators/A&P                      Pt had very thin and fragile skin, so I could not get  her skin to tolerate stitches, so she was steri-stripped.   Final Clinical Impression(s) / ED Diagnoses Final diagnoses:  Fall, initial encounter  Leg laceration, right, initial encounter    Rx / DC Orders ED Discharge Orders    None       Isla Pence, MD 05/25/19 2227

## 2019-05-25 NOTE — Progress Notes (Signed)
Chaplain responded to alert.  Maria [name] has family support.  Chaplain will follow-up as needed.

## 2019-06-18 ENCOUNTER — Other Ambulatory Visit: Payer: Self-pay | Admitting: Cardiovascular Disease

## 2019-06-30 ENCOUNTER — Other Ambulatory Visit: Payer: Self-pay | Admitting: Cardiovascular Disease

## 2019-07-04 ENCOUNTER — Other Ambulatory Visit: Payer: Self-pay | Admitting: Cardiovascular Disease

## 2019-07-04 MED ORDER — FUROSEMIDE 80 MG PO TABS: 80.0000 mg | ORAL_TABLET | Freq: Every day | ORAL | 1 refills | Status: DC

## 2019-07-04 NOTE — Telephone Encounter (Signed)
  *  STAT* If patient is at the pharmacy, call can be transferred to refill team.   1. Which medications need to be refilled? (please list name of each medication and dose if known) furosemide (LASIX) 80 MG tablet  2. Which pharmacy/location (including street and city if local pharmacy) is medication to be sent to? [place] ([place]), [place] - [address]  3. Do they need a 30 day or 90 day supply? 90 days

## 2019-08-20 ENCOUNTER — Other Ambulatory Visit: Payer: Self-pay | Admitting: Cardiovascular Disease

## 2019-08-21 ENCOUNTER — Other Ambulatory Visit: Payer: Self-pay | Admitting: Cardiovascular Disease

## 2019-08-21 NOTE — Telephone Encounter (Signed)
*  STAT* If patient is at the pharmacy, call can be transferred to refill team.   1. Which medications need to be refilled? (please list name of each medication and dose if known)  ELIQUIS 5 MG TABS tablet  2. Which pharmacy/location (including street and city if local pharmacy) is medication to be sent to? [place] ([place]), [place] - [address]  3. Do they need a 30 day or 90 day supply? 30 day  Patient is out of medication

## 2019-11-07 ENCOUNTER — Other Ambulatory Visit: Payer: Self-pay | Admitting: Cardiovascular Disease

## 2019-11-07 NOTE — Telephone Encounter (Signed)
°*  STAT* If patient is at the pharmacy, call can be transferred to refill team.   1. Which medications need to be refilled? (please list name of each medication and dose if known) apixaban (ELIQUIS) 5 MG TABS tablet  2. Which pharmacy/location (including street and city if local pharmacy) is medication to be sent to? [place] ([place]), [place] - [address]  3. Do they need a 30 day or 90 day supply? 90 day supply  Pt has an appointment scheduled for 05/01/20

## 2019-11-08 MED ORDER — APIXABAN 5 MG PO TABS: 5.0000 mg | ORAL_TABLET | Freq: Two times a day (BID) | ORAL | 1 refills | Status: DC

## 2019-11-08 NOTE — Telephone Encounter (Signed)
30f  77.8gk Creatinine, Serum 0.870 MG/ 09/12/2019 Lovw/berry 05/02/19

## 2019-12-27 ENCOUNTER — Other Ambulatory Visit (HOSPITAL_COMMUNITY): Payer: Self-pay | Admitting: Internal Medicine

## 2019-12-27 ENCOUNTER — Ambulatory Visit: Payer: Medicare Other | Attending: Internal Medicine

## 2019-12-27 DIAGNOSIS — Z23 Encounter for immunization: Secondary | ICD-10-CM

## 2019-12-27 NOTE — Progress Notes (Signed)
   Covid-19 Vaccination Clinic  Name:  Maria [name]    MRN: [id number] DOB: 1922/05/13  12/27/2019  [name] was observed post Covid-19 immunization for 15 minutes without incident. She was provided with Vaccine Information Sheet and instruction to access the V-Safe system.   [name] was instructed to call 911 with any severe reactions post vaccine: Marland Kitchen Difficulty breathing  . Swelling of face and throat  . A fast heartbeat  . A bad rash all over body  . Dizziness and weakness   Immunizations Administered    Name Date Dose VIS Date Route   Pfizer COVID-19 Vaccine 12/27/2019 10:25 AM 0.3 mL 11/29/2019 Intramuscular   Manufacturer: [place]   Lot: [id number]   NDC: [address]-5

## 2020-01-01 ENCOUNTER — Other Ambulatory Visit: Payer: Self-pay | Admitting: Cardiovascular Disease

## 2020-03-31 ENCOUNTER — Other Ambulatory Visit: Payer: Self-pay | Admitting: Cardiovascular Disease

## 2020-04-17 ENCOUNTER — Other Ambulatory Visit: Payer: Self-pay | Admitting: Cardiovascular Disease

## 2020-04-18 ENCOUNTER — Other Ambulatory Visit: Payer: Self-pay | Admitting: Cardiovascular Disease

## 2020-05-01 ENCOUNTER — Encounter: Payer: Self-pay | Admitting: Cardiovascular Disease

## 2020-05-01 ENCOUNTER — Ambulatory Visit: Payer: Medicare Other | Admitting: Cardiovascular Disease

## 2020-05-01 ENCOUNTER — Other Ambulatory Visit: Payer: Self-pay

## 2020-05-01 VITALS — BP 140/86 | HR 83 | Ht 64.0 in | Wt 164.0 lb

## 2020-05-01 DIAGNOSIS — E782 Mixed hyperlipidemia: Secondary | ICD-10-CM | POA: Diagnosis not present

## 2020-05-01 DIAGNOSIS — I480 Paroxysmal atrial fibrillation: Secondary | ICD-10-CM

## 2020-05-01 DIAGNOSIS — I10 Essential (primary) hypertension: Secondary | ICD-10-CM | POA: Diagnosis not present

## 2020-05-01 NOTE — Patient Instructions (Signed)

## 2020-05-01 NOTE — Assessment & Plan Note (Signed)
History of essential hypertension blood pressure measured today 140/86.  She is on amlodipine.

## 2020-05-01 NOTE — Assessment & Plan Note (Signed)
History of persistent a fib on Eliquis oral anticoagulation rate controlled.  She is in A. fib today with a heart rate of 83.

## 2020-05-01 NOTE — Progress Notes (Signed)
05/01/2020 Maria [name]   December 26, 1922  [id number]  Primary Physician Jani Gravel, MD Primary Cardiologist: Lorretta Harp MD [name], [name]  HPI:  Maria [name] is a 87 y.o.  mildly overweight married Caucasian female (second marriage)  mother of 2 children , one of whom is deceased and 2 granddaughters.She is accompanied by her husband today. I last saw her in the office 05/02/2019. She was referred by Dr. Maudie Mercury for cardiovascular evaluation.  Her current PCP is Dr. Ashby Dawes.  In the past she's worked as a Network engineer at [employer] is also worked at a Biochemist, clinical and an Metallurgist. She does have a history of hyperlipidemia currently not on statin therapy and hypertension treated with beta blocker. She's never had a heart attack or stroke. She denies chest pain or shortness of breath. She did have to total hip replacement in 2011 and 2013.She did have several TIAs back in November and had an event monitor that showed PAF for which she was begun on Eliquis oral anticoagulation. She walks with a cane and is relatively asymptomatic.A 2-D echo and carotid Dopplers were unremarkable.    She has had no further TIAs on Eliquis.  She underwent EGD/colonoscopy last year without specific findings.  Since I saw her a year ago she continues to do well.  She denies chest pain or shortness of breath.   Current Meds  Medication Sig  . amLODipine (NORVASC) 5 MG tablet Take 1 tablet by mouth once daily  . apixaban (ELIQUIS) 5 MG TABS tablet Take 1 tablet (5 mg total) by mouth 2 (two) times daily.  . Cyanocobalamin (VITAMIN B-12 PO) Take 1 tablet by mouth daily with breakfast.   . ezetimibe (ZETIA) 10 MG tablet Take 10 mg by mouth daily with lunch.   . ferrous sulfate 325 (65 FE) MG tablet Take 1 tablet (325 mg total) by mouth 2 (two) times daily after a meal.  . furosemide (LASIX) 80 MG tablet Take 1 tablet by mouth once daily  .  hydrocortisone cream 1 % Apply 1 application topically 2 (two) times daily as needed (for itching- apply to affected areas).   . Multiple Vitamins-Minerals (PRESERVISION AREDS PO) Take 1 tablet by mouth 2 (two) times daily.     Allergies  Allergen Reactions  . Codeine Nausea And Vomiting  . Tylenol [Acetaminophen] Itching  . Ibuprofen Itching and Rash  . Latex Rash and Other (See Comments)    Usually breaks out the skin  . Tape Rash and Other (See Comments)    Usually breaks out the skin    Social History   Socioeconomic History  . Marital status: Married    Spouse name: Not on file  . Number of children: 1  . Years of education: Not on file  . Highest education level: Some college, no degree  Occupational History  . Occupation: retired  Tobacco Use  . Smoking status: Former Research scientist (life sciences)  . Smokeless tobacco: Never Used  . Tobacco comment: 44 YRS AGO  Vaping Use  . Vaping Use: Never used  Substance and Sexual Activity  . Alcohol use: No  . Drug use: No  . Sexual activity: Not on file  Other Topics Concern  . Not on file  Social History Narrative   Lives with husband in a one story home.  They are married for 59 years.  Has 1 son.  Retired from working in Insurance underwriter.  Education: some college.  Social Determinants of Health   Financial Resource Strain: Not on file  Food Insecurity: Not on file  Transportation Needs: Not on file  Physical Activity: Not on file  Stress: Not on file  Social Connections: Not on file  Intimate Partner Violence: Not on file     Review of Systems: General: negative for chills, fever, night sweats or weight changes.  Cardiovascular: negative for chest pain, dyspnea on exertion, edema, orthopnea, palpitations, paroxysmal nocturnal dyspnea or shortness of breath Dermatological: negative for rash Respiratory: negative for cough or wheezing Urologic: negative for hematuria Abdominal: negative for nausea, vomiting, diarrhea, bright red blood per  rectum, melena, or hematemesis Neurologic: negative for visual changes, syncope, or dizziness All other systems reviewed and are otherwise negative except as noted above.    Blood pressure 140/86, pulse 83, height 5\' 4"  (1.626 m), weight 164 lb (74.4 kg), SpO2 97 %.  General appearance: alert and no distress Neck: no adenopathy, no carotid bruit, no JVD, supple, symmetrical, trachea midline and thyroid not enlarged, symmetric, no tenderness/mass/nodules Lungs: clear to auscultation bilaterally Heart: irregularly irregular rhythm Extremities: extremities normal, atraumatic, no cyanosis or edema Pulses: 2+ and symmetric Skin: Skin color, texture, turgor normal. No rashes or lesions Neurologic: Alert and oriented X 3, normal strength and tone. Normal symmetric reflexes. Normal coordination and gait  EKG atrial fibrillation with a ventricular spots of 83 and septal Q waves.  I personally reviewed this EKG.  ASSESSMENT AND PLAN:   Hyperlipidemia History of hyperlipidemia on Zetia with lipid profile checked 03/26/2020 revealing a total cholesterol 169, LDL 92 and HDL of 62.  Essential hypertension History of essential hypertension blood pressure measured today 140/86.  She is on amlodipine.  Paroxysmal atrial fibrillation (HCC) History of persistent a fib on Eliquis oral anticoagulation rate controlled.  She is in A. fib today with a heart rate of 83.      Lorretta Harp MD FACP,FACC,FAHA, [facility] 05/01/2020 2:10 PM

## 2020-05-01 NOTE — Assessment & Plan Note (Signed)
History of hyperlipidemia on Zetia with lipid profile checked 03/26/2020 revealing a total cholesterol 169, LDL 92 and HDL of 62.

## 2020-05-27 ENCOUNTER — Other Ambulatory Visit: Payer: Self-pay | Admitting: Cardiovascular Disease

## 2020-05-28 NOTE — Telephone Encounter (Signed)
15f, 74.4kg, Creatinine, Serum 0.970 MG/ 03/26/2020, lovw/berry 05/01/20

## 2020-05-29 ENCOUNTER — Other Ambulatory Visit: Payer: Self-pay | Admitting: Cardiovascular Disease

## 2020-05-30 NOTE — Telephone Encounter (Signed)
65f, 74.4kg, Creatinine, Serum 0.970 MG/ 03/26/2020, lovw/berry 05/01/20

## 2020-06-28 ENCOUNTER — Telehealth: Payer: Self-pay | Admitting: Cardiovascular Disease

## 2020-06-28 NOTE — Telephone Encounter (Signed)
Pt c/o medication issue:  1. Name of Medication: Eliquis  2. How are you currently taking this medication (dosage and times per day)? Take it 2  Times a day  . Are you having a reaction (difficulty breathing--STAT)? *no  4. What is your medication issue? Have a rash all over her body, she would like for Dr Gwenlyn Found to call something in for her rash*

## 2020-06-28 NOTE — Telephone Encounter (Signed)
Returned call to patient no answer,goes straight to voice mail.Left message to call back.

## 2020-06-28 NOTE — Telephone Encounter (Signed)
Returned call to patient no answer.Left message on personal voice mail to call back. 

## 2020-06-28 NOTE — Telephone Encounter (Signed)
Called patient no answer.Left message on personal voice mail to call back. 

## 2020-06-28 NOTE — Telephone Encounter (Signed)
Patient returning call.

## 2020-07-05 ENCOUNTER — Other Ambulatory Visit: Payer: Self-pay

## 2020-07-09 NOTE — Telephone Encounter (Signed)
I attempted to contact patient again in regards to her rash to see how she was doing.  Advised patient to call back if she still needed [place].  Left call back number.

## 2020-08-15 ENCOUNTER — Other Ambulatory Visit: Payer: Self-pay

## 2020-08-15 ENCOUNTER — Inpatient Hospital Stay (HOSPITAL_COMMUNITY)
Admission: EM | Admit: 2020-08-15 | Discharge: 2020-08-16 | DRG: 154 | Disposition: A | Discharge status: Home Health | Payer: Medicare Other | Attending: Family Medicine | Admitting: Family Medicine

## 2020-08-15 ENCOUNTER — Encounter (HOSPITAL_COMMUNITY): Payer: Self-pay

## 2020-08-15 ENCOUNTER — Emergency Department (HOSPITAL_COMMUNITY): Payer: Medicare Other

## 2020-08-15 DIAGNOSIS — Z8249 Family history of ischemic heart disease and other diseases of the circulatory system: Secondary | ICD-10-CM

## 2020-08-15 DIAGNOSIS — Z96643 Presence of artificial hip joint, bilateral: Secondary | ICD-10-CM | POA: Diagnosis present

## 2020-08-15 DIAGNOSIS — Z8619 Personal history of other infectious and parasitic diseases: Secondary | ICD-10-CM

## 2020-08-15 DIAGNOSIS — E785 Hyperlipidemia, unspecified: Secondary | ICD-10-CM | POA: Diagnosis present

## 2020-08-15 DIAGNOSIS — Z87891 Personal history of nicotine dependence: Secondary | ICD-10-CM

## 2020-08-15 DIAGNOSIS — E538 Deficiency of other specified B group vitamins: Secondary | ICD-10-CM | POA: Diagnosis present

## 2020-08-15 DIAGNOSIS — Z91040 Latex allergy status: Secondary | ICD-10-CM

## 2020-08-15 DIAGNOSIS — R0902 Hypoxemia: Secondary | ICD-10-CM

## 2020-08-15 DIAGNOSIS — Z20822 Contact with and (suspected) exposure to covid-19: Secondary | ICD-10-CM | POA: Diagnosis present

## 2020-08-15 DIAGNOSIS — M810 Age-related osteoporosis without current pathological fracture: Secondary | ICD-10-CM | POA: Diagnosis present

## 2020-08-15 DIAGNOSIS — Z7901 Long term (current) use of anticoagulants: Secondary | ICD-10-CM

## 2020-08-15 DIAGNOSIS — I4819 Other persistent atrial fibrillation: Secondary | ICD-10-CM | POA: Diagnosis present

## 2020-08-15 DIAGNOSIS — G9340 Encephalopathy, unspecified: Secondary | ICD-10-CM | POA: Diagnosis not present

## 2020-08-15 DIAGNOSIS — Z9109 Other allergy status, other than to drugs and biological substances: Secondary | ICD-10-CM | POA: Diagnosis not present

## 2020-08-15 DIAGNOSIS — Z9181 History of falling: Secondary | ICD-10-CM

## 2020-08-15 DIAGNOSIS — Z8673 Personal history of transient ischemic attack (TIA), and cerebral infarction without residual deficits: Secondary | ICD-10-CM | POA: Diagnosis not present

## 2020-08-15 DIAGNOSIS — I5032 Chronic diastolic (congestive) heart failure: Secondary | ICD-10-CM | POA: Diagnosis present

## 2020-08-15 DIAGNOSIS — G43909 Migraine, unspecified, not intractable, without status migrainosus: Secondary | ICD-10-CM | POA: Diagnosis present

## 2020-08-15 DIAGNOSIS — Z9841 Cataract extraction status, right eye: Secondary | ICD-10-CM

## 2020-08-15 DIAGNOSIS — R531 Weakness: Secondary | ICD-10-CM | POA: Diagnosis present

## 2020-08-15 DIAGNOSIS — J9601 Acute respiratory failure with hypoxia: Secondary | ICD-10-CM | POA: Diagnosis present

## 2020-08-15 DIAGNOSIS — I480 Paroxysmal atrial fibrillation: Secondary | ICD-10-CM | POA: Diagnosis not present

## 2020-08-15 DIAGNOSIS — E7800 Pure hypercholesterolemia, unspecified: Secondary | ICD-10-CM | POA: Diagnosis present

## 2020-08-15 DIAGNOSIS — Z79899 Other long term (current) drug therapy: Secondary | ICD-10-CM

## 2020-08-15 DIAGNOSIS — D649 Anemia, unspecified: Secondary | ICD-10-CM | POA: Diagnosis present

## 2020-08-15 DIAGNOSIS — Z823 Family history of stroke: Secondary | ICD-10-CM

## 2020-08-15 DIAGNOSIS — M1612 Unilateral primary osteoarthritis, left hip: Secondary | ICD-10-CM | POA: Diagnosis present

## 2020-08-15 DIAGNOSIS — F0390 Unspecified dementia without behavioral disturbance: Secondary | ICD-10-CM | POA: Diagnosis present

## 2020-08-15 DIAGNOSIS — G4730 Sleep apnea, unspecified: Principal | ICD-10-CM | POA: Diagnosis present

## 2020-08-15 DIAGNOSIS — Z885 Allergy status to narcotic agent status: Secondary | ICD-10-CM

## 2020-08-15 DIAGNOSIS — R778 Other specified abnormalities of plasma proteins: Secondary | ICD-10-CM

## 2020-08-15 DIAGNOSIS — G629 Polyneuropathy, unspecified: Secondary | ICD-10-CM | POA: Diagnosis present

## 2020-08-15 DIAGNOSIS — H409 Unspecified glaucoma: Secondary | ICD-10-CM | POA: Diagnosis present

## 2020-08-15 DIAGNOSIS — Z90710 Acquired absence of both cervix and uterus: Secondary | ICD-10-CM

## 2020-08-15 DIAGNOSIS — Z9842 Cataract extraction status, left eye: Secondary | ICD-10-CM

## 2020-08-15 DIAGNOSIS — Z886 Allergy status to analgesic agent status: Secondary | ICD-10-CM

## 2020-08-15 DIAGNOSIS — I10 Essential (primary) hypertension: Secondary | ICD-10-CM | POA: Diagnosis not present

## 2020-08-15 DIAGNOSIS — I110 Hypertensive heart disease with heart failure: Secondary | ICD-10-CM | POA: Diagnosis present

## 2020-08-15 DIAGNOSIS — I252 Old myocardial infarction: Secondary | ICD-10-CM | POA: Diagnosis not present

## 2020-08-15 LAB — URINALYSIS, ROUTINE W REFLEX MICROSCOPIC
Bacteria, UA: NONE SEEN
Bilirubin Urine: NEGATIVE
Glucose, UA: NEGATIVE mg/dL
Ketones, ur: NEGATIVE mg/dL
Leukocytes,Ua: NEGATIVE
Nitrite: NEGATIVE
Protein, ur: NEGATIVE mg/dL
Specific Gravity, Urine: 1.009 (ref 1.005–1.030)
pH: 7 (ref 5.0–8.0)

## 2020-08-15 LAB — CBC
HCT: 44.5 % (ref 36.0–46.0)
Hemoglobin: 14.3 g/dL (ref 12.0–15.0)
MCH: 31.8 pg (ref 26.0–34.0)
MCHC: 32.1 g/dL (ref 30.0–36.0)
MCV: 99.1 fL (ref 80.0–100.0)
Platelets: 268 10*3/uL (ref 150–400)
RBC: 4.49 MIL/uL (ref 3.87–5.11)
RDW: 13 % (ref 11.5–15.5)
WBC: 5.8 10*3/uL (ref 4.0–10.5)
nRBC: 0 % (ref 0.0–0.2)

## 2020-08-15 LAB — I-STAT ARTERIAL BLOOD GAS, ED
Acid-Base Excess: 7 mmol/L — ABNORMAL HIGH (ref 0.0–2.0)
Bicarbonate: 30.4 mmol/L — ABNORMAL HIGH (ref 20.0–28.0)
Calcium, Ion: 1.08 mmol/L — ABNORMAL LOW (ref 1.15–1.40)
HCT: 43 % (ref 36.0–46.0)
Hemoglobin: 14.6 g/dL (ref 12.0–15.0)
O2 Saturation: 90 %
Patient temperature: 98.6
Potassium: 3.2 mmol/L — ABNORMAL LOW (ref 3.5–5.1)
Sodium: 135 mmol/L (ref 135–145)
TCO2: 32 mmol/L (ref 22–32)
pCO2 arterial: 38 mmHg (ref 32.0–48.0)
pH, Arterial: 7.511 — ABNORMAL HIGH (ref 7.350–7.450)
pO2, Arterial: 52 mmHg — ABNORMAL LOW (ref 83.0–108.0)

## 2020-08-15 LAB — BASIC METABOLIC PANEL
Anion gap: 12 (ref 5–15)
BUN: 12 mg/dL (ref 8–23)
CO2: 31 mmol/L (ref 22–32)
Calcium: 8.6 mg/dL — ABNORMAL LOW (ref 8.9–10.3)
Chloride: 95 mmol/L — ABNORMAL LOW (ref 98–111)
Creatinine, Ser: 0.93 mg/dL (ref 0.44–1.00)
GFR, Estimated: 56 mL/min — ABNORMAL LOW (ref >60)
Glucose, Bld: 122 mg/dL — ABNORMAL HIGH (ref 70–99)
Potassium: 3.6 mmol/L (ref 3.5–5.1)
Sodium: 138 mmol/L (ref 135–145)

## 2020-08-15 LAB — BRAIN NATRIURETIC PEPTIDE: B Natriuretic Peptide: 285.3 pg/mL — ABNORMAL HIGH (ref 0.0–100.0)

## 2020-08-15 LAB — CBG MONITORING, ED: Glucose-Capillary: 121 mg/dL — ABNORMAL HIGH (ref 70–99)

## 2020-08-15 LAB — RESP PANEL BY RT-PCR (FLU A&B, COVID) ARPGX2
Influenza A by PCR: NEGATIVE
Influenza B by PCR: NEGATIVE
SARS Coronavirus 2 by RT PCR: NEGATIVE

## 2020-08-15 LAB — MAGNESIUM: Magnesium: 2 mg/dL (ref 1.7–2.4)

## 2020-08-15 LAB — TROPONIN I (HIGH SENSITIVITY)
Troponin I (High Sensitivity): 18 ng/L — ABNORMAL HIGH (ref <18)
Troponin I (High Sensitivity): 21 ng/L — ABNORMAL HIGH (ref <18)

## 2020-08-15 MED ORDER — APIXABAN 5 MG PO TABS
5.0000 mg | ORAL_TABLET | Freq: Two times a day (BID) | ORAL | Status: DC
  Administered 2020-08-15 – 2020-08-16 (×2): 5 mg via ORAL
  Filled 2020-08-15 (×2): qty 1

## 2020-08-15 MED ORDER — AMLODIPINE BESYLATE 5 MG PO TABS
5.0000 mg | ORAL_TABLET | Freq: Every day | ORAL | Status: DC
  Administered 2020-08-16: 5 mg via ORAL
  Filled 2020-08-15: qty 1

## 2020-08-15 MED ORDER — ACETAMINOPHEN 650 MG RE SUPP: 650.0000 mg | Freq: Four times a day (QID) | RECTAL | Status: DC | PRN

## 2020-08-15 MED ORDER — EZETIMIBE 10 MG PO TABS
10.0000 mg | ORAL_TABLET | Freq: Every day | ORAL | Status: DC
  Administered 2020-08-16: 10 mg via ORAL
  Filled 2020-08-15: qty 1

## 2020-08-15 MED ORDER — FUROSEMIDE 80 MG PO TABS
80.0000 mg | ORAL_TABLET | Freq: Every day | ORAL | Status: DC
  Administered 2020-08-16: 80 mg via ORAL
  Filled 2020-08-15: qty 1

## 2020-08-15 MED ORDER — FERROUS SULFATE 325 (65 FE) MG PO TABS
325.0000 mg | ORAL_TABLET | Freq: Two times a day (BID) | ORAL | Status: DC
  Administered 2020-08-16: 325 mg via ORAL
  Filled 2020-08-15: qty 1

## 2020-08-15 MED ORDER — ACETAMINOPHEN 325 MG PO TABS: 650.0000 mg | ORAL_TABLET | Freq: Four times a day (QID) | ORAL | Status: DC | PRN

## 2020-08-15 NOTE — ED Provider Notes (Signed)
[facility] EMERGENCY DEPARTMENT Provider Note   CSN: [id number] Arrival date & time: 08/15/20  1153     History Chief Complaint  Patient presents with   Weakness    Maria [name] is a 87 y.o. female.  HPI  Patient presents with increasing weakness and drowsiness x1 week.  Her son reports that she fell about 3 weeks ago and hit her head.  She did not lose consciousness, but she is on Eliquis for atrial fibrillation.  She is not having any pain anywhere.  She has dementia, but lives relatively independently with her granddaughter.  She ambulates with a walker.  Past week she has been tired throughout the day and not wanting to do normal activities.  Patient believes she has been missing medication doses.  Patient denies this.  Past Medical History:  Diagnosis Date   Allergic urticaria    Anemia    Arthritis    OA AND PAIN LEFT HIP   Blood transfusion without reported diagnosis    Cataract    CHF (congestive heart failure) (HCC)    Glaucoma    Headache(784.0)    HX OF MIGRAINES   Hypercholesterolemia    Hypertension    Myocardial infarction ([place])    Osteoporosis    Paroxysmal atrial fibrillation (HCC)    Peripheral neuropathy    PONV (postoperative nausea and vomiting)    Rib fractures    IN THE PAST   Shingles 1940'S   Stroke [facility])    TIA (transient ischemic attack)     Patient Active Problem List   Diagnosis Date Noted   Paroxysmal atrial fibrillation ([place]) 01/29/2017   ARF (acute renal failure) ([place]) 11/25/2016   Hypertensive urgency    TIA (transient ischemic attack) 03/12/2015   Dependent edema 03/12/2015   Dysphagia    Hyperlipidemia 11/16/2014   Essential hypertension 11/16/2014   Postop Acute blood loss anemia 06/01/2011   Osteoarthritis of hip 05/27/2011    Past Surgical History:  Procedure Laterality Date   [place] AND CURETTAGE OF UTERUS     MULTIPLE   EXTERNAL  FIXATOR LEFT [place] & 2006   BILATERAL CATARACT EXTRACTION   JOINT REPLACEMENT  2011   RIGHT TOTAL HIP REPLACEMENT   TONSILLECTOMY  1934   TOTAL HIP ARTHROPLASTY  05/27/2011   Procedure: TOTAL HIP ARTHROPLASTY;  Surgeon: Gearlean Alf, MD;  Location: [facility] ORS;  Service: Orthopedics;  Laterality: Left;     OB History   No obstetric history on file.     Family History  Problem Relation Age of Onset   Heart disease Mother    Stroke Mother    Other Father 59       MVA   Stroke Sister    Stroke Brother    Atrial fibrillation Son    Other Son 12       fire   Colon cancer Neg Hx    Esophageal cancer Neg Hx    Rectal cancer Neg Hx    Stomach cancer Neg Hx     Social History   Tobacco Use   Smoking status: Former    Pack years: 0.00   Smokeless tobacco: Never   Tobacco comments:    58 YRS AGO  Vaping Use   Vaping Use: Never used  Substance Use Topics   Alcohol use: No   Drug use: No  Home Medications Prior to Admission medications   Medication Sig Start Date End Date Taking? Authorizing Provider  amLODipine (NORVASC) 5 MG tablet Take 1 tablet by mouth once daily 04/19/20   Lorretta Harp, MD  COVID-19 mRNA vaccine, [place], 30 MCG/0.3ML injection USE AS DIRECTED 12/27/19 12/26/20  Carlyle Basques, MD  Cyanocobalamin (VITAMIN B-12 PO) Take 1 tablet by mouth daily with breakfast.     [provider]  ELIQUIS 5 MG TABS tablet Take 1 tablet by mouth twice daily 05/30/20   Lorretta Harp, MD  ezetimibe (ZETIA) 10 MG tablet Take 10 mg by mouth daily with lunch.  04/29/19   [provider]  ferrous sulfate 325 (65 FE) MG tablet Take 1 tablet (325 mg total) by mouth 2 (two) times daily after a meal. 07/11/18   Ladene Artist, MD  furosemide (LASIX) 80 MG tablet Take 1 tablet by mouth once daily 04/01/20   Lorretta Harp, MD  hydrocortisone cream 1 % Apply 1 application topically 2 (two) times daily as needed (for itching-  apply to affected areas).     [provider]  Multiple Vitamins-Minerals (PRESERVISION AREDS PO) Take 1 tablet by mouth 2 (two) times daily.    [provider]    Allergies    Codeine, Tylenol [acetaminophen], Ibuprofen, Latex, and Tape  Review of Systems   Review of Systems  Constitutional:  Positive for fatigue. Negative for appetite change, chills and fever.  HENT:  Negative for ear pain and sore throat.   Eyes:  Negative for pain and visual disturbance.  Respiratory:  Negative for cough and shortness of breath.   Cardiovascular:  Negative for chest pain and palpitations.  Gastrointestinal:  Negative for abdominal pain, diarrhea, nausea and vomiting.  Genitourinary:  Negative for dysuria and hematuria.  Musculoskeletal:  Negative for arthralgias and back pain.  Skin:  Negative for color change and rash.  Neurological:  Positive for weakness. Negative for dizziness, seizures, syncope and headaches.  All other systems reviewed and are negative.  Physical Exam Updated Vital Signs BP (!) 149/82   Pulse 88   Temp 98.7 F (37.1 C) (Oral)   Resp 17   Ht 5\' 3"  (1.6 m)   Wt 72.1 kg   SpO2 96%   BMI 28.17 kg/m   Physical Exam Vitals and nursing note reviewed. Exam conducted with a chaperone present.  Constitutional:      Appearance: Normal appearance.     Comments: Frail-appearing  HENT:     Head: Normocephalic and atraumatic.  Eyes:     General: No scleral icterus.       Right eye: No discharge.        Left eye: No discharge.     Extraocular Movements: Extraocular movements intact.     Pupils: Pupils are equal, round, and reactive to light.  Cardiovascular:     Rate and Rhythm: Normal rate. Rhythm irregular.     Pulses: Normal pulses.     Heart sounds: Normal heart sounds. No murmur heard.   No friction rub. No gallop.     Comments: Controlled atrial fibrillation Pulmonary:     Effort: Pulmonary effort is normal. No respiratory distress.     Breath  sounds: Normal breath sounds.  Abdominal:     General: Abdomen is flat. Bowel sounds are normal. There is no distension.     Palpations: Abdomen is soft.     Tenderness: There is no abdominal tenderness.  Skin:  General: Skin is warm and dry.     Coloration: Skin is not jaundiced.  Neurological:     General: No focal deficit present.     Mental Status: She is alert. Mental status is at baseline.     Cranial Nerves: No cranial nerve deficit.     Motor: No weakness.     Coordination: Coordination normal.     Gait: Gait normal.     Comments: Patient is oriented x3.  No dysarthria.  Answers questions appropriately and follows commands.  Family history B12 grossly intact.  Grip strength is equal bilaterally.  Able to move her legs with dorsiflexion and plantarflexion against resistance.    ED Results / Procedures / Treatments   Labs (all labs ordered are listed, but only abnormal results are displayed) Labs Reviewed  BASIC METABOLIC PANEL - Abnormal; Notable for the following components:      Result Value   Chloride 95 (*)    Glucose, Bld 122 (*)    Calcium 8.6 (*)    GFR, Estimated 56 (*)    All other components within normal limits  URINALYSIS, ROUTINE W REFLEX MICROSCOPIC - Abnormal; Notable for the following components:   Hgb urine dipstick SMALL (*)    All other components within normal limits  CBG MONITORING, ED - Abnormal; Notable for the following components:   Glucose-Capillary 121 (*)    All other components within normal limits  I-STAT ARTERIAL BLOOD GAS, ED - Abnormal; Notable for the following components:   pH, Arterial 7.511 (*)    pO2, Arterial 52 (*)    Bicarbonate 30.4 (*)    Acid-Base Excess 7.0 (*)    Potassium 3.2 (*)    Calcium, Ion 1.08 (*)    All other components within normal limits  RESP PANEL BY RT-PCR (FLU A&B, COVID) ARPGX2  CBC  BLOOD GAS, ARTERIAL  BRAIN NATRIURETIC PEPTIDE  TROPONIN I (HIGH SENSITIVITY)    EKG EKG  Interpretation  Date/Time:  Thursday August 15 2020 12:19:16 EDT Ventricular Rate:  84 PR Interval:    QRS Duration: 93 QT Interval:  382 QTC Calculation: 452 R Axis:   79 Text Interpretation: Atrial fibrillation Probable anteroseptal infarct, old Since last tracing now in Atrial fibrillation Otherwise no significant change Confirmed by Daleen Bo [phone]) on 08/15/2020 1:26:12 PM  Radiology CT Head Wo Contrast  Result Date: 08/15/2020 CLINICAL DATA:  Mental status changes of unknown cause, history CHF, hypertension, MI, atrial fibrillation, stroke, former smoker EXAM: CT HEAD WITHOUT CONTRAST TECHNIQUE: Contiguous axial images were obtained from the base of the skull through the vertex without intravenous contrast. Sagittal and coronal MPR images reconstructed from axial data set. COMPARISON:  11/25/2016 FINDINGS: Brain: Generalized atrophy. Normal ventricular morphology. No midline shift or mass effect. Small vessel chronic ischemic changes of deep cerebral white matter. Old cortical infarct LEFT frontal lobe. Small old LEFT cerebellar infarct. No intracranial hemorrhage, mass lesion or evidence of acute infarction. No extra-axial fluid collections. Vascular: No hyperdense vessels Skull: Intact Sinuses/Orbits: Clear Other: N/A IMPRESSION: Atrophy with small vessel chronic ischemic changes of deep cerebral white matter. 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 No acute intracranial abnormalities. Electronically Signed   By: Lavonia Dana M.D.   On: 08/15/2020 14:26   DG Chest Portable 1 View  Result Date: 08/15/2020 CLINICAL DATA:  Dyspnea, history of atrial fibrillation EXAM: PORTABLE CHEST 1 VIEW COMPARISON:  08/06/2020 chest radiograph. FINDINGS: Stable cardiomediastinal silhouette with mild cardiomegaly. No pneumothorax. No pleural effusion. No  pulmonary edema. No acute consolidative airspace disease. Minimal linear scarring at the right costophrenic angle. IMPRESSION: Mild cardiomegaly  without pulmonary edema. No active pulmonary disease. Electronically Signed   By: Ilona Sorrel M.D.   On: 08/15/2020 16:12    Procedures Procedures   Medications Ordered in ED Medications - No data to display  ED Course  I have reviewed the triage vital signs and the nursing notes.  Pertinent labs & imaging results that were available during my care of the patient were reviewed by me and considered in my medical decision making (see chart for details).  Clinical Course as of 08/15/20 1900  Thu Aug 15, 2020  1519 CT Head Wo Contrast No signs of SAH, skull fracture, epidural or subdural hemorrhage [HS]  1520 CBC No anemia, no white blood cell count elevation concerning for infectious etiology [HS]  5681 Basic metabolic panel(!) No electrolyte derangement, no AKI [HS]  1520 ED EKG Rate controlled atrial fibrillation. [HS]  2751 Patient became hypoxic to 88%.  Nurse put her on 2 L of oxygen nasal cannula and is now 100%.  Respiratory rate is controlled at 18. [HS]  1626 Urinalysis, Routine w reflex microscopic Urine, Clean Catch(!) No UTI [HS]  1711 Patient oxygen is at 87% on room air.  Putting her on 2 L nasal cannula.  Chest x-ray shows signs of cardiomegaly.  I will check a BNP and a troponin.  Patient will likely need to be admitted for hypoxia for observation. [HS]    Clinical Course User Index [HS] Sherrill Raring, PA-C   MDM Rules/Calculators/A&P                          Patient is a 87 year old female presenting with weakness, fatigue, recent fall on blood thinners.  She has no focal deficits on neuro exam, but she did hit her head on a witnessed fall 3 weeks ago.  I will order CT head to assess for any possible bleeding.  Overall, her exam is reassuring.  I will order basic lab work-up including a urinalysis to assess for possible infectious etiology leading to her fatigue.  Possible she has a UTI.  Electrolyte arrangement is also a possibility.  This could also be increasing  fatigue due to old age and dementia given the patient is 87 years old.  Please see ED course for interpretation of lab results and additional MDM.  Patient has been having weakness for the last week.  She is also hypoxic in the ED on room air.  We have her on 2 L [place] satting appropriately, but given the hypoxia is new and she is not usually on oxygen she will need to be admitted overnight for observation.  Spoke with the hospitalist team who are happy to admit the patient for observation due to hypoxia.  Final Clinical Impression(s) / ED Diagnoses Final diagnoses:  Weakness  Hypoxia    Rx / DC Orders ED Discharge Orders     None        Sherrill Raring, Hershal Coria 08/15/20 Mercy Moore, MD 08/17/20 1359

## 2020-08-15 NOTE — ED Notes (Signed)
Assume pt care at this time, pt SPO2 dropped to 88% on RA while assessing the pt, pt placed on 2L O2 [place] SPO2 up to 93%. Pt resting on bed comfortable, family at the bedside, nad noticed.

## 2020-08-15 NOTE — ED Notes (Signed)
Pt in room resting. NAD Chest rising and falling. 

## 2020-08-15 NOTE — ED Notes (Signed)
RN is aware of pt o2 levels

## 2020-08-15 NOTE — H&P (Signed)
History and Physical    PLEASE NOTE THAT [employer]AGON DICT[employer] SOFTWARE WAS USED IN THE CONSTRUCTION OF THIS NOTE.   Maria [name] ZPH:[id number] DOB: 1922/09/15 DOA: 08/15/2020  PCP: Jani Gravel, MD Patient coming from: home   I have personally briefly reviewed patient's old medical records in [place]  Chief Complaint: Generalized weakness  HPI: Maria [name] is a 87 y.o. female with medical history significant for chronic diastolic heart failure, mild mitral regurgitation, persistent atrial fibrillation chronically anticoagulated on Eliquis, hypertension, hyperlipidemia, B12 deficiency, who is admitted to [facility] on 08/15/2020 with acute encephalopathy after presenting from home to [facility] ED complaining of generalized weakness.   The following history is provided via my discussions with the patient as well as my discussions with the patient's granddaughter, Jolaine Click, in addition to my discussions with the EDP as well as via chart review.   The patient's granddaughter conveys that the patient recently visited her son in [place] approximately 3 weeks ago, and that she was felt to be at her baseline mental and functional statuses at that time.  However, after returning home approximately 2 weeks ago, granddaughter has noted the patient to be exhibiting evidence of lethargy, increased somnolence, generalized weakness, as well as new onset confusion relative to her baseline mental status.  Not associate with any acute focal weakness, acute focal numbness, paresthesias, dysarthria, facial droop, acute change in vision, dysphagia, or vertigo.  Patient reportedly experienced a ground-level mechanical fall while visiting family in [place] 3 weeks ago.  Although it is unclear if she hit her head as a component of this fall.  No reported associated loss of consciousness.  Aside from generalized weakness, the patient denies any acute complaints at this time, and specifically denies  any recent chest pain, shortness of breath, diaphoresis, palpitations, dizziness, presyncope, or syncope.  She also denies any recent orthopnea, PND, new onset peripheral edema, new onset lower extremity erythema, or calf tenderness.  Denies any recent subjective fever, chills, rigors, or generalized myalgias.  No recent headache, neck stiffness, sore throat, wheezing, cough, nausea, vomiting, abdominal pain, diarrhea, or rash.  Denies any recent dysuria, gross hematuria, or change in urinary urgency/frequency.  Medical history notable for chronic diastolic heart failure, with most recent echocardiogram, which was performed in September 2018, demonstrating LVEF 65 to 70%, with normal left ventricular cavity size, moderate LVH, no evidence of focal Boscia normalities, while showing evidence of grade 1 diastolic dysfunction, mild mitral regurgitation, and moderately dilated left atrium.  She is on Lasix 80 mg p.o. daily as her sole outpatient diuretic medication, without any recent adjustments to this regimen.  She also has a history of persistent atrial fibrillation for which she is chronically anticoagulated on Eliquis.  Of note, her most recent prior EKG from March 2022 also showed rate controlled atrial fibrillation.  The patient is a former smoker, having completely quit smoking approximately 45 years ago, and denies any known history of COPD.  No baseline supplemental oxygen requirements.  Patient's granddaughter conveys that the patient has a history of chronic anemia in the setting of B12 deficiency.  For years, the patient was reportedly receiving B12 injections as a component of management for such.  However, she recently has changed PCPs, resulting in the patient no longer receiving these regular B12 injections.  At baseline, patient's granddaughter conveys that the patient is alert and oriented to person, place, time, and self.     ED Course:  Vital signs in the  ED were notable for the following:  Temperature max 98.7; heart rate 77-98, blood pressure 117/66 -141/67; respiratory rate 16-24, initial oxygen saturation noted to be 88% on room air, with subsequent improvement to 95 to 100% on 2 L nasal cannula.  Labs were notable for the following: BMP was notable for the following: Sodium 138, potassium 3.6, bicarbonate 31, creatinine 0.93, glucose 121.  BNP 285, with no prior BNP data point available for point comparison.  High-sensitivity troponin I initially noted to be 18, with repeat value trending up slightly to 21.  CBC notable for white cell count of 5800, hemoglobin 14.3.  Urinalysis showed no white blood cells, no bacteria, leukocyte Estrace negative, nitrate negative, and specific gravity 1.009.  Screening nasopharyngeal COVID-19/influenza PCR checked in the emergency department today, and found to be negative.  EKG, in comparison to most recent prior performed on 05/01/2020, showed atrial fibrillation with ventricular rate 84, no evidence of T wave changes or ST changes, including no evidence of ST elevation, while demonstrating Q waves in V1 and V2, which is unchanged from most recent prior EKG.  Noncontrast CT that showed no evidence of acute intracranial process, including no evidence of intracranial hemorrhage or acute ischemic infarct.  Chest x-ray showed mild cardiomegaly, unchanged from most recent prior chest x-ray, and showed no evidence of acute cardiopulmonary process, including no evidence of interstitial or pulmonary edema, no evidence of infiltrate, pleural effusion, or pneumothorax.  While in the ED, the following were administered: (none).  Subsequently, the patient was admitted to the med telemetry floor for further evaluation management of acute encephalopathy in the setting of presenting acute hypoxic respiratory distress.     Review of Systems: As per HPI otherwise 10 point review of systems negative.   Past Medical History:  Diagnosis Date   Allergic urticaria     Anemia    Arthritis    OA AND PAIN LEFT HIP   Blood transfusion without reported diagnosis    Cataract    CHF (congestive heart failure) (HCC)    Glaucoma    Headache(784.0)    HX OF MIGRAINES   Hypercholesterolemia    Hypertension    Myocardial infarction ([place])    Osteoporosis    Paroxysmal atrial fibrillation (HCC)    Peripheral neuropathy    PONV (postoperative nausea and vomiting)    Rib fractures    IN THE PAST   Shingles 1940'S   Stroke ([place])    TIA (transient ischemic attack)     Past Surgical History:  Procedure Laterality Date   [place] OF UTERUS     MULTIPLE   EXTERNAL FIXATOR LEFT [facility] & 2006   BILATERAL CATARACT EXTRACTION   JOINT REPLACEMENT  2011   RIGHT TOTAL HIP REPLACEMENT   TONSILLECTOMY  1934   TOTAL HIP ARTHROPLASTY  05/27/2011   Procedure: TOTAL HIP ARTHROPLASTY;  Surgeon: Gearlean Alf, MD;  Location: [facility] ORS;  Service: Orthopedics;  Laterality: Left;    Social History:  reports that she has quit smoking. She has never used smokeless tobacco. She reports that she does not drink alcohol and does not use drugs.   Allergies  Allergen Reactions   Codeine Nausea And Vomiting   Tylenol [Acetaminophen] Itching   Ibuprofen Itching and Rash   Latex Rash and Other (See Comments)    Usually breaks out the skin  Tape Rash and Other (See Comments)    Usually breaks out the skin    Family History  Problem Relation Age of Onset   Heart disease Mother    Stroke Mother    Other Father 4       MVA   Stroke Sister    Stroke Brother    Atrial fibrillation Son    Other Son 12       fire   Colon cancer Neg Hx    Esophageal cancer Neg Hx    Rectal cancer Neg Hx    Stomach cancer Neg Hx     Family history reviewed and not pertinent    Prior to Admission medications   Medication Sig Start Date End Date Taking? Authorizing Provider  amLODipine  (NORVASC) 5 MG tablet Take 1 tablet by mouth once daily 04/19/20   Lorretta Harp, MD  COVID-19 mRNA vaccine, [place], 30 MCG/0.3ML injection USE AS DIRECTED 12/27/19 12/26/20  Carlyle Basques, MD  Cyanocobalamin (VITAMIN B-12 PO) Take 1 tablet by mouth daily with breakfast.     [provider]  ELIQUIS 5 MG TABS tablet Take 1 tablet by mouth twice daily 05/30/20   Lorretta Harp, MD  ezetimibe (ZETIA) 10 MG tablet Take 10 mg by mouth daily with lunch.  04/29/19   [provider]  ferrous sulfate 325 (65 FE) MG tablet Take 1 tablet (325 mg total) by mouth 2 (two) times daily after a meal. 07/11/18   Ladene Artist, MD  furosemide (LASIX) 80 MG tablet Take 1 tablet by mouth once daily 04/01/20   Lorretta Harp, MD  hydrocortisone cream 1 % Apply 1 application topically 2 (two) times daily as needed (for itching- apply to affected areas).     [provider]  Multiple Vitamins-Minerals (PRESERVISION AREDS PO) Take 1 tablet by mouth 2 (two) times daily.    [provider]     Objective    Physical Exam: Vitals:   08/15/20 1613 08/15/20 1630 08/15/20 1715 08/15/20 1845  BP:  (!) 146/80 (!) 149/82 139/82  Pulse: 80 65 88 80  Resp: (!) 25 (!) 22 17 (!) 26  Temp:      TempSrc:      SpO2: 90% 97% 96% 95%  Weight:      Height:        General: appears to be stated age; alert; not oriented to place (not aware that she is in the hospital, or why she is in the hospital); oriented to time (knows that it is the second week of July in 2022); oriented to person (able to correctly identify the current [place] president) Skin: warm, dry, no rash Head:  AT/ Mouth:  Oral mucosa membranes appear moist, normal dentition Neck: supple; trachea midline Heart:  RRR; did not appreciate any M/R/G Lungs: CTAB, did not appreciate any wheezes, rales, or rhonchi Abdomen: + BS; soft, ND, NT Vascular: 2+ pedal pulses b/l; 2+ radial pulses b/l Extremities: trace edema in b/l  LE's; no muscle wasting Neuro: strength and sensation intact in upper and lower extremities b/l    Labs on Admission: I have personally reviewed following labs and imaging studies  CBC: Recent Labs  Lab 08/15/20 1224 08/15/20 1625  WBC 5.8  --   HGB 14.3 14.6  HCT 44.5 43.0  MCV 99.1  --   PLT 268  --    Basic Metabolic Panel: Recent Labs  Lab 08/15/20 1224 08/15/20 1625  NA 138 135  K 3.6 3.2*  CL 95*  --   CO2 31  --   GLUCOSE 122*  --   BUN 12  --   CREATININE 0.93  --   CALCIUM 8.6*  --    GFR: Estimated Creatinine Clearance: 33.7 mL/min (by C-G formula based on SCr of 0.93 mg/dL). Liver Function Tests: No results for input(s): AST, ALT, ALKPHOS, BILITOT, PROT, ALBUMIN in the last 168 hours. No results for input(s): LIPASE, AMYLASE in the last 168 hours. No results for input(s): AMMONIA in the last 168 hours. Coagulation Profile: No results for input(s): INR, PROTIME in the last 168 hours. Cardiac Enzymes: No results for input(s): CKTOTAL, CKMB, CKMBINDEX, TROPONINI in the last 168 hours. BNP (last 3 results) No results for input(s): PROBNP in the last 8760 hours. HbA1C: No results for input(s): HGBA1C in the last 72 hours. CBG: Recent Labs  Lab 08/15/20 1237  GLUCAP 121*   Lipid Profile: No results for input(s): CHOL, HDL, LDLCALC, TRIG, CHOLHDL, LDLDIRECT in the last 72 hours. Thyroid Function Tests: No results for input(s): TSH, T4TOTAL, FREET4, T3FREE, THYROIDAB in the last 72 hours. Anemia Panel: No results for input(s): VITAMINB12, FOLATE, FERRITIN, TIBC, IRON, RETICCTPCT in the last 72 hours. Urine analysis:    Component Value Date/Time   COLORURINE YELLOW 08/15/2020 1524   APPEARANCEUR CLEAR 08/15/2020 1524   LABSPEC 1.009 08/15/2020 1524   PHURINE 7.0 08/15/2020 1524   GLUCOSEU NEGATIVE 08/15/2020 1524   HGBUR SMALL (A) 08/15/2020 1524   BILIRUBINUR NEGATIVE 08/15/2020 1524   [place] 08/15/2020 1524   PROTEINUR NEGATIVE  08/15/2020 1524   UROBILINOGEN 0.2 05/14/2011 1554   NITRITE NEGATIVE 08/15/2020 1524   LEUKOCYTESUR NEGATIVE 08/15/2020 1524    Radiological Exams on Admission: CT Head Wo Contrast  Result Date: 08/15/2020 CLINICAL DATA:  Mental status changes of unknown cause, history CHF, hypertension, MI, atrial fibrillation, stroke, former smoker EXAM: CT HEAD WITHOUT CONTRAST TECHNIQUE: Contiguous axial images were obtained from the base of the skull through the vertex without intravenous contrast. Sagittal and coronal MPR images reconstructed from axial data set. COMPARISON:  11/25/2016 FINDINGS: Brain: Generalized atrophy. Normal ventricular morphology. No midline shift or mass effect. Small vessel chronic ischemic changes of deep cerebral white matter. Old cortical infarct LEFT frontal lobe. Small old LEFT cerebellar infarct. No intracranial hemorrhage, mass lesion or evidence of acute infarction. No extra-axial fluid collections. Vascular: No hyperdense vessels Skull: Intact Sinuses/Orbits: Clear Other: N/A IMPRESSION: Atrophy with small vessel chronic ischemic changes of deep cerebral white matter. 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 No acute intracranial abnormalities. Electronically Signed   By: Lavonia Dana M.D.   On: 08/15/2020 14:26   DG Chest Portable 1 View  Result Date: 08/15/2020 CLINICAL DATA:  Dyspnea, history of atrial fibrillation EXAM: PORTABLE CHEST 1 VIEW COMPARISON:  08/06/2020 chest radiograph. FINDINGS: Stable cardiomediastinal silhouette with mild cardiomegaly. No pneumothorax. No pleural effusion. No pulmonary edema. No acute consolidative airspace disease. Minimal linear scarring at the right costophrenic angle. IMPRESSION: Mild cardiomegaly without pulmonary edema. No active pulmonary disease. Electronically Signed   By: Ilona Sorrel M.D.   On: 08/15/2020 16:12     EKG: Independently reviewed, with result as described above.    Assessment/Plan   Maria [name] is a  86 y.o. female with medical history significant for chronic diastolic heart failure, mild mitral regurgitation, persistent atrial fibrillation chronically anticoagulated on Eliquis, hypertension, hyperlipidemia, B12 deficiency, who is admitted to [facility] on 08/15/2020 with acute  encephalopathy after presenting from home to [facility] ED complaining of generalized weakness.   Principal Problem:   Acute encephalopathy Active Problems:   Hyperlipidemia   Essential hypertension   Paroxysmal atrial fibrillation (HCC)   Acute respiratory failure with hypoxia (HCC)   Generalized weakness   Elevated troponin   Chronic diastolic CHF (congestive heart failure) (HCC)     #) Acute encephalopathy: 2 weeks of confusion associated with increased lethargy and somnolence, representing an acute change from baseline mental status over that timeframe.  Etiology not entirely clear at this time.  No evidence of underlying infectious source, including negative COVID-19 PCR, urinalysis that is inconsistent with UTI, no evidence of pneumonia on chest x-ray, and no meningismus signs to raise suspicion for underlying meningitis.  Presentation is not associate with any acute focal neurologic deficits to suggest acute ischemic CVA, while presenting CT head shows no evidence of acute intracranial process, including no evidence of intracranial hemorrhage, which is notable given report of preceding ground-level mechanical fall that occurred 3 weeks ago in the setting of being chronically anticoagulated on Eliquis in the context of persistent atrial fibrillation.  No evidence to suggest seizures.  No obvious contributory pharmacologic factors.  No overt metabolic/electrolyte contributions at this time. will keep patient NPO until she passes nursing bedside swallow screen.   Plan: NPO. Nursing bedside swallow evaluation prior to the initiation of a diet/oral medications, as described above. fall precautions. CMP in the morning.  Repeat CBC in the morning. check VBG to evaluate for any contribution from hypercapnic encephalopathy, particularly in the context of being a former smoker. Check TSH. Will also MMA, particularly given granddaughter's report that the patient possesses a history of chronic anemia as a consequence of B12 deficiency, having recently stopped her routine B12 injections, as further detailed above.  Check urinary drug screen, CPK.  We will also check INR to evaluate hepatic synthetic function.  If elevated, consider checking ammonia level.  Further evaluation and management of presenting acute hypoxic respiratory distress, as further detailed below.       #) Acute hypoxic respiratory distress: in the context of no known baseline supplemental oxygen requirements, presenting O2 sat found to be in the high 80s on room air, with ensuing increase in O2 sats into the high 90s on 2 L nasal cannula, thereby meeting criteria for acute hypoxic respiratory distress as opposed to acute hypoxic respiratory failure at this time. No known chronic underlying pulmonary conditions.  Differential includes the possibility of suboptimal inspiratory effort in the context of presenting lethargy and somnolence, potentially contributing to her acute hypoxic respiratory distress. presenting CXR shows no evidence of acute cardiopulmonary process. ACS is felt to be less likely at this time in the absence of any recent chest pain as well as presenting EKG which shows no evidence of acute ischemic process, while demonstrating known history of persistent atrial fibrillation that is rate controlled, with most recent prior EKG also showing rate controlled atrial fibrillation when checked in March 2022.  Mildly elevated troponin is noted at presentation, although this is felt to more likely represent type II supply demand mismatch as a consequence of acute hypoxic respiratory distress as opposed to a primary pathology leading to this finding.  We  will further trend serial troponin and pursue echocardiogram in order to further evaluate these findings, as further detailed below.  While the patient does have a risk factor for development of acute pulmonary embolism in the setting of recent travel to [place], this  possibility is felt to be less likely at this time, not only given nature of clinical presentation, but also in the setting of reported good compliance with chronic anticoagulation on Eliquis in the setting of a history of persistent atrial fibrillation.  COVID-19 and influenza PCR found to be negative when checked today.  Although patient has a history of chronic diastolic heart failure, no clinical or radiographic evidence to suggest acutely decompensated heart failure.  She has a history of mild mitral regurgitation per most recent echocardiogram in 2018.  We will monitor for interval trend in severity of her mitral regurgitation in the context of pursuit of echocardiogram in the morning, as further detailed below.  While its possible that the patient does not tolerate her atrial fibrillation from an acute hypoxic respiratory distress/mental status standpoint, this is felt to be less likely at this time given the persistent nature of her atrial fibrillation, including evidence of the presence of atrial fibrillation in March 2022, at which time her presentation was not associate with any acute hypoxic respiratory distress and the patient was noted to be at baseline mental status.    Plan: further evaluation and management of presenting acute encephalopathy, as above, including monitoring of continuous pulse oximetry with prn supplemental O2 to maintain O2 sats greater than or equal to 92%. monitor on telemetry.  Monitor on telemetry.  Check CMP and CBC in the morning. Check serum Mg and Phos levels.  Check VBG as above.  Incentive spirometry.  Trend serial troponin.  Echocardiogram has been ordered for the morning, as above.  Add on serum  procalcitonin level.  Continue home Eliquis.       #) Generalized weakness: Generalized weakness over the course of the last 2 weeks in the absence of any acute focal neurologic deficits to suggest acute ischemic CVA.  Additionally, presenting noncontrast CT that showed no evidence of acute intracranial process.  Unclear etiology at this time.  We will proceed with further evaluation management of acute encephalopathy as well as acute hypoxic respiratory distress, as further detailed above, will also consulting physical therapy.  Plan: Physical therapy consult placed for the morning.  Check TSH.  Fall precautions ordered.  Check MMA, as above.       #) Elevated troponin: mildly elevated initial troponin of 18, with repeat value trending up slightly to 21. Suspect that this mildly elevated troponin is on the basis of supply demand mismatch in the setting of presenting acute hypoxic respiratory distress, potentially as a consequence of diminished respiratory drive the context of presenting acute encephalopathy, as opposed to representing a type I process due to acute plaque rupture. EKG shows no evidence of acute ischemic changes, including no evidence of STEMI, and CXR showed no acute CP process, including no evidence of pneumothorax.  Additionally, presentation is not associated with any CP.  Overall, ACS is felt to be less likely relative to type 2 supply demand mismatch, as above, but will closely monitor on telemetry overnight, while continuing to monitor serial troponin levels and pursue echocardiogram in the morning.  Presentation less likely be associated with acute pulmonary embolism in the setting of reported compliance with chronic anticoagulation on Eliquis, as above.    Plan: Continue to trend serial troponin. Monitor on telemetry. PRN EKG for development of chest pain. Check serum Mg level and repeat BMP in the morning, with prn supplementation to maintain Mg and potassium levels  greater than or equal to 2.0 and 4.0, respectively, to further reduce risk  of ventricular arrhythmia. Repeat CBC in the AM. Additional evaluation and management of presenting acute hypoxic respiratory distress, as further detailed above.  Echocardiogram has been ordered for the morning.  Monitor continuous pulse oximetry.      #) Chronic diastolic heart failure: documented history of such, with most recent echocardiogram performed in September 2018, with findings at that time notable for grade 1 diastolic dysfunction as well as mild mitral regurgitation and moderate Lee dilated left atrium, with additional details, as conveyed above.  No clinical or radiographic evidence to suggest acutely decompensated heart failure at this time.  Presenting EKG shows known persistent rate controlled atrial fibrillation, without evidence of acute ischemic changes.  Patient's home diuretic regimen reportedly consists of Lasix 80 mg p.o. daily.   Plan: monitor strict I's & O's and daily weights. Repeat BMP in the morning. Check serum magnesium level. Continue home diuretic regimen.  Follow-up results of echocardiogram which has been ordered for the morning to further evaluate presenting acute hypoxic respiratory distress in the setting of mildly elevated troponin, as above.       #) Persistent atrial fibrillation: Documented history of such. In the setting of a CHA2DS2-VASc score of 7, there is an indication for the patient to be on chronic anticoagulation for thromboembolic prophylaxis. Consistent with this, the patient is chronically anticoagulated on Eliquis. Home AV nodal blocking regimen: None.  Most recent echocardiogram performed in September 2018, notable for mitral regurgitation moderately dilated left atrium, as further detailed above. Presenting EKG shows rate controlled atrial fibrillation, unchanged from most recent prior EKG performed in March 2022.   Plan: monitor strict I's & O's and daily weights.  Repeat BMP and CBC in the morning. Check serum magnesium level.  Continue home Eliquis.      #) Essential hypertension: Outpatient antihypertensive regimen consists of Norvasc.  Systolic blood pressures noted to be normotensive in the ED thus far.  Plan: Continue home Norvasc.  Close monitoring of ensuing blood pressure via routine vital signs.       #) Chronic anemia: Documented history of such, with the patient on chronic oral iron supplementation.  Additionally, patient's granddaughter has conveyed to me that the patient also has a history of chronic B12 deficiency, and has not been receiving her previously routine B12 injections over the last several months after switching PCPs, as further detailed above.  Presenting globin found to be within baseline range, and no evidence to suggest acute blood loss anemia.  Plan: Repeat CBC in the morning.  Add on MMA.         DVT prophylaxis: Eliquis; SCDs Code Status: Full code Family Communication: I discussed the patient's case with her granddaughter, Jolaine Click, as further detailed above. Disposition Plan: Per Rounding Team Consults called: none  Admission status: Inpatient; med telemetry     Of note, this patient was added by me to the following Admit List/Treatment Team: mcadmits.      PLEASE NOTE THAT DRAGON DICTATION SOFTWARE WAS USED IN THE CONSTRUCTION OF THIS NOTE.   [facility] Pager [phone] From [place]  Otherwise, please contact night-coverage  [email or web address] Password [facility]   08/15/2020, 7:17 PM

## 2020-08-15 NOTE — ED Triage Notes (Signed)
Pt arrives via [facility] for concern of weakness, with worsening ability to perform ADLs. Typically uses a cane to walk around, and family stated that she has had more difficulty with mobility. Pt reports having cough and leg swelling for approximately two weeks, somewhat resolved. Family reported to EMS that pt may have been non-compliant with medications recently.   #20 R AC  EMS last VS - 140/63, HR 95, RR 22, 98% on RA, CBG 121. Pt had neg orthostatic changes with EMS.

## 2020-08-15 NOTE — ED Notes (Signed)
Paged critical care.  

## 2020-08-15 NOTE — Progress Notes (Signed)
Brief note regarding preliminary plan, with full H&P to follow:  87 year old female with chronic diastolic heart failure, who is admitted with acute encephalopathy as well as acute hypoxic respiratory distress after presenting with 4 to 5 days of generalized weakness, mild confusion, lethargy/somnolence, with oxygen saturations noted to be 86 to 87% on room air, with subsequent improvement into the mid to high 90s on 2 L nasal cannula.  This is in the absence of any known underlying baseline supplemental oxygen requirements.  She reportedly has a mildly elevated BNP, but with CXR that demonstrates no overt acute cardiopulmonary process.  COVID-19 screen currently pending.  Denies any acute respiratory symptoms.  Initial high-sensitivity troponin I mildly elevated at 18, with follow-up troponin value currently pending.  EKG shows rate controlled persistent atrial fibrillation without evidence of acute ischemic changes, the patient denies any recent chest discomfort.  We will further trend serial troponin.  Monitor on telemetry and monitor continuous pulse oximetry and follow for results of COVID-19 PCR.  Check urinalysis, TSH.     Babs Bertin, DO Hospitalist

## 2020-08-15 NOTE — ED Provider Notes (Signed)
  Face-to-face evaluation   History: She is here for evaluation of lethargy, sleepiness, and less active than usual.  She had a fall about 3 weeks ago and possibly injured her head.  At that time she had been visiting family members in [place].  She has not yet evaluated for that.  She has apparently been eating well.  She is here with her son who gives history.  He lives adjacent to her, but she has her own home with her husband.  I have been no reported episodes such as fever, vomiting, or dizziness.  She has a remote history of tobacco abuse, but does not have chronic lung disease.  Has a history of diastolic congestive heart failure  Physical exam:, Alert and cooperative.  She is mildly confused.  No respiratory distress.  As I entered the room at 3:30 PM, her oxygenation was 97% while on nasal cannula oxygen at 2 L.  I removed the oxygen she gradually decreased to 90% on room air.  Chest x-ray ordered.  blood gas ordered to assess her respiratory status.  MDM-patient with nonspecific complaints, who had transient hypoxia, likely related to poor sampling.  Arterial blood gas obtained however it appears to be mixed venous.  At 4:30 PM, on room air, oxygenation reading 100% with oxygen sat monitor.  There is no indication for acute respiratory distress, or need for outpatient treatment with oxygen at this time.  Patient will continue to be evaluated for causes of malaise, sleepiness and weakness  Medical screening examination/treatment/procedure(s) were conducted as a shared visit with non-physician practitioner(s) and myself.  I personally evaluated the patient during the encounter    Daleen Bo, MD 08/17/20 1358

## 2020-08-16 ENCOUNTER — Encounter (HOSPITAL_COMMUNITY): Payer: Self-pay | Admitting: Internal Medicine

## 2020-08-16 ENCOUNTER — Inpatient Hospital Stay (HOSPITAL_COMMUNITY): Payer: Medicare Other

## 2020-08-16 DIAGNOSIS — J9601 Acute respiratory failure with hypoxia: Secondary | ICD-10-CM | POA: Diagnosis present

## 2020-08-16 DIAGNOSIS — R778 Other specified abnormalities of plasma proteins: Secondary | ICD-10-CM

## 2020-08-16 DIAGNOSIS — G9340 Encephalopathy, unspecified: Secondary | ICD-10-CM

## 2020-08-16 DIAGNOSIS — I5032 Chronic diastolic (congestive) heart failure: Secondary | ICD-10-CM

## 2020-08-16 DIAGNOSIS — R531 Weakness: Secondary | ICD-10-CM

## 2020-08-16 LAB — CBC
HCT: 46.5 % — ABNORMAL HIGH (ref 36.0–46.0)
Hemoglobin: 15.8 g/dL — ABNORMAL HIGH (ref 12.0–15.0)
MCH: 32.2 pg (ref 26.0–34.0)
MCHC: 34 g/dL (ref 30.0–36.0)
MCV: 94.7 fL (ref 80.0–100.0)
Platelets: 261 10*3/uL (ref 150–400)
RBC: 4.91 MIL/uL (ref 3.87–5.11)
RDW: 13 % (ref 11.5–15.5)
WBC: 5.6 10*3/uL (ref 4.0–10.5)
nRBC: 0 % (ref 0.0–0.2)

## 2020-08-16 LAB — COMPREHENSIVE METABOLIC PANEL
ALT: 15 U/L (ref 0–44)
AST: 21 U/L (ref 15–41)
Albumin: 3.3 g/dL — ABNORMAL LOW (ref 3.5–5.0)
Alkaline Phosphatase: 92 U/L (ref 38–126)
Anion gap: 11 (ref 5–15)
BUN: 13 mg/dL (ref 8–23)
CO2: 28 mmol/L (ref 22–32)
Calcium: 8.6 mg/dL — ABNORMAL LOW (ref 8.9–10.3)
Chloride: 95 mmol/L — ABNORMAL LOW (ref 98–111)
Creatinine, Ser: 0.9 mg/dL (ref 0.44–1.00)
GFR, Estimated: 59 mL/min — ABNORMAL LOW (ref >60)
Glucose, Bld: 132 mg/dL — ABNORMAL HIGH (ref 70–99)
Potassium: 3.4 mmol/L — ABNORMAL LOW (ref 3.5–5.1)
Sodium: 134 mmol/L — ABNORMAL LOW (ref 135–145)
Total Bilirubin: 1 mg/dL (ref 0.3–1.2)
Total Protein: 6.6 g/dL (ref 6.5–8.1)

## 2020-08-16 LAB — ECHOCARDIOGRAM COMPLETE
AR max vel: 1.78 cm2
AV Area VTI: 1.66 cm2
AV Area mean vel: 1.58 cm2
AV Mean grad: 8 mmHg
AV Peak grad: 11.6 mmHg
Ao pk vel: 1.7 m/s
Area-P 1/2: 3.27 cm2
Calc EF: 71.2 %
Height: 63 in
MV M vel: 4.52 m/s
MV Peak grad: 81.7 mmHg
S' Lateral: 2.8 cm
Single Plane A2C EF: 56.3 %
Single Plane A4C EF: 82 %
Weight: 2544 oz

## 2020-08-16 LAB — PROTIME-INR
INR: 1.7 — ABNORMAL HIGH (ref 0.8–1.2)
Prothrombin Time: 20.2 seconds — ABNORMAL HIGH (ref 11.4–15.2)

## 2020-08-16 LAB — TSH: TSH: 2.542 u[IU]/mL (ref 0.350–4.500)

## 2020-08-16 LAB — PROCALCITONIN: Procalcitonin: <0.1 ng/mL

## 2020-08-16 LAB — BLOOD GAS, VENOUS
Acid-Base Excess: 5.3 mmol/L — ABNORMAL HIGH (ref 0.0–2.0)
Bicarbonate: 29 mmol/L — ABNORMAL HIGH (ref 20.0–28.0)
Drawn by: 5790
FIO2: 28
O2 Saturation: 84.1 %
Patient temperature: 36.6
pCO2, Ven: 39.5 mmHg — ABNORMAL LOW (ref 44.0–60.0)
pH, Ven: 7.477 — ABNORMAL HIGH (ref 7.250–7.430)
pO2, Ven: 45.7 mmHg — ABNORMAL HIGH (ref 32.0–45.0)

## 2020-08-16 LAB — CK: Total CK: 55 U/L (ref 38–234)

## 2020-08-16 LAB — PHOSPHORUS: Phosphorus: 3.6 mg/dL (ref 2.5–4.6)

## 2020-08-16 LAB — TROPONIN I (HIGH SENSITIVITY): Troponin I (High Sensitivity): 23 ng/L — ABNORMAL HIGH (ref <18)

## 2020-08-16 LAB — MAGNESIUM: Magnesium: 1.9 mg/dL (ref 1.7–2.4)

## 2020-08-16 MED ORDER — ENSURE ENLIVE PO LIQD
237.0000 mL | Freq: Two times a day (BID) | ORAL | Status: DC
  Administered 2020-08-16: 237 mL via ORAL

## 2020-08-16 MED ORDER — ADULT MULTIVITAMIN W/MINERALS CH
1.0000 | ORAL_TABLET | Freq: Every day | ORAL | Status: DC
  Administered 2020-08-16: 1 via ORAL
  Filled 2020-08-16: qty 1

## 2020-08-16 NOTE — Care Management Important Message (Signed)
Important Message  Patient Details  Name: Maria [name] MRN: [id number] Date of Birth: Jan 22, 1923   Medicare Important Message Given:  Yes     Ninfa Meeker, RN 08/16/2020, 3:12 PM

## 2020-08-16 NOTE — Evaluation (Signed)
Physical Therapy Evaluation Patient Details Name: Maria [name] MRN: [id number] DOB: Apr 11, 1922 Today's Date: 08/16/2020   History of Present Illness  87yo female admitted 08/15/20 with family reports of lethargy, somnolence, weakness, AMS. Covid and flu negative. CTH negative. Admitted with acute encephalopathy. PMH CHF, HLD, HTN, MI, A-fib, peripheral neuropathy, CVA, shingles, TIA, B THA  Clinical Impression   Patient received in bed, sleeping but easily woken; pleasant and cooperative, but HOH. Able to mobilize on a min guard basis with RW and intermittent cues for hand placement and safety. VSS on RA. Did have slight difficulty navigating around obstacles in the hallway but able to correct without extra assist from PT. Left up in recliner with all needs met, chair alarm active, family present. Will benefit from skilled HHPT f/u at [place].     Follow Up Recommendations Home health PT;Supervision for mobility/OOB    Equipment Recommendations  None recommended by PT    Recommendations for Other Services       Precautions / Restrictions Precautions Precautions: Fall Restrictions Weight Bearing Restrictions: No      Mobility  Bed Mobility Overal bed mobility: Needs Assistance Bed Mobility: Supine to Sit     Supine to sit: Min guard;HOB elevated     General bed mobility comments: increased time and effort, no physical assist given    Transfers Overall transfer level: Needs assistance Equipment used: Rolling walker (2 wheeled) Transfers: Sit to/from Stand Sit to Stand: Min guard         General transfer comment: min guard for safety and line management, cues for hand placement  Ambulation/Gait Ambulation/Gait assistance: Min guard Gait Distance (Feet): 100 Feet Assistive device: Rolling walker (2 wheeled) Gait Pattern/deviations: Trunk flexed;Step-through pattern;Drifts right/left Gait velocity: decreased   General Gait Details: slow and steady with RW, did have  occasional difficulty navigating around obstacles but able to correct without additional assist from PT. VSS on RA  Stairs            Wheelchair Mobility    Modified Rankin (Stroke Patients Only)       Balance Overall balance assessment: History of Falls;Needs assistance Sitting-balance support: Bilateral upper extremity supported;Feet supported Sitting balance-Leahy Scale: Good     Standing balance support: Bilateral upper extremity supported;During functional activity Standing balance-Leahy Scale: Fair Standing balance comment: reliant on BUE support                             Pertinent Vitals/Pain Pain Assessment: No/denies pain    Home Living Family/patient expects to be discharged to:: Private residence Living Arrangements: Spouse/significant other Available Help at Discharge: Family;Available 24 hours/day (daughter lives in same house, son lives next door) Type of Home: House Home Access: Stairs to enter Entrance Stairs-Rails: Right;Left;Can reach both Technical brewer of Steps: 3 Home Layout: One level [place]: [place] - single point;Grab bars - tub/shower;Walker - 2 wheels Additional Comments: had a fall in [place] 3 weeks ago- slipped on the floor; son reports "she looks like she's a walking fall as she moves"    Prior Function Level of Independence: Independent with assistive device(s)         Comments: still doing cooking/cleaning and ADLs, does not drive anymore. Had stopped going to [facility], stopped when the pandemic started. Goes out to eat often.     Hand Dominance        Extremity/Trunk Assessment   Upper Extremity Assessment Upper Extremity Assessment: Defer to  OT evaluation    Lower Extremity Assessment Lower Extremity Assessment: Generalized weakness    Cervical / Trunk Assessment Cervical / Trunk Assessment: Kyphotic  Communication      Cognition Arousal/Alertness: Awake/alert Behavior During Therapy: WFL  for tasks assessed/performed;Flat affect Overall Cognitive Status: Within Functional Limits for tasks assessed                                 General Comments: HOH, sleepy and fatigued but pleasant and followed cues well once awake. 0/3 on word recall.      General Comments      Exercises     Assessment/Plan    PT Assessment Patient needs continued PT services  PT Problem List Decreased strength;Decreased cognition;Decreased activity tolerance;Decreased knowledge of use of DME;Decreased safety awareness;Decreased balance;Decreased mobility;Decreased coordination       PT Treatment Interventions DME instruction;Balance training;Gait training;Stair training;Cognitive remediation;Functional mobility training;Patient/family education;Therapeutic activities;Therapeutic exercise    PT Goals (Current goals can be found in the Care Plan section)  Acute Rehab PT Goals Patient Stated Goal: go home once medically ready PT Goal Formulation: With patient/family Time For Goal Achievement: 08/30/20 Potential to Achieve Goals: Good    Frequency Min 3X/week   Barriers to discharge        Co-evaluation               AM-PAC PT "6 Clicks" Mobility  Outcome Measure Help needed turning from your back to your side while in a flat bed without using bedrails?: A Little Help needed moving from lying on your back to sitting on the side of a flat bed without using bedrails?: A Little Help needed moving to and from a bed to a chair (including a wheelchair)?: A Little Help needed standing up from a chair using your arms (e.g., wheelchair or bedside chair)?: A Little Help needed to walk in hospital room?: A Little Help needed climbing 3-5 steps with a railing? : A Little 6 Click Score: 18    End of Session Equipment Utilized During Treatment: Gait belt Activity Tolerance: Patient tolerated treatment well Patient left: in chair;with call bell/phone within reach;with chair alarm  set Nurse Communication: Mobility status PT Visit Diagnosis: Muscle weakness (generalized) (M62.81);Unsteadiness on feet (R26.81);History of falling (Z91.81)    Time: 4461-9012 PT Time Calculation (min) (ACUTE ONLY): 31 min   Charges:   PT Evaluation $PT Eval Moderate Complexity: 1 Mod PT Treatments $Gait Training: 8-22 mins       Windell Norfolk, DPT, PN1   Supplemental Physical Therapist [place]    Pager [phone] Acute Rehab Office [phone]

## 2020-08-16 NOTE — TOC Initial Note (Signed)
Transition of Care [facility]) - Initial/Assessment Note    Patient Details  Name: Maria [name] MRN: [id number] Date of Birth: [birth year]  Transition of [facility]) CM/SW Contact:    Ninfa Meeker, RN Phone Number: 08/16/2020, 2:16 PM  Clinical Narrative:  Case manager spoke with patient's husband concerning discharge needs. Patient is not medically ready for Discharge today. Discussed choice for Home Health agencies. Referral called to Adela Lank, Liaison with [facility] TOC Team will continue to follow.               Expected Discharge Plan: [place] Barriers to Discharge: Continued Medical Work up   Patient Goals and CMS Choice   CMS Medicare.gov Compare Post Acute Care list provided to:: Patient Represenative (must comment) Choice offered to / list presented to : Spouse  Expected Discharge Plan and Services Expected Discharge Plan: [place] In-house Referral: NA Discharge Planning Services: CM Consult Post Acute Care Choice: [place] arrangements for the past 2 months: Single Family Home                 DME Arranged: N/A DME Agency: NA       HH Arranged: PT HH Agency: [facility] Date [facility] Agency Contacted: 08/16/20 Time HH Agency Contacted: 1414 Representative spoke with at [place]: Adela Lank  Prior Living Arrangements/Services Living arrangements for the past 2 months: [place] Lives with:: Spouse Patient language and need for interpreter reviewed:: Yes Do you feel safe going back to the place where you live?: Yes      Need for Family Participation in Patient Care: Yes (Comment) Care giver support system in place?: Yes (comment)   Criminal Activity/Legal Involvement Pertinent to Current Situation/Hospitalization: No - Comment as needed  Activities of Daily Living Home Assistive Devices/Equipment: Cane (specify quad or straight) ADL Screening (condition at time of  admission) Patient's cognitive ability adequate to safely complete daily activities?: Yes Is the patient deaf or have difficulty hearing?: Yes Does the patient have difficulty seeing, even when wearing glasses/contacts?: Yes Does the patient have difficulty concentrating, remembering, or making decisions?: No Patient able to express need for assistance with ADLs?: Yes Does the patient have difficulty dressing or bathing?: Yes Independently performs ADLs?: Yes (appropriate for developmental age) Does the patient have difficulty walking or climbing stairs?: No Weakness of Legs: Both Weakness of Arms/Hands: None  Permission Sought/Granted                  Emotional Assessment         Alcohol / Substance Use: Not Applicable Psych Involvement: No (comment)  Admission diagnosis:  Weakness [R53.1] Hypoxia [R09.02] Acute encephalopathy [G93.40] Patient Active Problem List   Diagnosis Date Noted   Acute respiratory failure with hypoxia ([place]) 08/16/2020   Generalized weakness 08/16/2020   Elevated troponin 08/16/2020   Chronic diastolic CHF (congestive heart failure) ([place]) 08/16/2020   Acute encephalopathy 08/15/2020   Paroxysmal atrial fibrillation ([place]) 01/29/2017   ARF (acute renal failure) ([place]) 11/25/2016   Hypertensive urgency    TIA (transient ischemic attack) 03/12/2015   Dependent edema 03/12/2015   Dysphagia    Hyperlipidemia 11/16/2014   Essential hypertension 11/16/2014   Postop Acute blood loss anemia 06/01/2011   Osteoarthritis of hip 05/27/2011   PCP:  Jani Gravel, MD Pharmacy:   [place] ([place]), [place] [address] [place] (SE) [place]  [address] Phone: [phone] Fax: [phone]     Social Determinants of Health (SDOH) Interventions    Readmission Risk Interventions No flowsheet data found.

## 2020-08-16 NOTE — Progress Notes (Signed)
*  PRELIMINARY RESULTS* Echocardiogram 2D Echocardiogram has been performed.  Luisa Hart RDCS 08/16/2020, 10:57 AM

## 2020-08-16 NOTE — Discharge Summary (Signed)
Physician Discharge Summary  Maria [name] ATF:[id number] DOB: 09/08/1922 DOA: 08/15/2020  PCP: Jani Gravel, MD  Admit date: 08/15/2020 Discharge date: 08/16/2020  Admitted From: Home  Disposition:  Home   Recommendations for Outpatient Follow-up:  Follow up with PCP Dr. Maudie Mercury in 1-2 weeks Dr. Maudie Mercury: Please follow up echo results and methylmalonic acid results       Home Health: PT/OT due to balance issues  Equipment/Devices: None  Discharge Condition: Good  CODE STATUS: FULL Diet recommendation: Regular  Brief/Interim Summary: Maria [name] is a 87 y.o. F with HTN, cAF on Eliquis, hx stroke, dCHF, B12 deficiency who presents with 3 weeks decreased energy, intermittent confusion, and weakness.   No dyspnea on exertion, orthpnea, swelling.  No cough, fever.  No urinary irritative symptoms.  No focal weakness, speech deficits.   In the ER, afebrile, VS normal.  CXR clear.  CT head normal.  EKG unremarkable.  Admitted for observation for confusion.             PRINCIPAL HOSPITAL DIAGNOSIS: Confused spell    Discharge Diagnoses:   Brief confusion, acute metabolic encephalopathy, ruled out  At baseline the patient is independent with all IADLs, very active, alert.  Family have noticed she has signs of losing some executive functions (check book for example) recently, and were thinking of having her evaluated for dementia.  The night of admission, however, she had an episode of shaking in both hands, and was slow to respond. (This was not seizure-like, it was more a tremor, and she did not lose consciousness).  It seems by report this was after waking up. EMS was activated due to this.     Hypoxia This was the concern from the ER prompting observation overnight.    Patient's CXR was clear. She had no elevated JVP and no leg swelling.   BNP somewhat elevated but no prior for reference, this is likely baseline.  She had no exertional dyspnea, and on closer examination, it  was clear that she was hypoxic only when sleeping (and son described to me apneic spells with it).  She was continued on her home Lasix. The day of discharge, she was satting 95-96% on room air, normal, without intervention.  Stable for discharge.   Generalized weakness Unclear cause.  Thorough work up with electrolytes, CT head for bleeding intracranially, heart attack/EKG, chest x-ray, blood counts, TSH, CK, procalcitonin for infection, blood gases were all reassuring.  UA ruled out UTI. This may be related to the apneic episodes (ie sleep apnea).  It may also be normal for a 87 year old.      Chronic diastolic CHF No signs of fluid overload on exam or CXR.  No symptoms of fluid overload here.  Persistent atrial fibrillation No change from baseline.  HR normal.  Hypertension   Anemia  Stable, mild.  Elevated troponin This minimal elevation is not clinically significant, and she was without chest pain or EKG changes.         Discharge Instructions  Discharge Instructions     Discharge instructions   Complete by: As directed    From Dr. Loleta Books: You were evaluated for fatigue over the last 2 weeks, and a spell of confusion  Here, we found that your testing was all reassuring.  Your blood counts and kidney function were normal.  You had no signs of infection.  You had no symptoms of congestive heart and your chest x-ray ruled out fluid in the lungs (pulmonary edema).  Imaging of your brain was normal.  Your EKG was unremarkable.  Dr. Velia Meyer searched for a wide variety of other conditions (vitamin deficiencies, thyroid problems, liver disease, etc) that can cause fatigue and confusion, and these were all normal and reassuring.  Given that all your testing was normal, and that your thinking and physical function improved, I suspect this was nothing serious, maybe your body still slowly recovering from that recent bronchitis.    Go see your primary care doctor in 1 week    Increase activity slowly   Complete by: As directed       Allergies as of 08/16/2020       Reactions   Codeine Nausea And Vomiting   Tylenol [acetaminophen] Itching   Ibuprofen Itching, Rash   Latex Rash, Other (See Comments)   Usually breaks out the skin   Tape Rash, Other (See Comments)   Usually breaks out the skin        Medication List     TAKE these medications    ezetimibe 10 MG tablet Commonly known as: ZETIA Take 10 mg by mouth daily with lunch.   ferrous sulfate 325 (65 FE) MG tablet Take 1 tablet (325 mg total) by mouth 2 (two) times daily after a meal.   hydrocortisone cream 1 % Apply 1 application topically 2 (two) times daily as needed (for itching- apply to affected areas).   Pfizer-BioNTech COVID-19 Vacc 30 MCG/0.3ML injection Generic drug: COVID-19 mRNA vaccine (Pfizer) USE AS DIRECTED   PRESERVISION AREDS PO Take 1 tablet by mouth 2 (two) times daily.   VITAMIN B-12 PO Take 1 tablet by mouth daily with breakfast.       ASK your doctor about these medications    amLODipine 5 MG tablet Commonly known as: NORVASC Take 1 tablet by mouth once daily   Eliquis 5 MG Tabs tablet Generic drug: apixaban Take 1 tablet by mouth twice daily   furosemide 80 MG tablet Commonly known as: LASIX Take 1 tablet by mouth once daily        Follow-up Information     Care, [facility] Follow up.   Specialty: Home Health Services Why: Someone from [facility] will contact you to arrange start date and time for your therapy. Contact information: [place] [address] [phone]         Jani Gravel, MD. Schedule an appointment as soon as possible for a visit in 1 week(s).   Specialty: Internal Medicine Contact information: [address] [place] [address] [phone]                Allergies  Allergen Reactions   Codeine Nausea And Vomiting   Tylenol [Acetaminophen] Itching    Ibuprofen Itching and Rash   Latex Rash and Other (See Comments)    Usually breaks out the skin   Tape Rash and Other (See Comments)    Usually breaks out the skin      Procedures/Studies: CT Head Wo Contrast  Result Date: 08/15/2020 CLINICAL DATA:  Mental status changes of unknown cause, history CHF, hypertension, MI, atrial fibrillation, stroke, former smoker EXAM: CT HEAD WITHOUT CONTRAST TECHNIQUE: Contiguous axial images were obtained from the base of the skull through the vertex without intravenous contrast. Sagittal and coronal MPR images reconstructed from axial data set. COMPARISON:  11/25/2016 FINDINGS: Brain: Generalized atrophy. Normal ventricular morphology. No midline shift or mass effect. Small vessel chronic ischemic changes of deep cerebral  white matter. Old cortical infarct LEFT frontal lobe. Small old LEFT cerebellar infarct. No intracranial hemorrhage, mass lesion or evidence of acute infarction. No extra-axial fluid collections. Vascular: No hyperdense vessels Skull: Intact Sinuses/Orbits: Clear Other: N/A IMPRESSION: Atrophy with small vessel chronic ischemic changes of deep cerebral white matter. 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 No acute intracranial abnormalities. Electronically Signed   By: Lavonia Dana M.D.   On: 08/15/2020 14:26   DG Chest Portable 1 View  Result Date: 08/15/2020 CLINICAL DATA:  Dyspnea, history of atrial fibrillation EXAM: PORTABLE CHEST 1 VIEW COMPARISON:  08/06/2020 chest radiograph. FINDINGS: Stable cardiomediastinal silhouette with mild cardiomegaly. No pneumothorax. No pleural effusion. No pulmonary edema. No acute consolidative airspace disease. Minimal linear scarring at the right costophrenic angle. IMPRESSION: Mild cardiomegaly without pulmonary edema. No active pulmonary disease. Electronically Signed   By: Ilona Sorrel M.D.   On: 08/15/2020 16:12   ECHOCARDIOGRAM COMPLETE  Result Date: 08/16/2020    ECHOCARDIOGRAM REPORT    Patient Name:   MILILANI MURTHY Date of Exam: 08/16/2020 Medical Rec #:  [id number]            Height:       63.0 in Accession #:    [id number]           Weight:       159.0 lb Date of Birth:  08-10-1922            BSA:          1.754 m Patient Age:    87 years             BP:           107/55 mmHg Patient Gender: F                    HR:           87 bpm. Exam Location:  Inpatient Procedure: 2D Echo, Cardiac Doppler and Color Doppler Indications:    Elevated troponin  History:        Patient has prior history of Echocardiogram examinations, most                 recent 11/08/2016. CHF, TIA, Arrythmias:Atrial Fibrillation; Risk                 Factors:Dyslipidemia and Hypertension.  Sonographer:    [place] Referring Phys: [id number] [place]  1. Left ventricular ejection fraction, by estimation, is 60 to 65%. The left ventricle has normal function. The left ventricle has no regional wall motion abnormalities. There is severe left ventricular hypertrophy. Left ventricular diastolic parameters  are indeterminate.  2. Right ventricular systolic function is normal. The right ventricular size is mildly enlarged. There is mildly elevated pulmonary artery systolic pressure. The estimated right ventricular systolic pressure is 32.9 mmHg.  3. Left atrial size was severely dilated.  4. Right atrial size was moderately dilated.  5. The mitral valve is degenerative. Trivial mitral valve regurgitation. No evidence of mitral stenosis. Severe mitral annular calcification.  6. The aortic valve is tricuspid. There is moderate calcification of the aortic valve. Aortic valve regurgitation is not visualized. Mild to moderate aortic valve sclerosis/calcification is present, without any evidence of aortic stenosis. FINDINGS  Left Ventricle: Left ventricular ejection fraction, by estimation, is 60 to 65%. The left ventricle has normal function. The left ventricle has no regional wall motion abnormalities. The  left ventricular internal cavity size was  small. There is severe left ventricular hypertrophy. Left ventricular diastolic parameters are indeterminate. Right Ventricle: The right ventricular size is mildly enlarged. No increase in right ventricular wall thickness. Right ventricular systolic function is normal. There is mildly elevated pulmonary artery systolic pressure. The tricuspid regurgitant velocity is 2.93 m/s, and with an assumed right atrial pressure of 3 mmHg, the estimated right ventricular systolic pressure is 73.2 mmHg. Left Atrium: Left atrial size was severely dilated. Right Atrium: Right atrial size was moderately dilated. Pericardium: There is no evidence of pericardial effusion. Mitral Valve: The mitral valve is degenerative in appearance. Severe mitral annular calcification. Trivial mitral valve regurgitation. No evidence of mitral valve stenosis. Tricuspid Valve: The tricuspid valve is normal in structure. Tricuspid valve regurgitation is mild. Aortic Valve: The aortic valve is tricuspid. There is moderate calcification of the aortic valve. Aortic valve regurgitation is not visualized. Mild to moderate aortic valve sclerosis/calcification is present, without any evidence of aortic stenosis. Aortic valve mean gradient measures 8.0 mmHg. Aortic valve peak gradient measures 11.6 mmHg. Aortic valve area, by VTI measures 1.66 cm. Pulmonic Valve: The pulmonic valve was grossly normal. Pulmonic valve regurgitation is mild. Aorta: The aortic root is normal in size and structure. IAS/Shunts: No atrial level shunt detected by color flow Doppler.  LEFT VENTRICLE PLAX 2D LVIDd:         3.40 cm LVIDs:         2.80 cm LV PW:         1.70 cm LV IVS:        1.40 cm LVOT diam:     2.20 cm LV SV:         49 LV SV Index:   28 LVOT Area:     3.80 cm  LV Volumes (MOD) LV vol d, MOD A2C: 39.8 ml LV vol d, MOD A4C: 37.6 ml LV vol s, MOD A2C: 17.4 ml LV vol s, MOD A4C: 6.8 ml LV SV MOD A2C:     22.4 ml LV SV MOD A4C:      37.6 ml LV SV MOD BP:      27.9 ml RIGHT VENTRICLE RV Basal diam:  3.40 cm RV Mid diam:    1.80 cm RV S prime:     15.70 cm/s TAPSE (M-mode): 1.0 cm LEFT ATRIUM              Index       RIGHT ATRIUM           Index LA diam:        5.20 cm  2.96 cm/m  RA Area:     23.90 cm LA Vol (A2C):   119.0 ml 67.84 ml/m RA Volume:   70.70 ml  40.31 ml/m LA Vol (A4C):   113.0 ml 64.42 ml/m LA Biplane Vol: 116.0 ml 66.13 ml/m  AORTIC VALVE                    PULMONIC VALVE AV Area (Vmax):    1.78 cm     PV Vmax:       1.13 m/s AV Area (Vmean):   1.58 cm     PV Vmean:      78.450 cm/s AV Area (VTI):     1.66 cm     PV VTI:        0.176 m AV Vmax:           170.00 cm/s  PV Peak grad:  5.1 mmHg AV  Vmean:          135.000 cm/s PV Mean grad:  3.0 mmHg AV VTI:            0.296 m AV Peak Grad:      11.6 mmHg AV Mean Grad:      8.0 mmHg LVOT Vmax:         79.50 cm/s LVOT Vmean:        56.000 cm/s LVOT VTI:          0.129 m LVOT/AV VTI ratio: 0.44  AORTA Ao Root diam: 2.60 cm MITRAL VALVE                TRICUSPID VALVE MV Area (PHT): 3.27 cm     TR Peak grad:   34.3 mmHg MV Decel Time: 232 msec     TR Vmax:        293.00 cm/s MR Peak grad: 81.7 mmHg MR Vmax:      452.00 cm/s   SHUNTS MV E velocity: 116.00 cm/s  Systemic VTI:  0.13 m                             Systemic Diam: 2.20 cm Oswaldo Milian MD Electronically signed by Oswaldo Milian MD Signature Date/Time: 08/16/2020/2:53:30 PM    Final       Subjective: Feeling well, at baseline.  No cough, no dyspnea.  No swelling.  No chest pain.  Feels her normal self.  Discharge Exam: Vitals:   08/16/20 0837 08/16/20 1253  BP: (!) 142/91 113/70  Pulse: 88 83  Resp: 20 18  Temp: 99 F (37.2 C) 98 F (36.7 C)  SpO2: 92% 92%   Vitals:   08/16/20 0221 08/16/20 0400 08/16/20 0837 08/16/20 1253  BP: (!) 141/67 (!) 107/55 (!) 142/91 113/70  Pulse: 94 76 88 83  Resp: 20 20 20 18   Temp: 98.9 F (37.2 C) 98.8 F (37.1 C) 99 F (37.2 C) 98 F (36.7 C)   TempSrc: Oral Oral Oral Oral  SpO2:  93% 92% 92%  Weight:      Height:        General: Pt is alert, awake, not in acute distress Cardiovascular: RRR, nl S1-S2, no murmurs appreciated.   No LE edema.   Respiratory: Normal respiratory rate and rhythm.  CTAB without rales or wheezes. Abdominal: Abdomen soft and non-tender.  No distension or HSM.   Neuro/Psych: Strength symmetric in upper and lower extremities.  Judgment and insight appear normal.   The results of significant diagnostics from this hospitalization (including imaging, microbiology, ancillary and laboratory) are listed below for reference.     Microbiology: Recent Results (from the past 240 hour(s))  Resp Panel by RT-PCR (Flu A&B, Covid) Nasopharyngeal Swab     Status: None   Collection Time: 08/15/20  5:29 PM   Specimen: Nasopharyngeal Swab; Nasopharyngeal(NP) swabs in vial transport medium  Result Value Ref Range Status   SARS Coronavirus 2 by RT PCR NEGATIVE NEGATIVE Final    Comment: (NOTE) SARS-CoV-2 target nucleic acids are NOT DETECTED.  The SARS-CoV-2 RNA is generally detectable in upper respiratory specimens during the acute phase of infection. The lowest concentration of SARS-CoV-2 viral copies this assay can detect is 138 copies/mL. A negative result does not preclude SARS-Cov-2 infection and should not be used as the sole basis for treatment or other patient management decisions. A negative result may occur with  improper specimen collection/handling,  submission of specimen other than nasopharyngeal swab, presence of viral mutation(s) within the areas targeted by this assay, and inadequate number of viral copies(<138 copies/mL). A negative result must be combined with clinical observations, patient history, and epidemiological information. The expected result is Negative.  Fact Sheet for Patients:  EntrepreneurPulse.com.au  Fact Sheet for Healthcare Providers:   IncredibleEmployment.be  This test is no t yet approved or cleared by the [place] FDA and  has been authorized for detection and/or diagnosis of SARS-CoV-2 by FDA under an Emergency Use Authorization (EUA). This EUA will remain  in effect (meaning this test can be used) for the duration of the COVID-19 declaration under Section 564(b)(1) of the Act, 21 U.S.C.section 360bbb-3(b)(1), unless the authorization is terminated  or revoked sooner.       Influenza A by PCR NEGATIVE NEGATIVE Final   Influenza B by PCR NEGATIVE NEGATIVE Final    Comment: (NOTE) The Xpert Xpress SARS-CoV-2/FLU/RSV plus assay is intended as an aid in the diagnosis of influenza from Nasopharyngeal swab specimens and should not be used as a sole basis for treatment. Nasal washings and aspirates are unacceptable for Xpert Xpress SARS-CoV-2/FLU/RSV testing.  Fact Sheet for Patients: EntrepreneurPulse.com.au  Fact Sheet for Healthcare Providers: IncredibleEmployment.be  This test is not yet approved or cleared by the [place] FDA and has been authorized for detection and/or diagnosis of SARS-CoV-2 by FDA under an Emergency Use Authorization (EUA). This EUA will remain in effect (meaning this test can be used) for the duration of the COVID-19 declaration under Section 564(b)(1) of the Act, 21 U.S.C. section 360bbb-3(b)(1), unless the authorization is terminated or revoked.  Performed at [facility], [place] [address], [place], [place] [address]      Labs: BNP (last 3 results) Recent Labs    08/15/20 1732  BNP 664.4*   Basic Metabolic Panel: Recent Labs  Lab 08/15/20 1224 08/15/20 1625 08/15/20 1906 08/16/20 0111  NA 138 135  --  134*  K 3.6 3.2*  --  3.4*  CL 95*  --   --  95*  CO2 31  --   --  28  GLUCOSE 122*  --   --  132*  BUN 12  --   --  13  CREATININE 0.93  --   --  0.90  CALCIUM 8.6*  --   --  8.6*  MG  --   --   2.0 1.9  PHOS  --   --   --  3.6   Liver Function Tests: Recent Labs  Lab 08/16/20 0111  AST 21  ALT 15  ALKPHOS 92  BILITOT 1.0  PROT 6.6  ALBUMIN 3.3*   No results for input(s): LIPASE, AMYLASE in the last 168 hours. No results for input(s): AMMONIA in the last 168 hours. CBC: Recent Labs  Lab 08/15/20 1224 08/15/20 1625 08/16/20 0111  WBC 5.8  --  5.6  HGB 14.3 14.6 15.8*  HCT 44.5 43.0 46.5*  MCV 99.1  --  94.7  PLT 268  --  261   Cardiac Enzymes: Recent Labs  Lab 08/16/20 0111  CKTOTAL 55   BNP: Invalid input(s): POCBNP CBG: Recent Labs  Lab 08/15/20 1237  GLUCAP 121*   D-Dimer No results for input(s): DDIMER in the last 72 hours. Hgb A1c No results for input(s): HGBA1C in the last 72 hours. Lipid Profile No results for input(s): CHOL, HDL, LDLCALC, TRIG, CHOLHDL, LDLDIRECT in the last 72 hours. Thyroid function studies  Recent Labs    08/16/20 0111  TSH 2.542   Anemia work up No results for input(s): VITAMINB12, FOLATE, FERRITIN, TIBC, IRON, RETICCTPCT in the last 72 hours. Urinalysis    Component Value Date/Time   COLORURINE YELLOW 08/15/2020 1524   APPEARANCEUR CLEAR 08/15/2020 1524   LABSPEC 1.009 08/15/2020 1524   PHURINE 7.0 08/15/2020 1524   GLUCOSEU NEGATIVE 08/15/2020 1524   HGBUR SMALL (A) 08/15/2020 1524   BILIRUBINUR NEGATIVE 08/15/2020 1524   KETONESUR NEGATIVE 08/15/2020 1524   PROTEINUR NEGATIVE 08/15/2020 1524   UROBILINOGEN 0.2 05/14/2011 1554   NITRITE NEGATIVE 08/15/2020 1524   LEUKOCYTESUR NEGATIVE 08/15/2020 1524   Sepsis Labs Invalid input(s): PROCALCITONIN,  WBC,  LACTICIDVEN Microbiology Recent Results (from the past 240 hour(s))  Resp Panel by RT-PCR (Flu A&B, Covid) Nasopharyngeal Swab     Status: None   Collection Time: 08/15/20  5:29 PM   Specimen: Nasopharyngeal Swab; Nasopharyngeal(NP) swabs in vial transport medium  Result Value Ref Range Status   SARS Coronavirus 2 by RT PCR NEGATIVE NEGATIVE Final     Comment: (NOTE) SARS-CoV-2 target nucleic acids are NOT DETECTED.  The SARS-CoV-2 RNA is generally detectable in upper respiratory specimens during the acute phase of infection. The lowest concentration of SARS-CoV-2 viral copies this assay can detect is 138 copies/mL. A negative result does not preclude SARS-Cov-2 infection and should not be used as the sole basis for treatment or other patient management decisions. A negative result may occur with  improper specimen collection/handling, submission of specimen other than nasopharyngeal swab, presence of viral mutation(s) within the areas targeted by this assay, and inadequate number of viral copies(<138 copies/mL). A negative result must be combined with clinical observations, patient history, and epidemiological information. The expected result is Negative.  Fact Sheet for Patients:  EntrepreneurPulse.com.au  Fact Sheet for Healthcare Providers:  IncredibleEmployment.be  This test is no t yet approved or cleared by the [place] FDA and  has been authorized for detection and/or diagnosis of SARS-CoV-2 by FDA under an Emergency Use Authorization (EUA). This EUA will remain  in effect (meaning this test can be used) for the duration of the COVID-19 declaration under Section 564(b)(1) of the Act, 21 U.S.C.section 360bbb-3(b)(1), unless the authorization is terminated  or revoked sooner.       Influenza A by PCR NEGATIVE NEGATIVE Final   Influenza B by PCR NEGATIVE NEGATIVE Final    Comment: (NOTE) The Xpert Xpress SARS-CoV-2/FLU/RSV plus assay is intended as an aid in the diagnosis of influenza from Nasopharyngeal swab specimens and should not be used as a sole basis for treatment. Nasal washings and aspirates are unacceptable for Xpert Xpress SARS-CoV-2/FLU/RSV testing.  Fact Sheet for Patients: EntrepreneurPulse.com.au  Fact Sheet for Healthcare  Providers: IncredibleEmployment.be  This test is not yet approved or cleared by the [place] FDA and has been authorized for detection and/or diagnosis of SARS-CoV-2 by FDA under an Emergency Use Authorization (EUA). This EUA will remain in effect (meaning this test can be used) for the duration of the COVID-19 declaration under Section 564(b)(1) of the Act, 21 U.S.C. section 360bbb-3(b)(1), unless the authorization is terminated or revoked.  Performed at [facility], [place] [address] [place], [place] [address]      Time coordinating discharge: 35 minutes The [place] controlled substances registry was reviewed for this patient      30 Day Unplanned Readmission Risk Score    Flowsheet Row ED to Hosp-Admission (Current) from 08/15/2020 in [place]  [facility] PCU  30 Day Unplanned Readmission Risk Score (%) 10.25 Filed at 08/16/2020 1200       This score is the patient's risk of an unplanned readmission within 30 days of being discharged (0 -100%). The score is based on dignosis, age, lab data, medications, orders, and past utilization.   Low:  0-14.9   Medium: 15-21.9   High: 22-29.9   Extreme: 30 and above             SIGNED:   Edwin Dada, MD  [facility] 08/16/2020, 3:01 PM

## 2020-08-16 NOTE — Care Management CC44 (Signed)
Condition Code 44 Documentation Completed  Patient Details  Name: Maria [name] MRN: [id number] Date of Birth: 05-13-1922   Condition Code 44 given:  Yes Patient signature on Condition Code 44 notice:  Yes Documentation of 2 MD's agreement:  Yes Code 44 added to claim:  Yes    Ninfa Meeker, RN 08/16/2020, 3:14 PM

## 2020-08-16 NOTE — Care Management Obs Status (Signed)
[facility] NOTIFICATION   Patient Details  Name: Maria [name] MRN: [id number] Date of Birth: 1922-12-22   Medicare Observation Status Notification Given:  Yes    Ninfa Meeker, RN 08/16/2020, 3:14 PM

## 2020-08-16 NOTE — Progress Notes (Signed)
Initial Nutrition Assessment  DOCUMENTATION CODES:   Not applicable  INTERVENTION:   -Ensure Enlive po BID, each supplement provides 350 kcal and 20 grams of protein  -MVI with minerals daily  NUTRITION DIAGNOSIS:   Inadequate oral intake related to decreased appetite as evidenced by per patient/family report.  GOAL:   Patient will meet greater than or equal to 90% of their needs  MONITOR:   PO intake, Supplement acceptance, Labs, Weight trends, Skin, I & O's  REASON FOR ASSESSMENT:   Malnutrition Screening Tool    ASSESSMENT:   Maria [name] is a 87 y.o. female with medical history significant for chronic diastolic heart failure, mild mitral regurgitation, persistent atrial fibrillation chronically anticoagulated on Eliquis, hypertension, hyperlipidemia, B12 deficiency, who is admitted to [facility] on 08/15/2020 with acute encephalopathy after presenting from home to [facility] ED complaining of generalized weakness.  Pt admitted with acute encephalopathy.   Per MD notes, pt with chronic anemia and B-12 deficiencies.  Spoke with pt, son, and husband at bedside. Pt reports that she has not eaten anything since admission, but is awaiting breakfast (rice krispies). Per pt son, pt typically has a good appetite. She consumes 2 meals per day and husband reports "we eat whatever we want". Meal are usually purchased from restaurants such as [employer] and [employer]. Pt will consume a meat, starch, and vegetable plate and also order a meal to go for later on in the day. She denies any difficulty chewing or swallowing foods.   Per son, pt ambulates with a cane and walker and has been less steady on her feet since her fall in the bathroom a few weeks ago, which caused bruising to her knees.   Pt denies any weight loss. She reports he UBW is around 150-155#. Reviewed wt hx; pt has experienced a 7.3% wt loss over the past 3 months, which is not significant for time frame.    Discussed importance of good meal and supplement intake to promote healing. She is amenable to Ensure Enlive supplements.   Noted pt with generalized fat and muscle depletions, which RD suspects are related to advanced age and decline in mobility status.   Medications reviewed and include ferrous sulfate and lasix.   Labs reviewed: Na: 134, K: 3.4. Mg and Phos WDL.    NUTRITION - FOCUSED PHYSICAL EXAM:  Flowsheet Row Most Recent Value  Orbital Region Mild depletion  Upper Arm Region Mild depletion  Thoracic and Lumbar Region No depletion  Buccal Region No depletion  Temple Region No depletion  Clavicle Bone Region Mild depletion  Clavicle and Acromion Bone Region Mild depletion  Scapular Bone Region Mild depletion  Dorsal Hand No depletion  Patellar Region No depletion  Anterior Thigh Region No depletion  Posterior Calf Region No depletion  Edema (RD Assessment) Mild  Hair Reviewed  Eyes Reviewed  Mouth Reviewed  Skin Reviewed  Nails Reviewed       Diet Order:   Diet Order             Diet regular Room service appropriate? Yes; Fluid consistency: Thin  Diet effective now                   EDUCATION NEEDS:   Education needs have been addressed  Skin:  Skin Assessment: Reviewed RN Assessment  Last BM:  Unknown  Height:   Ht Readings from Last 1 Encounters:  08/15/20 5\' 3"  (1.6 m)    Weight:   Wt  Readings from Last 1 Encounters:  08/15/20 72.1 kg    Ideal Body Weight:  52.3 kg  BMI:  Body mass index is 28.17 kg/m.  Estimated Nutritional Needs:   Kcal:  1550-1750  Protein:  85-100 grams  Fluid:  > 1.5 L    Loistine Chance, RD, LDN, [place] Registered Dietitian II Certified Diabetes Care and Education Specialist Please refer to [facility] for RD and/or RD on-call/weekend/after hours pager

## 2020-08-18 ENCOUNTER — Other Ambulatory Visit: Payer: Self-pay | Admitting: Cardiovascular Disease

## 2020-08-19 ENCOUNTER — Telehealth: Payer: Self-pay | Admitting: Cardiovascular Disease

## 2020-08-19 NOTE — Telephone Encounter (Signed)
New message:     Patient granddaughter calling to let the doctor know that the patient was in the hospital all last week, she was in AFIB, however the granddaughter would like to speak the nurse.

## 2020-08-19 NOTE — Telephone Encounter (Signed)
Spoke to patient's grand daughter Arrie Aran.She stated grand mother was taken to [facility] ED last week with confusion and slow heart beat.Stated she was in Afib.She is on Eliquis.Stated she would like her to see Dr.Berry to make sure she is on correct medications.She also was told her troponin levels were elevated.Stated she is presently ok, but something is going on with her she is not acting her normal self.Advised Dr.Berry is out of office this week.No extender appointments available this week.Appointment scheduled with Laurann Montana PA 7/18 at 2:00 pm at [facility] location.Advised if she becomes worse to go back to ED.I will make Dr.Berry aware.

## 2020-08-21 LAB — METHYLMALONIC ACID, SERUM: Methylmalonic Acid, Quantitative: 346 nmol/L (ref 0–378)

## 2020-08-26 ENCOUNTER — Ambulatory Visit (HOSPITAL_BASED_OUTPATIENT_CLINIC_OR_DEPARTMENT_OTHER): Payer: Medicare Other | Admitting: Family

## 2020-09-28 ENCOUNTER — Emergency Department (HOSPITAL_COMMUNITY): Payer: Medicare Other

## 2020-09-28 ENCOUNTER — Inpatient Hospital Stay (HOSPITAL_COMMUNITY): Payer: Medicare Other

## 2020-09-28 ENCOUNTER — Encounter (HOSPITAL_COMMUNITY): Payer: Self-pay

## 2020-09-28 ENCOUNTER — Inpatient Hospital Stay (HOSPITAL_COMMUNITY)
Admission: EM | Admit: 2020-09-28 | Discharge: 2020-10-04 | DRG: 299 | Disposition: A | Discharge status: Home Health | Payer: Medicare Other | Attending: Internal Medicine | Admitting: Internal Medicine

## 2020-09-28 ENCOUNTER — Observation Stay (HOSPITAL_COMMUNITY): Payer: Medicare Other

## 2020-09-28 ENCOUNTER — Other Ambulatory Visit: Payer: Self-pay

## 2020-09-28 DIAGNOSIS — G629 Polyneuropathy, unspecified: Secondary | ICD-10-CM | POA: Diagnosis present

## 2020-09-28 DIAGNOSIS — I219 Acute myocardial infarction, unspecified: Secondary | ICD-10-CM

## 2020-09-28 DIAGNOSIS — E876 Hypokalemia: Secondary | ICD-10-CM | POA: Diagnosis present

## 2020-09-28 DIAGNOSIS — Z885 Allergy status to narcotic agent status: Secondary | ICD-10-CM

## 2020-09-28 DIAGNOSIS — R7401 Elevation of levels of liver transaminase levels: Secondary | ICD-10-CM | POA: Diagnosis present

## 2020-09-28 DIAGNOSIS — I4891 Unspecified atrial fibrillation: Secondary | ICD-10-CM

## 2020-09-28 DIAGNOSIS — F05 Delirium due to known physiological condition: Secondary | ICD-10-CM | POA: Diagnosis not present

## 2020-09-28 DIAGNOSIS — F0280 Dementia in other diseases classified elsewhere without behavioral disturbance: Secondary | ICD-10-CM | POA: Diagnosis present

## 2020-09-28 DIAGNOSIS — I639 Cerebral infarction, unspecified: Secondary | ICD-10-CM

## 2020-09-28 DIAGNOSIS — Z7901 Long term (current) use of anticoagulants: Secondary | ICD-10-CM

## 2020-09-28 DIAGNOSIS — I7090 Unspecified atherosclerosis: Secondary | ICD-10-CM | POA: Diagnosis present

## 2020-09-28 DIAGNOSIS — D649 Anemia, unspecified: Secondary | ICD-10-CM | POA: Diagnosis present

## 2020-09-28 DIAGNOSIS — I10 Essential (primary) hypertension: Secondary | ICD-10-CM | POA: Diagnosis present

## 2020-09-28 DIAGNOSIS — U071 COVID-19: Secondary | ICD-10-CM | POA: Diagnosis present

## 2020-09-28 DIAGNOSIS — M79671 Pain in right foot: Secondary | ICD-10-CM | POA: Diagnosis present

## 2020-09-28 DIAGNOSIS — E7800 Pure hypercholesterolemia, unspecified: Secondary | ICD-10-CM | POA: Diagnosis present

## 2020-09-28 DIAGNOSIS — G309 Alzheimer's disease, unspecified: Secondary | ICD-10-CM | POA: Diagnosis present

## 2020-09-28 DIAGNOSIS — Z6828 Body mass index (BMI) 28.0-28.9, adult: Secondary | ICD-10-CM

## 2020-09-28 DIAGNOSIS — D271 Benign neoplasm of left ovary: Secondary | ICD-10-CM | POA: Diagnosis present

## 2020-09-28 DIAGNOSIS — Z91040 Latex allergy status: Secondary | ICD-10-CM

## 2020-09-28 DIAGNOSIS — R1900 Intra-abdominal and pelvic swelling, mass and lump, unspecified site: Secondary | ICD-10-CM | POA: Diagnosis not present

## 2020-09-28 DIAGNOSIS — I110 Hypertensive heart disease with heart failure: Secondary | ICD-10-CM | POA: Diagnosis present

## 2020-09-28 DIAGNOSIS — Z886 Allergy status to analgesic agent status: Secondary | ICD-10-CM

## 2020-09-28 DIAGNOSIS — J9601 Acute respiratory failure with hypoxia: Secondary | ICD-10-CM | POA: Diagnosis present

## 2020-09-28 DIAGNOSIS — E785 Hyperlipidemia, unspecified: Secondary | ICD-10-CM | POA: Diagnosis not present

## 2020-09-28 DIAGNOSIS — Z8673 Personal history of transient ischemic attack (TIA), and cerebral infarction without residual deficits: Secondary | ICD-10-CM

## 2020-09-28 DIAGNOSIS — N9489 Other specified conditions associated with female genital organs and menstrual cycle: Secondary | ICD-10-CM | POA: Diagnosis not present

## 2020-09-28 DIAGNOSIS — M810 Age-related osteoporosis without current pathological fracture: Secondary | ICD-10-CM | POA: Diagnosis present

## 2020-09-28 DIAGNOSIS — Z8249 Family history of ischemic heart disease and other diseases of the circulatory system: Secondary | ICD-10-CM

## 2020-09-28 DIAGNOSIS — I5033 Acute on chronic diastolic (congestive) heart failure: Secondary | ICD-10-CM | POA: Diagnosis present

## 2020-09-28 DIAGNOSIS — I70291 Other atherosclerosis of native arteries of extremities, right leg: Secondary | ICD-10-CM | POA: Diagnosis present

## 2020-09-28 DIAGNOSIS — E872 Acidosis: Secondary | ICD-10-CM | POA: Diagnosis present

## 2020-09-28 DIAGNOSIS — H9190 Unspecified hearing loss, unspecified ear: Secondary | ICD-10-CM | POA: Diagnosis present

## 2020-09-28 DIAGNOSIS — I70209 Unspecified atherosclerosis of native arteries of extremities, unspecified extremity: Secondary | ICD-10-CM | POA: Diagnosis present

## 2020-09-28 DIAGNOSIS — I252 Old myocardial infarction: Secondary | ICD-10-CM

## 2020-09-28 DIAGNOSIS — E669 Obesity, unspecified: Secondary | ICD-10-CM | POA: Diagnosis present

## 2020-09-28 DIAGNOSIS — E861 Hypovolemia: Secondary | ICD-10-CM | POA: Diagnosis present

## 2020-09-28 DIAGNOSIS — I480 Paroxysmal atrial fibrillation: Secondary | ICD-10-CM | POA: Diagnosis present

## 2020-09-28 DIAGNOSIS — N838 Other noninflammatory disorders of ovary, fallopian tube and broad ligament: Secondary | ICD-10-CM

## 2020-09-28 DIAGNOSIS — Z91048 Other nonmedicinal substance allergy status: Secondary | ICD-10-CM

## 2020-09-28 DIAGNOSIS — I509 Heart failure, unspecified: Secondary | ICD-10-CM | POA: Diagnosis not present

## 2020-09-28 DIAGNOSIS — I5032 Chronic diastolic (congestive) heart failure: Secondary | ICD-10-CM | POA: Diagnosis present

## 2020-09-28 DIAGNOSIS — J1282 Pneumonia due to coronavirus disease 2019: Secondary | ICD-10-CM | POA: Diagnosis present

## 2020-09-28 DIAGNOSIS — Z66 Do not resuscitate: Secondary | ICD-10-CM | POA: Diagnosis present

## 2020-09-28 DIAGNOSIS — Z87891 Personal history of nicotine dependence: Secondary | ICD-10-CM

## 2020-09-28 DIAGNOSIS — I7092 Chronic total occlusion of artery of the extremities: Secondary | ICD-10-CM | POA: Diagnosis not present

## 2020-09-28 DIAGNOSIS — Z96643 Presence of artificial hip joint, bilateral: Secondary | ICD-10-CM | POA: Diagnosis present

## 2020-09-28 DIAGNOSIS — Z90710 Acquired absence of both cervix and uterus: Secondary | ICD-10-CM

## 2020-09-28 DIAGNOSIS — I739 Peripheral vascular disease, unspecified: Secondary | ICD-10-CM | POA: Diagnosis not present

## 2020-09-28 LAB — RESP PANEL BY RT-PCR (FLU A&B, COVID) ARPGX2
Influenza A by PCR: NEGATIVE
Influenza B by PCR: NEGATIVE
SARS Coronavirus 2 by RT PCR: POSITIVE — AB

## 2020-09-28 LAB — LACTIC ACID, PLASMA
Lactic Acid, Venous: 6 mmol/L (ref 0.5–1.9)
Lactic Acid, Venous: 2.4 mmol/L (ref 0.5–1.9)

## 2020-09-28 LAB — BRAIN NATRIURETIC PEPTIDE: B Natriuretic Peptide: 755.6 pg/mL — ABNORMAL HIGH (ref 0.0–100.0)

## 2020-09-28 LAB — CBC WITH DIFFERENTIAL/PLATELET
Abs Immature Granulocytes: 0.05 10*3/uL (ref 0.00–0.07)
Basophils Absolute: 0 10*3/uL (ref 0.0–0.1)
Basophils Relative: 1 %
Eosinophils Absolute: 0.2 10*3/uL (ref 0.0–0.5)
Eosinophils Relative: 3 %
HCT: 41 % (ref 36.0–46.0)
Hemoglobin: 12.7 g/dL (ref 12.0–15.0)
Immature Granulocytes: 1 %
Lymphocytes Relative: 10 %
Lymphs Abs: 0.8 10*3/uL (ref 0.7–4.0)
MCH: 31.9 pg (ref 26.0–34.0)
MCHC: 31 g/dL (ref 30.0–36.0)
MCV: 103 fL — ABNORMAL HIGH (ref 80.0–100.0)
Monocytes Absolute: 0.5 10*3/uL (ref 0.1–1.0)
Monocytes Relative: 5 %
Neutro Abs: 7.3 10*3/uL (ref 1.7–7.7)
Neutrophils Relative %: 80 %
Platelets: 225 10*3/uL (ref 150–400)
RBC: 3.98 MIL/uL (ref 3.87–5.11)
RDW: 15 % (ref 11.5–15.5)
WBC: 8.9 10*3/uL (ref 4.0–10.5)
nRBC: 0 % (ref 0.0–0.2)

## 2020-09-28 LAB — COMPREHENSIVE METABOLIC PANEL
ALT: 21 U/L (ref 0–44)
AST: 33 U/L (ref 15–41)
Albumin: 3.5 g/dL (ref 3.5–5.0)
Alkaline Phosphatase: 96 U/L (ref 38–126)
Anion gap: 11 (ref 5–15)
BUN: 12 mg/dL (ref 8–23)
CO2: 25 mmol/L (ref 22–32)
Calcium: 8.5 mg/dL — ABNORMAL LOW (ref 8.9–10.3)
Chloride: 104 mmol/L (ref 98–111)
Creatinine, Ser: 0.97 mg/dL (ref 0.44–1.00)
GFR, Estimated: 53 mL/min — ABNORMAL LOW (ref >60)
Glucose, Bld: 169 mg/dL — ABNORMAL HIGH (ref 70–99)
Potassium: 3.5 mmol/L (ref 3.5–5.1)
Sodium: 140 mmol/L (ref 135–145)
Total Bilirubin: 0.6 mg/dL (ref 0.3–1.2)
Total Protein: 6.2 g/dL — ABNORMAL LOW (ref 6.5–8.1)

## 2020-09-28 LAB — APTT: aPTT: 73 seconds — ABNORMAL HIGH (ref 24–36)

## 2020-09-28 LAB — HEPARIN LEVEL (UNFRACTIONATED): Heparin Unfractionated: 0.24 IU/mL — ABNORMAL LOW (ref 0.30–0.70)

## 2020-09-28 MED ORDER — METOPROLOL TARTRATE 5 MG/5ML IV SOLN
5.0000 mg | Freq: Once | INTRAVENOUS | Status: AC
  Administered 2020-09-28: 5 mg via INTRAVENOUS
  Filled 2020-09-28: qty 5

## 2020-09-28 MED ORDER — FENTANYL CITRATE PF 50 MCG/ML IJ SOSY: 25.0000 ug | PREFILLED_SYRINGE | INTRAMUSCULAR | Status: DC | PRN

## 2020-09-28 MED ORDER — FUROSEMIDE 40 MG PO TABS
80.0000 mg | ORAL_TABLET | Freq: Every day | ORAL | Status: DC
  Administered 2020-09-29 – 2020-09-30 (×2): 80 mg via ORAL
  Filled 2020-09-28 (×2): qty 2

## 2020-09-28 MED ORDER — HYDROCORTISONE 1 % EX CREA
1.0000 "application " | TOPICAL_CREAM | Freq: Two times a day (BID) | CUTANEOUS | Status: DC | PRN
  Filled 2020-09-28: qty 28

## 2020-09-28 MED ORDER — SODIUM CHLORIDE 0.9 % IV BOLUS
500.0000 mL | Freq: Once | INTRAVENOUS | Status: AC
  Administered 2020-09-28: 500 mL via INTRAVENOUS

## 2020-09-28 MED ORDER — IOHEXOL 350 MG/ML SOLN
100.0000 mL | Freq: Once | INTRAVENOUS | Status: AC | PRN
  Administered 2020-09-28: 100 mL via INTRAVENOUS

## 2020-09-28 MED ORDER — ONDANSETRON HCL 4 MG PO TABS: 4.0000 mg | ORAL_TABLET | Freq: Four times a day (QID) | ORAL | Status: DC | PRN

## 2020-09-28 MED ORDER — ONDANSETRON HCL 4 MG/2ML IJ SOLN
4.0000 mg | Freq: Once | INTRAMUSCULAR | Status: AC
  Administered 2020-09-28: 4 mg via INTRAVENOUS
  Filled 2020-09-28: qty 2

## 2020-09-28 MED ORDER — ONDANSETRON HCL 4 MG/2ML IJ SOLN: 4.0000 mg | Freq: Four times a day (QID) | INTRAMUSCULAR | Status: DC | PRN

## 2020-09-28 MED ORDER — GADOBUTROL 1 MMOL/ML IV SOLN
7.0000 mL | Freq: Once | INTRAVENOUS | Status: AC | PRN
  Administered 2020-09-28: 7 mL via INTRAVENOUS

## 2020-09-28 MED ORDER — FENTANYL CITRATE (PF) 100 MCG/2ML IJ SOLN: 25.0000 ug | INTRAMUSCULAR | Status: DC | PRN

## 2020-09-28 MED ORDER — SODIUM CHLORIDE 0.9% FLUSH
3.0000 mL | Freq: Two times a day (BID) | INTRAVENOUS | Status: DC
  Administered 2020-09-28 – 2020-10-04 (×9): 3 mL via INTRAVENOUS

## 2020-09-28 MED ORDER — FENTANYL CITRATE PF 50 MCG/ML IJ SOSY
25.0000 ug | PREFILLED_SYRINGE | Freq: Once | INTRAMUSCULAR | Status: AC
  Administered 2020-09-28: 25 ug via INTRAVENOUS
  Filled 2020-09-28: qty 1

## 2020-09-28 MED ORDER — HEPARIN (PORCINE) 25000 UT/250ML-% IV SOLN
1350.0000 [IU]/h | INTRAVENOUS | Status: DC
  Administered 2020-09-28: 1200 [IU]/h via INTRAVENOUS
  Filled 2020-09-28 (×2): qty 250

## 2020-09-28 MED ORDER — ALBUTEROL SULFATE (2.5 MG/3ML) 0.083% IN NEBU: 2.5000 mg | INHALATION_SOLUTION | Freq: Four times a day (QID) | RESPIRATORY_TRACT | Status: DC | PRN

## 2020-09-28 MED ORDER — EZETIMIBE 10 MG PO TABS
10.0000 mg | ORAL_TABLET | Freq: Every day | ORAL | Status: DC
  Administered 2020-09-28 – 2020-10-04 (×7): 10 mg via ORAL
  Filled 2020-09-28 (×7): qty 1

## 2020-09-28 MED ORDER — AMLODIPINE BESYLATE 5 MG PO TABS
5.0000 mg | ORAL_TABLET | Freq: Every day | ORAL | Status: DC
  Administered 2020-09-29 – 2020-10-04 (×6): 5 mg via ORAL
  Filled 2020-09-28 (×6): qty 1

## 2020-09-28 NOTE — ED Notes (Addendum)
Critical result lactic acid 6.0

## 2020-09-28 NOTE — ED Notes (Signed)
Received call from lab that pt is COVID positive. Primary RN notified

## 2020-09-28 NOTE — ED Notes (Signed)
Patient transported to MRI 

## 2020-09-28 NOTE — ED Notes (Signed)
Patient transported to [place] placed once situated in bed.

## 2020-09-28 NOTE — ED Notes (Signed)
Pt requiring 2 assists to pivot back into bed after using bedside commode.

## 2020-09-28 NOTE — Progress Notes (Signed)
Patient brought to 4E from ED. Telemetry box applied, CCMD notified. VSS. Patient oriented to room and staff. Call bell in reach.  Husband present.  Daymon Larsen, RN

## 2020-09-28 NOTE — ED Notes (Addendum)
Pt is very demanding and speaking aggressively toward staff, refusing bed pan and demanding that she be allow to get up. Pt impatient with staff and as supplies are being gathered to attempt to get pt onto bedside commode. Pt uncooperative with staff. Heparin had to be paused so that pt does not pull out IV trying to get up.

## 2020-09-28 NOTE — ED Notes (Signed)
Attempting to take o2 off and obtain a room air saturation

## 2020-09-28 NOTE — ED Triage Notes (Signed)
Woke up got in to the shower and stated having right foot pain that has since traveled up to her right knee. Unable to feel  pulse to that food.

## 2020-09-28 NOTE — H&P (Addendum)
History and Physical    Maria [name] [id number] DOB: April 21, 1922 DOA: 09/28/2020  Referring MD/NP/PA: Isla Pence, MD PCP: Jani Gravel, MD  Patient coming from: Home  Chief Complaint: Right leg pain  I have personally briefly reviewed patient's old medical records in [place]   HPI: Maria [name] is a 87 y.o. female with medical history significant of hypertension, hyperlipidemia, paroxysmal atrial fibrillation, anemia, and diastolic CHF last EF 60 to 65% presents with complaints of right leg pain that started possibly around 4:30 AM this.  History is limited somewhat due to the patient being hard of hearing. Patient is able to ambulate with use of a cane.  She had gotten up this morning to take a shower and reported right foot pain that traveled up her leg.  She has been on Eliquis without missing doses, and reports last taking it yesterday night around 8 PM.  Otherwise patient had been in her normal state of than issues.  Denies having any chest pain, shortness of breath, abdominal pain, nausea, vomiting, or diarrhea symptoms.    She had been last admitted into the hospital in July with complaints of altered mental status.  Work-up did not show any signs of infection or concern for fluid overload.  Patient was noted to have some brief intermittent episodes of apnea with sleeping, but was able to be discharged home on room air.  Records note patient had last had EGD and colonoscopy for hematochezia in 2020 which showed 2 gastric, 1 duodenal , and 1 transverse colon sessile polyp which were all removed.   ED Course: On admission patient was seen to be afebrile with pulse 82-97, respiration 19-25, blood pressure 82/72-145/90, and O2 saturations as low as 89% with improvement on 2 L of nasal cannula oxygen.  Labs significant for lactic acid 6->2.4.  CT angio aortobifemoral significant for occluded right common and superficial femoral arteries, occluded distal left  popliteal artery and tibioperoneal trunk, complete to near complete occlusion of the origin of the iliac artery,  70% stenosis of the superior mesenteric artery, high-grade stenosis at the origin of the right renal artery, and 10.2 x 4.8 x 9 cm left adnexal mass.  Vascular surgery was formally consulted patient was started on heparin per pharmacy.  Patient also received 500 mL normal saline bolus, Zofran IV, and fentanyl for pain.   Review of Systems  Constitutional:  Negative for fever.  HENT:  Positive for hearing loss.   Eyes:  Negative for photophobia and pain.  Respiratory:  Negative for cough and shortness of breath.   Cardiovascular:  Negative for chest pain and leg swelling.  Gastrointestinal:  Negative for abdominal pain, nausea and vomiting.  Genitourinary:  Negative for dysuria and hematuria.  Musculoskeletal:  Positive for myalgias. Negative for falls.  Neurological:  Negative for focal weakness and loss of consciousness.  Endo/Heme/Allergies:  Negative for polydipsia.  Psychiatric/Behavioral:  Negative for substance abuse.    Past Medical History:  Diagnosis Date   Allergic urticaria    Anemia    Arthritis    OA AND PAIN LEFT HIP   Blood transfusion without reported diagnosis    Cataract    CHF (congestive heart failure) (HCC)    Chronic diastolic heart failure   Glaucoma    Headache(784.0)    HX OF MIGRAINES   Hypercholesterolemia    Hypertension    Myocardial infarction [facility])    Osteoporosis    Paroxysmal atrial fibrillation ([place])    Peripheral  neuropathy    PONV (postoperative nausea and vomiting)    Rib fractures    IN THE PAST   Shingles 1940'S   Stroke ([place])    TIA (transient ischemic attack)     Past Surgical History:  Procedure Laterality Date   [place] OF UTERUS     MULTIPLE   EXTERNAL FIXATOR LEFT [place] & 2006   BILATERAL CATARACT EXTRACTION    JOINT REPLACEMENT  2011   RIGHT TOTAL HIP REPLACEMENT   TONSILLECTOMY  1934   TOTAL HIP ARTHROPLASTY  05/27/2011   Procedure: TOTAL HIP ARTHROPLASTY;  Surgeon: Gearlean Alf, MD;  Location: [facility] ORS;  Service: Orthopedics;  Laterality: Left;     reports that she has quit smoking. She has never used smokeless tobacco. She reports that she does not drink alcohol and does not use drugs.  Allergies  Allergen Reactions   Codeine Nausea And Vomiting   Lisinopril Other (See Comments)    Unknown   Tylenol [Acetaminophen] Itching   Ibuprofen Itching and Rash   Latex Rash and Other (See Comments)    Usually breaks out the skin   Tape Rash and Other (See Comments)    Usually breaks out the skin    Family History  Problem Relation Age of Onset   Heart disease Mother    Stroke Mother    Other Father 14       MVA   Stroke Sister    Stroke Brother    Atrial fibrillation Son    Other Son 12       fire   Colon cancer Neg Hx    Esophageal cancer Neg Hx    Rectal cancer Neg Hx    Stomach cancer Neg Hx     Prior to Admission medications   Medication Sig Start Date End Date Taking? Authorizing Provider  amLODipine (NORVASC) 5 MG tablet Take 1 tablet by mouth once daily Patient taking differently: Take 5 mg by mouth daily. 08/19/20  Yes Lorretta Harp, MD  Cyanocobalamin (VITAMIN B-12 PO) Take 1 tablet by mouth daily with breakfast.    Yes [provider]  ELIQUIS 5 MG TABS tablet Take 1 tablet by mouth twice daily Patient taking differently: Take 5 mg by mouth 2 (two) times daily. 05/30/20  Yes Lorretta Harp, MD  ezetimibe (ZETIA) 10 MG tablet Take 10 mg by mouth daily. 04/29/19  Yes [provider]  ferrous sulfate 325 (65 FE) MG tablet Take 1 tablet (325 mg total) by mouth 2 (two) times daily after a meal. 07/11/18  Yes Ladene Artist, MD  furosemide (LASIX) 80 MG tablet TAKE 1 TABLET BY MOUTH ONCE DAILY(SCHEDULE APPT FOR FUTURE REFILLS) Patient taking differently:  Take 80 mg by mouth daily. 08/19/20  Yes Lorretta Harp, MD  hydrocortisone cream 1 % Apply 1 application topically 2 (two) times daily as needed (for itching- apply to affected areas).    Yes [provider]  Multiple Vitamins-Minerals (PRESERVISION AREDS PO) Take 1 tablet by mouth 2 (two) times daily.   Yes [provider]  COVID-19 mRNA vaccine, Pfizer, 30 MCG/0.3ML injection USE AS DIRECTED 12/27/19 12/26/20  Carlyle Basques, MD    Physical Exam:  Constitutional: Elderly female currently in no acute distress Vitals:   09/28/20 1200 09/28/20 1230 09/28/20 1300 09/28/20 1330  BP: 136/86 131/89 (!) 126/116 Marland Kitchen)  132/96  Pulse: 82 83 97 88  Resp: 19 (!) 21 (!) 23 (!) 22  Temp:      SpO2: 90% 100% 100% 100%  Weight:      Height:       Eyes: PERRL, lids and conjunctivae normal ENMT: Mucous membranes are dry.  Posterior pharynx clear of any exudate or lesions.hard of hearing. Neck: normal, supple, no masses, no thyromegaly Respiratory: clear to auscultation bilaterally, no wheezing, no crackles. Normal respiratory effort. No accessory muscle use.  Cardiovascular: Regular rate and rhythm, no murmurs / rubs / gallops. No extremity edema. 2+ pedal pulses. No carotid bruits.  Abdomen: no tenderness, no masses palpated. No hepatosplenomegaly. Bowel sounds positive.  Musculoskeletal: no clubbing / cyanosis. No joint deformity upper and lower extremities. Good ROM, no contractures. Normal muscle tone.  Skin: no rashes, lesions, ulcers. No induration Neurologic: CN 2-12 grossly intact. Sensation intact, DTR normal. Strength 5/5 in all 4.  Psychiatric: Normal judgment and insight. Alert and oriented x 3. Normal mood.     Labs on Admission: I have personally reviewed following labs and imaging studies  CBC: Recent Labs  Lab 09/28/20 0731  WBC 8.9  NEUTROABS 7.3  HGB 12.7  HCT 41.0  MCV 103.0*  PLT 123456   Basic Metabolic Panel: Recent Labs  Lab 09/28/20 0731  NA  140  K 3.5  CL 104  CO2 25  GLUCOSE 169*  BUN 12  CREATININE 0.97  CALCIUM 8.5*   GFR: Estimated Creatinine Clearance: 32.3 mL/min (by C-G formula based on SCr of 0.97 mg/dL). Liver Function Tests: Recent Labs  Lab 09/28/20 0731  AST 33  ALT 21  ALKPHOS 96  BILITOT 0.6  PROT 6.2*  ALBUMIN 3.5   No results for input(s): LIPASE, AMYLASE in the last 168 hours. No results for input(s): AMMONIA in the last 168 hours. Coagulation Profile: No results for input(s): INR, PROTIME in the last 168 hours. Cardiac Enzymes: No results for input(s): CKTOTAL, CKMB, CKMBINDEX, TROPONINI in the last 168 hours. BNP (last 3 results) No results for input(s): PROBNP in the last 8760 hours. HbA1C: No results for input(s): HGBA1C in the last 72 hours. CBG: No results for input(s): GLUCAP in the last 168 hours. Lipid Profile: No results for input(s): CHOL, HDL, LDLCALC, TRIG, CHOLHDL, LDLDIRECT in the last 72 hours. Thyroid Function Tests: No results for input(s): TSH, T4TOTAL, FREET4, T3FREE, THYROIDAB in the last 72 hours. Anemia Panel: No results for input(s): VITAMINB12, FOLATE, FERRITIN, TIBC, IRON, RETICCTPCT in the last 72 hours. Urine analysis:    Component Value Date/Time   COLORURINE YELLOW 08/15/2020 1524   APPEARANCEUR CLEAR 08/15/2020 1524   LABSPEC 1.009 08/15/2020 1524   PHURINE 7.0 08/15/2020 1524   GLUCOSEU NEGATIVE 08/15/2020 1524   HGBUR SMALL (A) 08/15/2020 1524   BILIRUBINUR NEGATIVE 08/15/2020 1524   KETONESUR NEGATIVE 08/15/2020 1524   PROTEINUR NEGATIVE 08/15/2020 1524   UROBILINOGEN 0.2 05/14/2011 1554   NITRITE NEGATIVE 08/15/2020 1524   LEUKOCYTESUR NEGATIVE 08/15/2020 1524   Sepsis Labs: No results found for this or any previous visit (from the past 240 hour(s)).   Radiological Exams on Admission: CT Angio Aortobifemoral W and/or Wo Contrast  Result Date: 09/28/2020 CLINICAL DATA:  New onset right foot pain traveling up to right knee which began this  morning. Unable to palpate pedal pulses. EXAM: CT ANGIOGRAPHY OF ABDOMINAL AORTA WITH ILIOFEMORAL RUNOFF TECHNIQUE: Multidetector CT imaging of the abdomen, pelvis and lower extremities was performed using the standard protocol  during bolus administration of intravenous contrast. Multiplanar CT image reconstructions and MIPs were obtained to evaluate the vascular anatomy. CONTRAST:  181m OMNIPAQUE IOHEXOL 350 MG/ML SOLN COMPARISON:  None. FINDINGS: VASCULAR Aorta: Diffuse calcified atheromatous plaque without aneurysm or flow-limiting stenosis. 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 The left gastric, common hepatic and splenic arteries are patent. SMA: 70% stenosis at the origin of the superior mesenteric artery predominately due to noncalcified atheromatous plaque. The artery is patent beyond this region focal stenosis. Renals: Severe stenosis at the origin of the right main renal artery. Mild narrowing at the origin of the left main renal artery. IMA: Severe stenosis of the origin of the inferior mesenteric artery which is otherwise patent. RIGHT Lower Extremity Inflow: Approximately 60% stenosis at the origin of the common iliac artery due to calcified plaque. Common iliac artery otherwise diffusely calcified but patent. No significant narrowing of the internal iliac artery. Focal stenosis of the mid external iliac artery measuring approximately 50% in its mid segment. Outflow: Evaluation the common femoral artery is limited due to streak artifact from a total hip prosthesis. The common femoral artery appears occluded. There is flow within the Profunda femoris artery likely due to collateral flow. The superficial femoral artery is occluded. There is reconstitution of flow within the popliteal artery on the delayed phase imaging. There is focal stenosis of the distal popliteal artery measuring approximately 70%. Runoff: Tibioperoneal  trunk is patent. There is discontinuous opacification of the posterior tibial artery. No significant ossification of the anterior tibial artery. The peroneal artery is patent to the ankle. LEFT Lower Extremity Inflow: Approximately 50% stenosis at the origin of the common iliac artery due to calcified plaque. Common iliac artery is diffusely calcified but otherwise patent. Moderate to severe stenosis at the origin of the internal iliac artery which is otherwise patent. No significant stenosis of the external iliac artery. Outflow: No significant stenosis of the common femoral or Profunda femoris arteries. Focal stenosis of the proximal superficial femoral artery measuring up to 40%. Multiple additional sites of mild stenosis seen in the distal segment of the SFA measuring up to 30%. Left popliteal artery is patent in its proximal and mid segment but occludes in its distal segment. Runoff: The tibioperoneal trunk is occluded. There is faint reconstitution of flow in the anterior tibial artery which is occluded from mid to distal lower leg. Flow reconstitutes within the proximal posterior tibial and peroneal arteries which are patent to the level of the ankle. Veins: No obvious venous abnormality within the limitations of this arterial phase study. Review of the MIP images confirms the above findings. NON-VASCULAR Lower chest: Mild cardiomegaly.  Mild bibasilar atelectasis. Hepatobiliary: No focal liver abnormality is seen. No gallstones, gallbladder wall thickening, or biliary dilatation. Pancreas: Diffuse fatty atrophy of the pancreas. Spleen: Normal in size without focal abnormality. Adrenals/Urinary Tract: Right adrenal gland is not well visualized. No significant abnormality of the left adrenal gland. Hyperdensity within the renal collecting systems most likely due to early excretion of contrast rather than medullary nephrocalcinosis. Bladder evaluation limited due to streak artifact from bilateral total hip  prostheses. Stomach/Bowel: Diffuse colonic diverticulosis, most extensive in the sigmoid. No evidence of acute diverticulitis. Appendix is not definitively visualized. Large duodenal diverticulum is noted. No small bowel dilatation. Lymphatic: No enlarged abdominal or pelvic lymph nodes. Reproductive: A lobulated mass is noted in the left adnexa measuring 10.2 x 4.8 x 5.9 cm.  The exact origin of this mass is difficult to determine, however it is most likely arising from the left ovary. Other: No abdominal wall hernia or abnormality. No abdominopelvic ascites. Musculoskeletal: Bilateral total hip prostheses are present. Degenerative changes are seen throughout the lumbar spine. IMPRESSION: VASCULAR 1. 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 Flow reconstitutes within the popliteal artery. Single-vessel runoff to the right ankle through the peroneal artery. 2. Occluded distal left popliteal artery and tibioperoneal trunk. Two vessel runoff to the left ankle through posterior tibial and peroneal arteries. 3. 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 70% stenosis at the origin of the superior mesenteric artery. High-grade stenosis of the origin of the right renal artery. NON-VASCULAR 1. 10.2 x 4.8 x 5.9 cm left adnexal mass most likely of ovarian origin. This highly suspicious for malignancy. Gynecological consultation is recommended. Electronically Signed   By: Miachel Roux M.D.   On: 09/28/2020 11:01    EKG: Independently reviewed.  Atrial fibrillation at 95 bpm  Assessment/Plan  Arterial occlusion: Acute.  Patient presents with complaints of right foot and leg pain this morning.  Found to have multiple occlusions on CTA despite patient being on Eliquis.  Vascular surgery was formally consulted. -Admit to a telemetry bed -Continue heparin per pharmacy -Fentanyl IV as needed for pain  Left adnexal mass:Acute.  Patient incidentally found to have a 10.2 x 4.8 x  5.9cm left adnexal mass possibly arising from the left ovary in the evaluation of with pain on CTA.  Case discussed with gynecology-oncology Delsa Sale, MD recommended -Check MRI pelvis with and without contrast -Check CEA and CA -125 -Follow-up with gynecology for further recommendations.  Lactic acidosis: Acute.  On admission lactic acid noted to be elevated at 6 with repeat 2.4 after IV fluids. -Follow-up urinalysis -Trend lactic acid level  Paroxysmal atrial fibrillation on chronic anticoagulation: Patient currently rate controlled at 95 bpm.  Last dose of Eliquis was 8 PM last night.   -Held Eliquis and patient started on heparin  Diastolic congestive heart failure: Chronic.  Patient does not appear grossly fluid overloaded at this time.  Echocardiogram with severe left ventricular hypertrophy and indeterminate diastolic parameters. -Strict intake and output -Daily weights -Check chest x-ray -Continue furosemide  Essential hypertension: Home blood pressure medications include amlodipine 5 mg daily and furosemide 80 mg daily. -Continue amlodipine and furosemide  Dyslipidemia -Continue Zetia  DVT prophylaxis: Heparin Code Status: Full Family Communication: Husband updated at bedside Disposition Plan: To be determined Consults called: Vascular surgery Admission status: Inpatient, require more than 2 midnight stay for need of IV heparin and investigation of left adnexal mass  Norval Morton MD [facility]   If 7PM-7AM, please contact night-coverage   09/28/2020, 2:08 PM

## 2020-09-28 NOTE — ED Notes (Signed)
RA saturations maintained in the high 90's currently 97%RA

## 2020-09-28 NOTE — ED Provider Notes (Signed)
[name] Provider Note   CSN: [id number] Arrival date & time: 09/28/20  [id number]     History Chief Complaint  Patient presents with   Foot Pain    Maria [name] is a 87 y.o. female.  Pt presents to the ED today with right leg pain.  Pt said she woke up normally and started having right foot pain.  Now the pain has traveled up her leg.  She denies any trauma. No sob or cp.  She is on Eliquis for afib.  She took her dose like usual last night.  No skipped doses.      Past Medical History:  Diagnosis Date   Allergic urticaria    Anemia    Arthritis    OA AND PAIN LEFT HIP   Blood transfusion without reported diagnosis    Cataract    CHF (congestive heart failure) (HCC)    Chronic diastolic heart failure   Glaucoma    Headache(784.0)    HX OF MIGRAINES   Hypercholesterolemia    Hypertension    Myocardial infarction ([place])    Osteoporosis    Paroxysmal atrial fibrillation (HCC)    Peripheral neuropathy    PONV (postoperative nausea and vomiting)    Rib fractures    IN THE PAST   Shingles 1940'S   Stroke [facility])    TIA (transient ischemic attack)     Patient Active Problem List   Diagnosis Date Noted   Acute respiratory failure with hypoxia ([place]) 08/16/2020   Generalized weakness 08/16/2020   Elevated troponin 08/16/2020   Chronic diastolic CHF (congestive heart failure) ([place]) 08/16/2020   Acute encephalopathy 08/15/2020   Paroxysmal atrial fibrillation ([place]) 01/29/2017   ARF (acute renal failure) ([place]) 11/25/2016   Hypertensive urgency    TIA (transient ischemic attack) 03/12/2015   Dependent edema 03/12/2015   Dysphagia    Hyperlipidemia 11/16/2014   Essential hypertension 11/16/2014   Postop Acute blood loss anemia 06/01/2011   Osteoarthritis of hip 05/27/2011    Past Surgical History:  Procedure Laterality Date   [place] AND CURETTAGE OF UTERUS      MULTIPLE   EXTERNAL FIXATOR LEFT [place] & 2006   BILATERAL CATARACT EXTRACTION   JOINT REPLACEMENT  2011   RIGHT TOTAL HIP REPLACEMENT   TONSILLECTOMY  1934   TOTAL HIP ARTHROPLASTY  05/27/2011   Procedure: TOTAL HIP ARTHROPLASTY;  Surgeon: Gearlean Alf, MD;  Location: [facility] ORS;  Service: Orthopedics;  Laterality: Left;     OB History   No obstetric history on file.     Family History  Problem Relation Age of Onset   Heart disease Mother    Stroke Mother    Other Father 16       MVA   Stroke Sister    Stroke Brother    Atrial fibrillation Son    Other Son 12       fire   Colon cancer Neg Hx    Esophageal cancer Neg Hx    Rectal cancer Neg Hx    Stomach cancer Neg Hx     Social History   Tobacco Use   Smoking status: Former   Smokeless tobacco: Never   Tobacco comments:    50 YRS AGO  Vaping Use   Vaping Use: Never used  Substance Use Topics   Alcohol use: No  Drug use: No    Home Medications Prior to Admission medications   Medication Sig Start Date End Date Taking? Authorizing Provider  amLODipine (NORVASC) 5 MG tablet Take 1 tablet by mouth once daily Patient taking differently: Take 5 mg by mouth daily. 08/19/20  Yes Lorretta Harp, MD  Cyanocobalamin (VITAMIN B-12 PO) Take 1 tablet by mouth daily with breakfast.    Yes [provider]  ELIQUIS 5 MG TABS tablet Take 1 tablet by mouth twice daily Patient taking differently: Take 5 mg by mouth 2 (two) times daily. 05/30/20  Yes Lorretta Harp, MD  ezetimibe (ZETIA) 10 MG tablet Take 10 mg by mouth daily. 04/29/19  Yes [provider]  ferrous sulfate 325 (65 FE) MG tablet Take 1 tablet (325 mg total) by mouth 2 (two) times daily after a meal. 07/11/18  Yes Ladene Artist, MD  furosemide (LASIX) 80 MG tablet TAKE 1 TABLET BY MOUTH ONCE DAILY(SCHEDULE APPT FOR FUTURE REFILLS) Patient taking differently: Take 80 mg by mouth daily. 08/19/20  Yes Lorretta Harp, MD  hydrocortisone cream 1 % Apply 1 application topically 2 (two) times daily as needed (for itching- apply to affected areas).    Yes [provider]  Multiple Vitamins-Minerals (PRESERVISION AREDS PO) Take 1 tablet by mouth 2 (two) times daily.   Yes [provider]  COVID-19 mRNA vaccine, Pfizer, 30 MCG/0.3ML injection USE AS DIRECTED 12/27/19 12/26/20  Carlyle Basques, MD    Allergies    Codeine, Lisinopril, Tylenol [acetaminophen], Ibuprofen, Latex, and Tape  Review of Systems   Review of Systems  Musculoskeletal:        Right leg pain  All other systems reviewed and are negative.  Physical Exam Updated Vital Signs BP (!) 133/100   Pulse (!) 141   Temp 97.8 F (36.6 C)   Resp (!) 22   Ht '5\' 3"'$  (1.6 m)   Wt 72.1 kg   SpO2 95%   BMI 28.16 kg/m   Physical Exam Vitals and nursing note reviewed.  Constitutional:      Appearance: Normal appearance.  HENT:     Head: Normocephalic and atraumatic.     Right Ear: External ear normal.     Left Ear: External ear normal.     Nose: Nose normal.     Mouth/Throat:     Mouth: Mucous membranes are moist.     Pharynx: Oropharynx is clear.  Eyes:     Extraocular Movements: Extraocular movements intact.     Conjunctiva/sclera: Conjunctivae normal.     Pupils: Pupils are equal, round, and reactive to light.  Cardiovascular:     Rate and Rhythm: Normal rate. Rhythm irregular.     Heart sounds: Normal heart sounds.  Pulmonary:     Effort: Pulmonary effort is normal.     Breath sounds: Normal breath sounds.  Abdominal:     General: Abdomen is flat. Bowel sounds are normal.     Palpations: Abdomen is soft.  Musculoskeletal:        General: Normal range of motion.     Cervical back: Normal range of motion and neck supple.  Skin:    Capillary Refill: Capillary refill takes less than 2 seconds.     Comments: Right leg is discolored, cooler to touch, no pulses dopplerable  Neurological:     General:  No focal deficit present.     Mental Status: She is alert and oriented to person, place, and time.  Psychiatric:  Mood and Affect: Mood normal.        Behavior: Behavior normal.    ED Results / Procedures / Treatments   Labs (all labs ordered are listed, but only abnormal results are displayed) Labs Reviewed  COMPREHENSIVE METABOLIC PANEL - Abnormal; Notable for the following components:      Result Value   Glucose, Bld 169 (*)    Calcium 8.5 (*)    Total Protein 6.2 (*)    GFR, Estimated 53 (*)    All other components within normal limits  CBC WITH DIFFERENTIAL/PLATELET - Abnormal; Notable for the following components:   MCV 103.0 (*)    All other components within normal limits  LACTIC ACID, PLASMA - Abnormal; Notable for the following components:   Lactic Acid, Venous 6.0 (*)    All other components within normal limits  LACTIC ACID, PLASMA - Abnormal; Notable for the following components:   Lactic Acid, Venous 2.4 (*)    All other components within normal limits  URINALYSIS, ROUTINE W REFLEX MICROSCOPIC  HEPARIN LEVEL (UNFRACTIONATED)  APTT    EKG EKG Interpretation  Date/Time:  Saturday September 28 2020 07:43:24 EDT Ventricular Rate:  95 PR Interval:    QRS Duration: 99 QT Interval:  352 QTC Calculation: 443 R Axis:   96 Text Interpretation: Atrial fibrillation Ventricular premature complex Anteroseptal infarct, age indeterminate No significant change since last tracing Confirmed by Isla Pence [phone]) on 09/28/2020 7:48:04 AM  Radiology CT Angio Aortobifemoral W and/or Wo Contrast  Result Date: 09/28/2020 CLINICAL DATA:  New onset right foot pain traveling up to right knee which began this morning. Unable to palpate pedal pulses. EXAM: CT ANGIOGRAPHY OF ABDOMINAL AORTA WITH ILIOFEMORAL RUNOFF TECHNIQUE: Multidetector CT imaging of the abdomen, pelvis and lower extremities was performed using the standard protocol during bolus administration of intravenous  contrast. Multiplanar CT image reconstructions and MIPs were obtained to evaluate the vascular anatomy. CONTRAST:  158m OMNIPAQUE IOHEXOL 350 MG/ML SOLN COMPARISON:  None. FINDINGS: VASCULAR Aorta: Diffuse calcified atheromatous plaque without aneurysm or flow-limiting stenosis. 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 The left gastric, common hepatic and splenic arteries are patent. SMA: 70% stenosis at the origin of the superior mesenteric artery predominately due to noncalcified atheromatous plaque. The artery is patent beyond this region focal stenosis. Renals: Severe stenosis at the origin of the right main renal artery. Mild narrowing at the origin of the left main renal artery. IMA: Severe stenosis of the origin of the inferior mesenteric artery which is otherwise patent. RIGHT Lower Extremity Inflow: Approximately 60% stenosis at the origin of the common iliac artery due to calcified plaque. Common iliac artery otherwise diffusely calcified but patent. No significant narrowing of the internal iliac artery. Focal stenosis of the mid external iliac artery measuring approximately 50% in its mid segment. Outflow: Evaluation the common femoral artery is limited due to streak artifact from a total hip prosthesis. The common femoral artery appears occluded. There is flow within the Profunda femoris artery likely due to collateral flow. The superficial femoral artery is occluded. There is reconstitution of flow within the popliteal artery on the delayed phase imaging. There is focal stenosis of the distal popliteal artery measuring approximately 70%. Runoff: Tibioperoneal trunk is patent. There is discontinuous opacification of the posterior tibial artery. No significant ossification of the anterior tibial artery. The peroneal artery is patent to the ankle. LEFT Lower Extremity Inflow: Approximately 50% stenosis  at the origin of the common  iliac artery due to calcified plaque. Common iliac artery is diffusely calcified but otherwise patent. Moderate to severe stenosis at the origin of the internal iliac artery which is otherwise patent. No significant stenosis of the external iliac artery. Outflow: No significant stenosis of the common femoral or Profunda femoris arteries. Focal stenosis of the proximal superficial femoral artery measuring up to 40%. Multiple additional sites of mild stenosis seen in the distal segment of the SFA measuring up to 30%. Left popliteal artery is patent in its proximal and mid segment but occludes in its distal segment. Runoff: The tibioperoneal trunk is occluded. There is faint reconstitution of flow in the anterior tibial artery which is occluded from mid to distal lower leg. Flow reconstitutes within the proximal posterior tibial and peroneal arteries which are patent to the level of the ankle. Veins: No obvious venous abnormality within the limitations of this arterial phase study. Review of the MIP images confirms the above findings. NON-VASCULAR Lower chest: Mild cardiomegaly.  Mild bibasilar atelectasis. Hepatobiliary: No focal liver abnormality is seen. No gallstones, gallbladder wall thickening, or biliary dilatation. Pancreas: Diffuse fatty atrophy of the pancreas. Spleen: Normal in size without focal abnormality. Adrenals/Urinary Tract: Right adrenal gland is not well visualized. No significant abnormality of the left adrenal gland. Hyperdensity within the renal collecting systems most likely due to early excretion of contrast rather than medullary nephrocalcinosis. Bladder evaluation limited due to streak artifact from bilateral total hip prostheses. Stomach/Bowel: Diffuse colonic diverticulosis, most extensive in the sigmoid. No evidence of acute diverticulitis. Appendix is not definitively visualized. Large duodenal diverticulum is noted. No small bowel dilatation. Lymphatic: No enlarged abdominal or pelvic  lymph nodes. Reproductive: A lobulated mass is noted in the left adnexa measuring 10.2 x 4.8 x 5.9 cm. The exact origin of this mass is difficult to determine, however it is most likely arising from the left ovary. Other: No abdominal wall hernia or abnormality. No abdominopelvic ascites. Musculoskeletal: Bilateral total hip prostheses are present. Degenerative changes are seen throughout the lumbar spine. IMPRESSION: VASCULAR 1. 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 Flow reconstitutes within the popliteal artery. Single-vessel runoff to the right ankle through the peroneal artery. 2. Occluded distal left popliteal artery and tibioperoneal trunk. Two vessel runoff to the left ankle through posterior tibial and peroneal arteries. 3. 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 70% stenosis at the origin of the superior mesenteric artery. High-grade stenosis of the origin of the right renal artery. NON-VASCULAR 1. 10.2 x 4.8 x 5.9 cm left adnexal mass most likely of ovarian origin. This highly suspicious for malignancy. Gynecological consultation is recommended. Electronically Signed   By: Miachel Roux M.D.   On: 09/28/2020 11:01    Procedures Procedures   Medications Ordered in ED Medications  heparin ADULT infusion 100 units/mL (25000 units/294m) (1,200 Units/hr Intravenous New Bag/Given 09/28/20 1226)  metoprolol tartrate (LOPRESSOR) injection 5 mg (has no administration in time range)  ondansetron (ZOFRAN) injection 4 mg (4 mg Intravenous Given 09/28/20 0744)  fentaNYL (SUBLIMAZE) injection 25 mcg (25 mcg Intravenous Given 09/28/20 0746)  sodium chloride 0.9 % bolus 500 mL (500 mLs Intravenous Bolus 09/28/20 1026)  iohexol (OMNIPAQUE) 350 MG/ML injection 100 mL (100 mLs Intravenous Contrast Given 09/28/20 0953)    ED Course  I have reviewed the triage vital signs and the nursing notes.  Pertinent labs & imaging results that were available during my care of  the patient were reviewed by me and considered in my medical decision making (see chart for details).    MDM Rules/Calculators/A&P                           CHA2DS2/VAS Stroke Risk Points  Current as of 42 minutes ago     8 >= 2 Points: High Risk  1 - 1.99 Points: Medium Risk  0 Points: Low Risk    Last Change:       Details    This score determines the patient's risk of having a stroke if the  patient has atrial fibrillation.       Points Metrics  1 Has Congestive Heart Failure:  Yes    Current as of 42 minutes ago  1 Has Vascular Disease:  Yes     Current as of 42 minutes ago  1 Has Hypertension:  Yes    Current as of 42 minutes ago  2 Age:  13    Current as of 42 minutes ago  0 Has Diabetes:  No    Current as of 42 minutes ago  2 Had Stroke:  No  Had TIA:  Yes  Had Thromboembolism:  No    Current as of 42 minutes ago  1 Female:  Yes    Current as of 42 minutes ago          Pt d/w Dr. Scot Dock (vascular).  He is in the OR and will see pt as soon as he can.  He requests admission to medicine.  Pt d/w Dr. Tamala Julian (triad) who will admit.  Pt incidentally has a left ovarian mass.  The pt and her husband were notified of this.    CRITICAL CARE Performed by: Isla Pence   Total critical care time: 30 minutes  Critical care time was exclusive of separately billable procedures and treating other patients.  Critical care was necessary to treat or prevent imminent or life-threatening deterioration.  Critical care was time spent personally by me on the following activities: development of treatment plan with patient and/or surrogate as well as nursing, discussions with consultants, evaluation of patient's response to treatment, examination of patient, obtaining history from patient or surrogate, ordering and performing treatments and interventions, ordering and review of laboratory studies, ordering and review of radiographic studies, pulse oximetry and re-evaluation of  patient's condition.  Final Clinical Impression(s) / ED Diagnoses Final diagnoses:  Arterial occlusion  Atrial fibrillation, unspecified type ([place])  Ovarian mass, left    Rx / DC Orders ED Discharge Orders     None        Isla Pence, MD 09/28/20 1422

## 2020-09-28 NOTE — Progress Notes (Signed)
VASCULAR SURGERY:  This is a 87 year old who noted the fairly acute onset of right foot pain at about 4:30 this morning.  This pain persisted and she presented to the emergency department.  At this point her pain has resolved.  She does have a history of atrial fibrillation but is on Eliquis and took this yesterday.  Her CT scan does show multilevel arterial occlusive disease but also occlusion of the right common femoral artery and superficial femoral artery.  I suspect the superficial femoral artery occlusion is chronic.  However certainly it is possible, given her history, that she developed acute occlusion of the common femoral artery.  This would potentially be related to her atrial fibrillation although she is on Eliquis.  Regardless, I have offered her a right femoral thrombectomy which would be a fairly limited procedure.  I certainly would not recommend anything more extensive than that given her age, diffuse disease, and diffuse calcific disease.  Currently she is asymptomatic and is reluctant to consider surgery.  Given her age I think this is perfectly reasonable.  She is on IV heparin.  She is being admitted by the medical service.  I will talk to her later today and if she reconsiders having surgery then certainly we could attempt a limited right femoral thrombectomy in the morning.  If she remains asymptomatic and wishes not to have surgery then I think this is perfectly reasonable.  Gae Gallop, MD 3:36 PM

## 2020-09-28 NOTE — Progress Notes (Signed)
ANTICOAGULATION CONSULT NOTE - Initial Consult  Pharmacy Consult for heparin Indication:  Arterial occlusion  Allergies  Allergen Reactions   Codeine Nausea And Vomiting   Lisinopril Other (See Comments)    Unknown   Tylenol [Acetaminophen] Itching   Ibuprofen Itching and Rash   Latex Rash and Other (See Comments)    Usually breaks out the skin   Tape Rash and Other (See Comments)    Usually breaks out the skin    Patient Measurements: Height: '5\' 3"'$  (160 cm) Weight: 72.1 kg (158 lb 15.2 oz) IBW/kg (Calculated) : 52.4  Vital Signs: Temp: 97.8 F (36.6 C) (08/20 0727) BP: 145/90 (08/20 1100) Pulse Rate: 85 (08/20 1100)  Labs: Recent Labs    09/28/20 0731  HGB 12.7  HCT 41.0  PLT 225  CREATININE 0.97    Estimated Creatinine Clearance: 32.3 mL/min (by C-G formula based on SCr of 0.97 mg/dL).   Medical History: Past Medical History:  Diagnosis Date   Allergic urticaria    Anemia    Arthritis    OA AND PAIN LEFT HIP   Blood transfusion without reported diagnosis    Cataract    CHF (congestive heart failure) (HCC)    Chronic diastolic heart failure   Glaucoma    Headache(784.0)    HX OF MIGRAINES   Hypercholesterolemia    Hypertension    Myocardial infarction (HCC)    Osteoporosis    Paroxysmal atrial fibrillation (HCC)    Peripheral neuropathy    PONV (postoperative nausea and vomiting)    Rib fractures    IN THE PAST   Shingles 1940'S   Stroke ([place])    TIA (transient ischemic attack)     Medications:  (Not in a hospital admission)   Assessment: 89 YOF with R leg pain found to have occluded R common and superficial femoral arteries, distal left popliteal artery and near complete occlusion of celiac artery. Pharmacy consulted to start IV heparin. Of note, patient is on Eliquis at home and the last dose was yesterday night.    H/H and Plt wnl. SCr wnl   Goal of Therapy:  Heparin level 0.3-0.7 units/ml aPTT 66-102 seconds Monitor platelets by  anticoagulation protocol: Yes   Plan:  -Start heparin infusion at 1200 units/hr. No bolus -F/u 6 hr aPTT/HL -Monitor daily HL, CBC and s/s of bleeding   Albertina Parr, PharmD., BCPS, BCCCP Clinical Pharmacist Please refer to [facility] for unit-specific pharmacist

## 2020-09-28 NOTE — Consult Note (Addendum)
Hospital Consult  VASCULAR SURGERY ASSESSMENT & PLAN:   RIGHT LEG PAIN: This is a 87 year old who noted the fairly acute onset of right foot pain at about 4:30 this morning.  This pain persisted and she presented to the emergency department.  At this point her pain has resolved.  She does have a history of atrial fibrillation but is on Eliquis and took this yesterday.  Her CT scan does show multilevel arterial occlusive disease but also occlusion of the right common femoral artery and superficial femoral artery.  I suspect the superficial femoral artery occlusion is chronic.  However certainly it is possible, given her history, that she developed acute occlusion of the common femoral artery.  This would potentially be related to her atrial fibrillation although she is on Eliquis.  Regardless, I have offered her a right femoral thrombectomy which would be a fairly limited procedure.  I certainly would not recommend anything more extensive than that given her age, diffuse disease, and diffuse calcific disease.  Currently she is asymptomatic and is reluctant to consider surgery.  Given her age I think this is perfectly reasonable.  She is on IV heparin.  She is being admitted by the medical service.  I will talk to her later today and if she reconsiders having surgery then certainly we could attempt a limited right femoral thrombectomy in the morning.  If she remains asymptomatic and wishes not to have surgery then I think this is perfectly reasonable.  Gae Gallop, MD 4:36 PM   Reason for Consult:  Right foot pain, no doppler flow in right foot Requesting Physician:  Isla Pence, MD MRN #:  [id number]  History of Present Illness: This is a 87 y.o. female with pertinent past medical history of CHF, MI, PAF on Eliquis, CVA, HTN, and HLD who presented to the ED with acute onset of right foot pain this morning. Her husband helps to provide history as patient is hard of hearing but he explains that she  was getting ready to get out of bed to take a shower when she hollered out in pain from her bed.  She reports that the pain began on the bottom of her right foot suddenly when she woke up around 4:30 am and traveled up her right leg. Their daughter came to find her in bed in pain and noticed that the foot was white. She does not usually have any discomfort in her legs on ambulation. She says she gets around fairly well using a cane. She denies any claudication symptoms, or rest pain and she has no tissue loss. She does have swelling in her legs but this is improved with fluid pills. She takes Eliquis for her atrial fibrillation and she reports taking her doses daily. However, in speaking with daughter and son on the phone she occasionally will miss doses. They do not feel that she missed any in last several days. She otherwise is usually very independent in her ADLs.   On presentation to the ED she was found to not have any doppler signals in her legs and CT Angio is significant for 60% stenosis of origin of the common iliac artery, occluded right common and superficial femoral arteries, occluded distal left popliteal artery and tibioperoneal trunk with likely single vessel runoff via the peroneal artery. She additionally has significant atherosclerosis throughout her abdominal vessels.She has been started on Heparin IV. Vascular surgery was consulted to evaluate her for critical limb ischemia.  Past Medical History:  Diagnosis Date  Allergic urticaria    Anemia    Arthritis    OA AND PAIN LEFT HIP   Blood transfusion without reported diagnosis    Cataract    CHF (congestive heart failure) (HCC)    Chronic diastolic heart failure   Glaucoma    Headache(784.0)    HX OF MIGRAINES   Hypercholesterolemia    Hypertension    Myocardial infarction ([place])    Osteoporosis    Paroxysmal atrial fibrillation (HCC)    Peripheral neuropathy    PONV (postoperative nausea and vomiting)    Rib fractures     IN THE PAST   Shingles 1940'S   Stroke ([place])    TIA (transient ischemic attack)     Past Surgical History:  Procedure Laterality Date   [place] OF UTERUS     MULTIPLE   EXTERNAL FIXATOR LEFT [place] & 2006   BILATERAL CATARACT EXTRACTION   JOINT REPLACEMENT  2011   RIGHT TOTAL HIP REPLACEMENT   TONSILLECTOMY  1934   TOTAL HIP ARTHROPLASTY  05/27/2011   Procedure: TOTAL HIP ARTHROPLASTY;  Surgeon: Gearlean Alf, MD;  Location: [facility];  Service: Orthopedics;  Laterality: Left;    Allergies  Allergen Reactions   Codeine Nausea And Vomiting   Lisinopril Other (See Comments)    Unknown   Tylenol [Acetaminophen] Itching   Ibuprofen Itching and Rash   Latex Rash and Other (See Comments)    Usually breaks out the skin   Tape Rash and Other (See Comments)    Usually breaks out the skin    Prior to Admission medications   Medication Sig Start Date End Date Taking? Authorizing Provider  amLODipine (NORVASC) 5 MG tablet Take 1 tablet by mouth once daily Patient taking differently: Take 5 mg by mouth daily. 08/19/20  Yes Lorretta Harp, MD  Cyanocobalamin (VITAMIN B-12 PO) Take 1 tablet by mouth daily with breakfast.    Yes [provider]  ELIQUIS 5 MG TABS tablet Take 1 tablet by mouth twice daily Patient taking differently: Take 5 mg by mouth 2 (two) times daily. 05/30/20  Yes Lorretta Harp, MD  ezetimibe (ZETIA) 10 MG tablet Take 10 mg by mouth daily. 04/29/19  Yes [provider]  ferrous sulfate 325 (65 FE) MG tablet Take 1 tablet (325 mg total) by mouth 2 (two) times daily after a meal. 07/11/18  Yes Ladene Artist, MD  furosemide (LASIX) 80 MG tablet TAKE 1 TABLET BY MOUTH ONCE DAILY(SCHEDULE APPT FOR FUTURE REFILLS) Patient taking differently: Take 80 mg by mouth daily. 08/19/20  Yes Lorretta Harp, MD  hydrocortisone cream 1 % Apply 1 application  topically 2 (two) times daily as needed (for itching- apply to affected areas).    Yes [provider]  Multiple Vitamins-Minerals (PRESERVISION AREDS PO) Take 1 tablet by mouth 2 (two) times daily.   Yes [provider]  COVID-19 mRNA vaccine, Pfizer, 30 MCG/0.3ML injection USE AS DIRECTED 12/27/19 12/26/20  Carlyle Basques, MD    Social History   Socioeconomic History   Marital status: Married    Spouse name: Not on file   Number of children: 1   Years of education: Not on file   Highest education level: Some college, no degree  Occupational History   Occupation: retired  Tobacco Use   Smoking status: Former   Smokeless  tobacco: Never   Tobacco comments:    60 YRS AGO  Vaping Use   Vaping Use: Never used  Substance and Sexual Activity   Alcohol use: No   Drug use: No   Sexual activity: Not on file  Other Topics Concern   Not on file  Social History Narrative   Lives with husband in a one story home.  They are married for 59 years.  Has 1 son.  Retired from working in Insurance underwriter.  Education: some college.   Social Determinants of Health   Financial Resource Strain: Not on file  Food Insecurity: Not on file  Transportation Needs: Not on file  Physical Activity: Not on file  Stress: Not on file  Social Connections: Not on file  Intimate Partner Violence: Not on file   Family History  Problem Relation Age of Onset   Heart disease Mother    Stroke Mother    Other Father 49       MVA   Stroke Sister    Stroke Brother    Atrial fibrillation Son    Other Son 12       fire   Colon cancer Neg Hx    Esophageal cancer Neg Hx    Rectal cancer Neg Hx    Stomach cancer Neg Hx     ROS: Otherwise negative unless mentioned in HPI  Physical Examination  Vitals:   09/28/20 1430 09/28/20 1500  BP: (!) 129/109   Pulse: 86 88  Resp: (!) 21 20  Temp:    SpO2: 99% 97%   Body mass index is 28.16 kg/m.  General:  WDWN in NAD, pleasant elderly caucasian  female. Hard of hearing Gait: Not observed HENT: WNL, normocephalic Pulmonary: normal non-labored breathing, without wheezing Cardiac: irregularly irregular Abdomen: obese, soft, NT/ND, no masses Vascular Exam/Pulses: left femoral pulse 2+, right femoral pulse 1 +, no palpable distal pulses. She is without ischemic changes, without Gangrene , without cellulitis; without open wounds; both feet are cool to the touch. Motor and sensation intact. No doppler signals bilaterally Musculoskeletal: no muscle wasting or atrophy  Neurologic: A&O X 3;  No focal weakness or paresthesias are detected; speech is fluent/normal Psychiatric:  The pt has Normal affect.   CBC    Component Value Date/Time   WBC 8.9 09/28/2020 0731   RBC 3.98 09/28/2020 0731   HGB 12.7 09/28/2020 0731   HCT 41.0 09/28/2020 0731   HCT 36.6 11/07/2016 0615   PLT 225 09/28/2020 0731   MCV 103.0 (H) 09/28/2020 0731   MCH 31.9 09/28/2020 0731   MCHC 31.0 09/28/2020 0731   RDW 15.0 09/28/2020 0731   LYMPHSABS 0.8 09/28/2020 0731   MONOABS 0.5 09/28/2020 0731   EOSABS 0.2 09/28/2020 0731   BASOSABS 0.0 09/28/2020 0731    BMET    Component Value Date/Time   NA 140 09/28/2020 0731   NA 143 08/09/2017 1013   K 3.5 09/28/2020 0731   CL 104 09/28/2020 0731   CO2 25 09/28/2020 0731   GLUCOSE 169 (H) 09/28/2020 0731   BUN 12 09/28/2020 0731   BUN 22 08/09/2017 1013   CREATININE 0.97 09/28/2020 0731   CALCIUM 8.5 (L) 09/28/2020 0731   GFRNONAA 53 (L) 09/28/2020 0731   GFRAA 52 (L) 08/09/2017 1013    COAGS: Lab Results  Component Value Date   INR 1.7 (H) 08/16/2020   INR 1.03 11/25/2016   INR 1.02 11/23/2016     Non-Invasive Vascular Imaging:   IMPRESSION:  VASCULAR   1. 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 Flow reconstitutes within the popliteal artery. Single-vessel runoff to the right ankle through the peroneal artery. 2. Occluded distal left popliteal artery and tibioperoneal  trunk. Two vessel runoff to the left ankle through posterior tibial and peroneal arteries. 3. 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 70% stenosis at the origin of the superior mesenteric artery. High-grade stenosis of the origin of the right renal artery.   NON-VASCULAR   1. 10.2 x 4.8 x 5.9 cm left adnexal mass most likely of ovarian origin. This highly suspicious for malignancy. Gynecological consultation is recommended.    Statin:  No. Beta Blocker:  No. Aspirin:  No. ACEI:  No. ARB:  No. CCB use:  Yes Other antiplatelets/anticoagulants:  Yes.   Eliquis   ASSESSMENT/PLAN: This is a 87 y.o. female with atherosclerosis of bilateral lower extremities with acute onset of right foot pain. She has extensive arterial occlusions of bilateral lower extremities on CT. She does not have any doppler signals in either feet. She is motor and sensation intact. Most of her disease is likely chronic however it is possible she is not compliant with her Eliquis and that she embolized to her right lower extremity causing her to have acute pain in her leg. Her acute pain is now resolved and no signs of acute ischemia in the right or left lower extremity. She is being admitted to the hospital on the Hospitalist service and would recommend continuing IV heparin. Surgical intervention vs medical management and observation was discussed with patient and her Husband. Patient was seen with Dr. Scot Dock who will provide further management plans.   Karoline Caldwell PA-C [facility] [phone] 09/28/2020  3:35 PM

## 2020-09-28 NOTE — ED Notes (Signed)
Both hospitalist and vascular are at bedside

## 2020-09-29 DIAGNOSIS — I4891 Unspecified atrial fibrillation: Secondary | ICD-10-CM

## 2020-09-29 DIAGNOSIS — I480 Paroxysmal atrial fibrillation: Secondary | ICD-10-CM

## 2020-09-29 DIAGNOSIS — R1900 Intra-abdominal and pelvic swelling, mass and lump, unspecified site: Secondary | ICD-10-CM

## 2020-09-29 DIAGNOSIS — U071 COVID-19: Secondary | ICD-10-CM

## 2020-09-29 DIAGNOSIS — J1282 Pneumonia due to coronavirus disease 2019: Secondary | ICD-10-CM

## 2020-09-29 DIAGNOSIS — I739 Peripheral vascular disease, unspecified: Secondary | ICD-10-CM

## 2020-09-29 DIAGNOSIS — Z7901 Long term (current) use of anticoagulants: Secondary | ICD-10-CM

## 2020-09-29 LAB — BASIC METABOLIC PANEL
Anion gap: 9 (ref 5–15)
BUN: 15 mg/dL (ref 8–23)
CO2: 23 mmol/L (ref 22–32)
Calcium: 8.5 mg/dL — ABNORMAL LOW (ref 8.9–10.3)
Chloride: 106 mmol/L (ref 98–111)
Creatinine, Ser: 0.97 mg/dL (ref 0.44–1.00)
GFR, Estimated: 53 mL/min — ABNORMAL LOW (ref >60)
Glucose, Bld: 137 mg/dL — ABNORMAL HIGH (ref 70–99)
Potassium: 4.9 mmol/L (ref 3.5–5.1)
Sodium: 138 mmol/L (ref 135–145)

## 2020-09-29 LAB — CA 125: Cancer Antigen (CA) 125: 11.6 U/mL (ref 0.0–38.1)

## 2020-09-29 LAB — CEA: CEA: 3.2 ng/mL (ref 0.0–4.7)

## 2020-09-29 LAB — CBC
HCT: 39.9 % (ref 36.0–46.0)
Hemoglobin: 13 g/dL (ref 12.0–15.0)
MCH: 32.5 pg (ref 26.0–34.0)
MCHC: 32.6 g/dL (ref 30.0–36.0)
MCV: 99.8 fL (ref 80.0–100.0)
Platelets: 199 10*3/uL (ref 150–400)
RBC: 4 MIL/uL (ref 3.87–5.11)
RDW: 15.3 % (ref 11.5–15.5)
WBC: 7.7 10*3/uL (ref 4.0–10.5)
nRBC: 0 % (ref 0.0–0.2)

## 2020-09-29 LAB — HEPARIN LEVEL (UNFRACTIONATED): Heparin Unfractionated: 0.34 IU/mL (ref 0.30–0.70)

## 2020-09-29 MED ORDER — SODIUM CHLORIDE 0.9 % IV SOLN
200.0000 mg | Freq: Once | INTRAVENOUS | Status: AC
  Administered 2020-09-29: 200 mg via INTRAVENOUS
  Filled 2020-09-29: qty 40

## 2020-09-29 MED ORDER — FAMOTIDINE 20 MG PO TABS
20.0000 mg | ORAL_TABLET | Freq: Every day | ORAL | Status: DC
  Administered 2020-09-29 – 2020-10-04 (×6): 20 mg via ORAL
  Filled 2020-09-29 (×6): qty 1

## 2020-09-29 MED ORDER — APIXABAN 5 MG PO TABS
5.0000 mg | ORAL_TABLET | Freq: Two times a day (BID) | ORAL | Status: DC
  Administered 2020-09-29 – 2020-10-04 (×11): 5 mg via ORAL
  Filled 2020-09-29 (×11): qty 1

## 2020-09-29 MED ORDER — SODIUM CHLORIDE 0.9 % IV SOLN
100.0000 mg | Freq: Every day | INTRAVENOUS | Status: AC
  Administered 2020-09-30 – 2020-10-03 (×4): 100 mg via INTRAVENOUS
  Filled 2020-09-29 (×2): qty 100
  Filled 2020-09-29: qty 20
  Filled 2020-09-29: qty 100

## 2020-09-29 MED ORDER — HALOPERIDOL LACTATE 5 MG/ML IJ SOLN
0.5000 mg | Freq: Four times a day (QID) | INTRAMUSCULAR | Status: DC | PRN
  Administered 2020-09-29 – 2020-10-03 (×6): 0.5 mg via INTRAVENOUS
  Filled 2020-09-29 (×6): qty 1

## 2020-09-29 NOTE — Progress Notes (Addendum)
[facility] PROGRESS NOTE   Maria [name] [id number] DOB: [birth year] DOA: 09/28/2020  PCP: Jani Gravel, MD  Brief History/Interval Summary: 87 y.o. female with medical history significant of hypertension, hyperlipidemia, paroxysmal atrial fibrillation, anemia, and diastolic CHF last EF 60 to 65% presents with complaints of right leg pain.  Concern was for vascular ischemia.  Seen by vascular surgery.  CT angiogram was done.  Patient was started on heparin infusion.  Hospitalize for further management.  Subsequently found to be positive for COVID-19.  Has been experiencing dry cough for the past week or so.    Consultants: Vascular surgery  Procedures: None  Antibiotics: Anti-infectives (From admission, onward)    Start     Dose/Rate Route Frequency Ordered Stop   09/30/20 1000  remdesivir 100 mg in sodium chloride 0.9 % 100 mL IVPB       See Hyperspace for full Linked Orders Report.   100 mg 200 mL/hr over 30 Minutes Intravenous Daily 09/29/20 1050 10/04/20 0959   09/29/20 1200  remdesivir 200 mg in sodium chloride 0.9% 250 mL IVPB       See Hyperspace for full Linked Orders Report.   200 mg 580 mL/hr over 30 Minutes Intravenous Once 09/29/20 1050         Subjective/Interval History: Patient denies any pain in her right lower extremity.  Husband is at the bedside.  CODE STATUS was discussed with patient along with her husband.  She will be changed over to DNR based on this conversation.  Patient also mentions a dry cough ongoing for about a week or so.  No real shortness of breath or chest pain.     Assessment/Plan:  Acute right foot pain with concern for acute ischemia Patient underwent CT angiogram which showed multiple occlusions.  Patient was taking Eliquis prior to admission for her atrial fibrillation.  She had been compliant with this medication.  Vascular surgery was consulted.  Patient was started on heparin.  Discussions were held with patient  regarding any thrombectomy.  Patient was initially reluctant.  Seen by vascular surgery again this morning and they have found some pulses in the right lower extremity even with the Doppler.  They do not feel the patient needs surgery at this time.  Discussed with Dr. Scot Dock with vascular surgery.  He recommends placing the patient back on her Eliquis.  Heparin will be discontinued.  Appears to be pain-free currently.  Pneumonia due to COVID-19 Chest x-ray does raise concern for opacities.  Patient with a dry cough for the past week or so.  She mentions that she has been immunized against COVID-19.  She is on Eliquis so cannot be given Paxlovid.  Will initiate Remdesivir.  Saturation was noted to be 95% on room air.  We will hold off on steroids.  We will check inflammatory markers.  Left adnexal mass Concern was for malignancy.  MRI of the pelvis was done which raises concern for fibroid and fibrothecoma.  These appear to be benign lesions.  Will need for outpatient follow-up with GYN.  Lactic acidosis Likely due to ischemia.  History of paroxysmal atrial fibrillation on chronic anticoagulation Eliquis to be resumed today.  Not noted to be on any rate control medications.  Chronic diastolic CHF Continue furosemide.  Essential hypertension Continue amlodipine.  Dyslipidemia Continue Zetia  DVT Prophylaxis: On Eliquis Code Status: Changed to DNR based on conversation with patient and husband Family Communication: Discussed with patient's husband.  We will call granddaughter  as well Disposition Plan: Will need to be seen by PT and OT.  Hopefully return home when improved.  Status is: Inpatient  Remains inpatient appropriate because:IV treatments appropriate due to intensity of illness or inability to take PO and Inpatient level of care appropriate due to severity of illness  Dispo: The patient is from: Home              Anticipated d/c is to: Home              Patient currently is  not medically stable to d/c.   Difficult to place patient No       Medications: Scheduled:  amLODipine  5 mg Oral Daily   apixaban  5 mg Oral BID   ezetimibe  10 mg Oral Daily   furosemide  80 mg Oral Daily   sodium chloride flush  3 mL Intravenous Q12H   Continuous:  remdesivir 200 mg in sodium chloride 0.9% 250 mL IVPB     Followed by   Derrill Memo ON 09/30/2020] remdesivir 100 mg in NS 100 mL     [id number], fentaNYL (SUBLIMAZE) injection, hydrocortisone cream, ondansetron **OR** ondansetron (ZOFRAN) IV   Objective:  Vital Signs  Vitals:   09/28/20 2336 09/29/20 0344 09/29/20 0732 09/29/20 1120  BP: 124/78 138/84 136/76 118/77  Pulse: 85 86 (!) 106 100  Resp: '18 15 20 20  '$ Temp: 98.1 F (36.7 C) 98.7 F (37.1 C) 97.8 F (36.6 C) 98.4 F (36.9 C)  TempSrc: Oral Oral Axillary Oral  SpO2: 91%  92% 97%  Weight:      Height:        Intake/Output Summary (Last 24 hours) at 09/29/2020 1212 Last data filed at 09/28/2020 1952 Gross per 24 hour  Intake 64.67 ml  Output --  Net 64.67 ml   Filed Weights   09/28/20 0728  Weight: 72.1 kg    General appearance: Somnolent but easily arousable.  In no distress. Resp: Clear to auscultation bilaterally.  Normal effort Cardio: S1-S2 is normal regular.  No S3-S4.  No rubs murmurs or bruit GI: Abdomen is soft.  Nontender nondistended.  Bowel sounds are present normal.  No masses organomegaly Extremities: No edema.  Moving all of her extremities. Neurologic: No focal neurological deficits.    Lab Results:  Data Reviewed: I have personally reviewed following labs and imaging studies  CBC: Recent Labs  Lab 09/28/20 0731 09/29/20 0155  WBC 8.9 7.7  NEUTROABS 7.3  --   HGB 12.7 13.0  HCT 41.0 39.9  MCV 103.0* 99.8  PLT 225 [id number]    Basic Metabolic Panel: Recent Labs  Lab 09/28/20 0731 09/29/20 0155  NA 140 138  K 3.5 4.9  CL 104 106  CO2 25 23  GLUCOSE 169* 137*  BUN 12 15  CREATININE 0.97 0.97  CALCIUM  8.5* 8.5*    GFR: Estimated Creatinine Clearance: 32.3 mL/min (by C-G formula based on SCr of 0.97 mg/dL).  Liver Function Tests: Recent Labs  Lab 09/28/20 0731  AST 33  ALT 21  ALKPHOS 96  BILITOT 0.6  PROT 6.2*  ALBUMIN 3.5     Recent Results (from the past 240 hour(s))  Resp Panel by RT-PCR (Flu A&B, Covid) Nasopharyngeal Swab     Status: Abnormal   Collection Time: 09/28/20  2:23 PM   Specimen: Nasopharyngeal Swab; Nasopharyngeal(NP) swabs in vial transport medium  Result Value Ref Range Status   SARS Coronavirus 2 by RT PCR POSITIVE (A) NEGATIVE Final  Comment: RESULT CALLED TO, READ BACK BY AND VERIFIED WITH: RN CHRISTINA.C AT [id number] ON 09/28/2020 BY PAW. (NOTE) SARS-CoV-2 target nucleic acids are DETECTED.  The SARS-CoV-2 RNA is generally detectable in upper respiratory specimens during the acute phase of infection. Positive results are indicative of the presence of the identified virus, but do not rule out bacterial infection or co-infection with other pathogens not detected by the test. Clinical correlation with patient history and other diagnostic information is necessary to determine patient infection status. The expected result is Negative.  Fact Sheet for Patients: EntrepreneurPulse.com.au  Fact Sheet for Healthcare Providers: IncredibleEmployment.be  This test is not yet approved or cleared by the [place] FDA and  has been authorized for detection and/or diagnosis of SARS-CoV-2 by FDA under an Emergency Use Authorization (EUA).  This EUA will remain in effect (meaning this  test can be used) for the duration of  the COVID-19 declaration under Section 564(b)(1) of the Act, 21 U.S.C. section 360bbb-3(b)(1), unless the authorization is terminated or revoked sooner.     Influenza A by PCR NEGATIVE NEGATIVE Final   Influenza B by PCR NEGATIVE NEGATIVE Final    Comment: (NOTE) The Xpert Xpress SARS-CoV-2/FLU/RSV  plus assay is intended as an aid in the diagnosis of influenza from Nasopharyngeal swab specimens and should not be used as a sole basis for treatment. Nasal washings and aspirates are unacceptable for Xpert Xpress SARS-CoV-2/FLU/RSV testing.  Fact Sheet for Patients: EntrepreneurPulse.com.au  Fact Sheet for Healthcare Providers: IncredibleEmployment.be  This test is not yet approved or cleared by the [place] FDA and has been authorized for detection and/or diagnosis of SARS-CoV-2 by FDA under an Emergency Use Authorization (EUA). This EUA will remain in effect (meaning this test can be used) for the duration of the COVID-19 declaration under Section 564(b)(1) of the Act, 21 U.S.C. section 360bbb-3(b)(1), unless the authorization is terminated or revoked.  Performed at [facility], [place] [address] [place], [place] [address]       Radiology Studies: MR PELVIS W WO CONTRAST  Result Date: 09/28/2020 CLINICAL DATA:  Left adnexal mass on recent CT. EXAM: MRI PELVIS WITHOUT AND WITH CONTRAST TECHNIQUE: Multiplanar multisequence MR imaging of the pelvis was performed both before and after administration of intravenous contrast. CONTRAST:  68m GADAVIST GADOBUTROL 1 MMOL/ML IV SOLN COMPARISON:  CTA on 09/29/2018 FINDINGS: Image degradation by motion artifact noted. Lower Urinary Tract: Unremarkable appearance of urinary bladder. Bowel: Severe diverticulosis of sigmoid colon is seen, however there is no evidence of diverticulitis. Vascular/Lymphatic: Unremarkable. No pathologically enlarged pelvic lymph nodes identified. Reproductive: -- Uterus: Surgically absent. Vaginal cuff is unremarkable in appearance. -- Right ovary: Not visualized, however no adnexal mass identified. -- Left ovary: A bilobed solid mass is seen in the left adnexa which measures 9.6 x 5.1 by 6.3 cm. This shows homogeneous T2 hypointensity, and differential diagnosis includes  ovarian fibrothecoma and broad ligament fibroid. Other: Small amount of free fluid is seen in the pelvic cul-de-sac. Musculoskeletal:  Unremarkable. IMPRESSION: 9.6 cm solid mass in the left adnexa, with differential diagnosis of ovarian fibrothecoma and broad ligament fibroid. Small amount of free fluid in pelvic cul-de-sac. Prior hysterectomy. Severe diverticulosis of sigmoid colon, without evidence of diverticulitis. Electronically Signed   By: JMarlaine HindM.D.   On: 09/28/2020 18:05   CT Angio Aortobifemoral W and/or Wo Contrast  Result Date: 09/28/2020 CLINICAL DATA:  New onset right foot pain traveling up to right knee which began this morning. Unable  to palpate pedal pulses. EXAM: CT ANGIOGRAPHY OF ABDOMINAL AORTA WITH ILIOFEMORAL RUNOFF TECHNIQUE: Multidetector CT imaging of the abdomen, pelvis and lower extremities was performed using the standard protocol during bolus administration of intravenous contrast. Multiplanar CT image reconstructions and MIPs were obtained to evaluate the vascular anatomy. CONTRAST:  169m OMNIPAQUE IOHEXOL 350 MG/ML SOLN COMPARISON:  None. FINDINGS: VASCULAR Aorta: Diffuse calcified atheromatous plaque without aneurysm or flow-limiting stenosis. 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 The left gastric, common hepatic and splenic arteries are patent. SMA: 70% stenosis at the origin of the superior mesenteric artery predominately due to noncalcified atheromatous plaque. The artery is patent beyond this region focal stenosis. Renals: Severe stenosis at the origin of the right main renal artery. Mild narrowing at the origin of the left main renal artery. IMA: Severe stenosis of the origin of the inferior mesenteric artery which is otherwise patent. RIGHT Lower Extremity Inflow: Approximately 60% stenosis at the origin of the common iliac artery due to calcified plaque. Common iliac artery otherwise  diffusely calcified but patent. No significant narrowing of the internal iliac artery. Focal stenosis of the mid external iliac artery measuring approximately 50% in its mid segment. Outflow: Evaluation the common femoral artery is limited due to streak artifact from a total hip prosthesis. The common femoral artery appears occluded. There is flow within the Profunda femoris artery likely due to collateral flow. The superficial femoral artery is occluded. There is reconstitution of flow within the popliteal artery on the delayed phase imaging. There is focal stenosis of the distal popliteal artery measuring approximately 70%. Runoff: Tibioperoneal trunk is patent. There is discontinuous opacification of the posterior tibial artery. No significant ossification of the anterior tibial artery. The peroneal artery is patent to the ankle. LEFT Lower Extremity Inflow: Approximately 50% stenosis at the origin of the common iliac artery due to calcified plaque. Common iliac artery is diffusely calcified but otherwise patent. Moderate to severe stenosis at the origin of the internal iliac artery which is otherwise patent. No significant stenosis of the external iliac artery. Outflow: No significant stenosis of the common femoral or Profunda femoris arteries. Focal stenosis of the proximal superficial femoral artery measuring up to 40%. Multiple additional sites of mild stenosis seen in the distal segment of the SFA measuring up to 30%. Left popliteal artery is patent in its proximal and mid segment but occludes in its distal segment. Runoff: The tibioperoneal trunk is occluded. There is faint reconstitution of flow in the anterior tibial artery which is occluded from mid to distal lower leg. Flow reconstitutes within the proximal posterior tibial and peroneal arteries which are patent to the level of the ankle. Veins: No obvious venous abnormality within the limitations of this arterial phase study. Review of the MIP images  confirms the above findings. NON-VASCULAR Lower chest: Mild cardiomegaly.  Mild bibasilar atelectasis. Hepatobiliary: No focal liver abnormality is seen. No gallstones, gallbladder wall thickening, or biliary dilatation. Pancreas: Diffuse fatty atrophy of the pancreas. Spleen: Normal in size without focal abnormality. Adrenals/Urinary Tract: Right adrenal gland is not well visualized. No significant abnormality of the left adrenal gland. Hyperdensity within the renal collecting systems most likely due to early excretion of contrast rather than medullary nephrocalcinosis. Bladder evaluation limited due to streak artifact from bilateral total hip prostheses. Stomach/Bowel: Diffuse colonic diverticulosis, most extensive in the sigmoid. No evidence of acute diverticulitis. Appendix is not definitively visualized. Large duodenal diverticulum is  noted. No small bowel dilatation. Lymphatic: No enlarged abdominal or pelvic lymph nodes. Reproductive: A lobulated mass is noted in the left adnexa measuring 10.2 x 4.8 x 5.9 cm. The exact origin of this mass is difficult to determine, however it is most likely arising from the left ovary. Other: No abdominal wall hernia or abnormality. No abdominopelvic ascites. Musculoskeletal: Bilateral total hip prostheses are present. Degenerative changes are seen throughout the lumbar spine. IMPRESSION: VASCULAR 1. 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 Flow reconstitutes within the popliteal artery. Single-vessel runoff to the right ankle through the peroneal artery. 2. Occluded distal left popliteal artery and tibioperoneal trunk. Two vessel runoff to the left ankle through posterior tibial and peroneal arteries. 3. 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 70% stenosis at the origin of the superior mesenteric artery. High-grade stenosis of the origin of the right renal artery. NON-VASCULAR 1. 10.2 x 4.8 x 5.9 cm left adnexal mass most likely  of ovarian origin. This highly suspicious for malignancy. Gynecological consultation is recommended. Electronically Signed   By: Miachel Roux M.D.   On: 09/28/2020 11:01   DG CHEST PORT 1 VIEW  Result Date: 09/28/2020 CLINICAL DATA:  Arterial occlusion, lower extremity EXAM: PORTABLE CHEST 1 VIEW COMPARISON:  Radiograph 08/15/2020, chest CT 03/09/2017 FINDINGS: Unchanged cardiomediastinal silhouette with aortic arch calcifications. The heart is mildly enlarged, unchanged. There are mild diffuse interstitial opacities. Old right rib injuries noted. Bilateral shoulder degenerative changes. IMPRESSION: Mild interstitial pulmonary edema.  Unchanged cardiomegaly. Electronically Signed   By: Maurine Simmering M.D.   On: 09/28/2020 16:00       LOS: 1 day   Josian Lanese [employer] Pager on [email or web address]  09/29/2020, 12:12 PM

## 2020-09-29 NOTE — Progress Notes (Signed)
   VASCULAR SURGERY ASSESSMENT & PLAN:   PERIPHERAL VASCULAR DISEASE: This patient had presented with pain in the right foot that was fairly sudden onset yesterday morning.  This pain resolved yesterday.  She has a history of atrial fibrillation but has been taking her Eliquis.  She is COVID-positive.  Based on her CT angiogram she has evidence of multilevel arterial occlusive disease.  I think most likely she had underlying disease in the common femoral artery with a chronic SFA occlusion and thrombosed this artery yesterday.  However she has extensive collaterals.  This morning I am able to obtain a right dorsalis pedis anterior tibial and peroneal signal on the right.  On the left side she has a posterior tibial and peroneal signal.  After extensive discussion with the patient and her husband this morning we have agreed not to proceed with surgery.  She is currently on IV heparin.  She can restart her Eliquis.   SUBJECTIVE:   No foot pain this morning.  PHYSICAL EXAM:   Vitals:   09/28/20 1817 09/28/20 1900 09/28/20 2336 09/29/20 0344  BP: (!) 129/92 (!) 118/93 124/78 138/84  Pulse: 90 80 85 86  Resp: '20  18 15  '$ Temp: (!) 96.2 F (35.7 C) 98.2 F (36.8 C) 98.1 F (36.7 C) 98.7 F (37.1 C)  TempSrc: Oral Oral Oral Oral  SpO2: 96%  91%   Weight:      Height:       Feet appear adequately perfused with good color. On the right there is a dampened monophasic dorsalis pedis anterior tibial and peroneal signal. On the left there is a monophasic posterior tibial signal and a dampened monophasic peroneal signal.  LABS:   Lab Results  Component Value Date   WBC 7.7 09/29/2020   HGB 13.0 09/29/2020   HCT 39.9 09/29/2020   MCV 99.8 09/29/2020   PLT 199 09/29/2020   Lab Results  Component Value Date   CREATININE 0.97 09/29/2020   Lab Results  Component Value Date   INR 1.7 (H) 08/16/2020    PROBLEM LIST:    Principal Problem:   Arterial occlusion, lower extremity  (HCC) Active Problems:   Dyslipidemia   Essential hypertension   Paroxysmal atrial fibrillation (HCC)   Chronic diastolic CHF (congestive heart failure) (HCC)   Adnexal mass   CURRENT MEDS:    amLODipine  5 mg Oral Daily   ezetimibe  10 mg Oral Daily   furosemide  80 mg Oral Daily   sodium chloride flush  3 mL Intravenous Q12H    Deitra Mayo Office: [phone] 09/29/2020

## 2020-09-29 NOTE — Progress Notes (Signed)
[facility] for heparin Indication:  Arterial occlusion  Allergies  Allergen Reactions   Codeine Nausea And Vomiting   Lisinopril Other (See Comments)    Unknown   Tylenol [Acetaminophen] Itching   Ibuprofen Itching and Rash   Latex Rash and Other (See Comments)    Usually breaks out the skin   Tape Rash and Other (See Comments)    Usually breaks out the skin    Patient Measurements: Height: '5\' 3"'$  (160 cm) Weight: 72.1 kg (158 lb 15.2 oz) IBW/kg (Calculated) : 52.4  Vital Signs: Temp: 98.1 F (36.7 C) (08/20 2336) Temp Source: Oral (08/20 2336) BP: 124/78 (08/20 2336) Pulse Rate: 85 (08/20 2336)  Labs: Recent Labs    09/28/20 0731 09/28/20 2040  HGB 12.7  --   HCT 41.0  --   PLT 225  --   APTT  --  73*  HEPARINUNFRC  --  0.24*  CREATININE 0.97  --      Estimated Creatinine Clearance: 32.3 mL/min (by C-G formula based on SCr of 0.97 mg/dL).   Medical History: Past Medical History:  Diagnosis Date   Allergic urticaria    Anemia    Arthritis    OA AND PAIN LEFT HIP   Blood transfusion without reported diagnosis    Cataract    CHF (congestive heart failure) (HCC)    Chronic diastolic heart failure   Glaucoma    Headache(784.0)    HX OF MIGRAINES   Hypercholesterolemia    Hypertension    Myocardial infarction (HCC)    Osteoporosis    Paroxysmal atrial fibrillation (HCC)    Peripheral neuropathy    PONV (postoperative nausea and vomiting)    Rib fractures    IN THE PAST   Shingles 1940'S   Stroke ([place])    TIA (transient ischemic attack)      Assessment: 89 YOF with R leg pain found to have occluded R common and superficial femoral arteries, distal left popliteal artery and near complete occlusion of celiac artery. Pharmacy consulted to start IV heparin. Of note, patient is on Eliquis at home and the last dose was yesterday night.    Heparin level 0.24 (subtherapeutic) - appears that Eliquis effect has worn  off. aPTT 73 sec. No issues with line or bleeding reported per RN.  Goal of Therapy:  Heparin level 0.3-0.7 units/ml aPTT 66-102 seconds Monitor platelets by anticoagulation protocol: Yes   Plan:  Increase heparin infusion to 1350 units/hr F/u heparin level in 8 hours  Sherlon Handing, PharmD, BCPS Please see amion for complete clinical pharmacist phone list 09/29/2020 12:37 AM

## 2020-09-29 NOTE — Progress Notes (Signed)
Patients son Jenny Reichmann requested to speak to MD about DNR order. MD notified.  Daymon Larsen, RN

## 2020-09-29 NOTE — Consult Note (Signed)
Reason for Consult: Pelvic Mass Referring Physician: Deitra Mayo, MD  Maria [name] is an 87 y.o. female.  HPI: Asked to consult on this patient with a newly diagnosed pelvic mass.  The history was gleaned from the patient's spouse as she was agitated and disoriented to place.  She presented w/RLE pain and was found to have arterial occlusions/thrombi. Incidentally a solid, left-sided adnexal mass was noted.  No other findings suggestive a metastatic disease.  There is a remote h/o hysterectomy.  Per the patient's spouse there has been no change in her weight, bowel or bladder habits.    Past Medical History:  Diagnosis Date   Allergic urticaria    Anemia    Arthritis    OA AND PAIN LEFT HIP   Blood transfusion without reported diagnosis    Cataract    CHF (congestive heart failure) (HCC)    Chronic diastolic heart failure   Glaucoma    Headache(784.0)    HX OF MIGRAINES   Hypercholesterolemia    Hypertension    Myocardial infarction ([facility])    Osteoporosis    Paroxysmal atrial fibrillation (HCC)    Peripheral neuropathy    PONV (postoperative nausea and vomiting)    Rib fractures    IN THE PAST   Shingles 1940'S   Stroke ([place])    TIA (transient ischemic attack)     Past Surgical History:  Procedure Laterality Date   [place] OF UTERUS     MULTIPLE   EXTERNAL FIXATOR LEFT [facility] & 2006   BILATERAL CATARACT EXTRACTION   JOINT REPLACEMENT  2011   RIGHT TOTAL HIP REPLACEMENT   TONSILLECTOMY  1934   TOTAL HIP ARTHROPLASTY  05/27/2011   Procedure: TOTAL HIP ARTHROPLASTY;  Surgeon: Gearlean Alf, MD;  Location: [facility];  Service: Orthopedics;  Laterality: Left;    Family History  Problem Relation Age of Onset   Heart disease Mother    Stroke Mother    Other Father 58       MVA   Stroke Sister    Stroke Brother    Atrial fibrillation Son    Other Son 12        fire   Colon cancer Neg Hx    Esophageal cancer Neg Hx    Rectal cancer Neg Hx    Stomach cancer Neg Hx     Social History:  reports that she has quit smoking. She has never used smokeless tobacco. She reports that she does not drink alcohol and does not use drugs.  Allergies:  Allergies  Allergen Reactions   Codeine Nausea And Vomiting   Lisinopril Other (See Comments)    Unknown   Tylenol [Acetaminophen] Itching   Ibuprofen Itching and Rash   Latex Rash and Other (See Comments)    Usually breaks out the skin   Tape Rash and Other (See Comments)    Usually breaks out the skin    Medications: I have reviewed the patient's current medications. Prior to Admission:  Medications Prior to Admission  Medication Sig Dispense Refill Last Dose   amLODipine (NORVASC) 5 MG tablet Take 1 tablet by mouth once daily (Patient taking differently: Take 5 mg by mouth daily.) 90 tablet 0 09/27/2020   Cyanocobalamin (VITAMIN B-12 PO) Take 1 tablet by mouth daily with breakfast.    09/27/2020   ELIQUIS 5 MG  TABS tablet Take 1 tablet by mouth twice daily (Patient taking differently: Take 5 mg by mouth 2 (two) times daily.) 180 tablet 1 09/27/2020 at 2000   ezetimibe (ZETIA) 10 MG tablet Take 10 mg by mouth daily.   09/27/2020   ferrous sulfate 325 (65 FE) MG tablet Take 1 tablet (325 mg total) by mouth 2 (two) times daily after a meal. 60 tablet 2 09/27/2020   furosemide (LASIX) 80 MG tablet TAKE 1 TABLET BY MOUTH ONCE DAILY(SCHEDULE APPT FOR FUTURE REFILLS) (Patient taking differently: Take 80 mg by mouth daily.) 90 tablet 0 09/27/2020   hydrocortisone cream 1 % Apply 1 application topically 2 (two) times daily as needed (for itching- apply to affected areas).    unk   Multiple Vitamins-Minerals (PRESERVISION AREDS PO) Take 1 tablet by mouth 2 (two) times daily.   09/27/2020   COVID-19 mRNA vaccine, Pfizer, 30 MCG/0.3ML injection USE AS DIRECTED .3 mL 0    Scheduled:  amLODipine  5 mg Oral Daily    apixaban  5 mg Oral BID   ezetimibe  10 mg Oral Daily   famotidine  20 mg Oral Daily   furosemide  80 mg Oral Daily   sodium chloride flush  3 mL Intravenous Q12H   Continuous:  remdesivir 200 mg in sodium chloride 0.9% 250 mL IVPB     Followed by   Derrill Memo ON 09/30/2020] remdesivir 100 mg in NS 100 mL     [id number], fentaNYL (SUBLIMAZE) injection, hydrocortisone cream, ondansetron **OR** ondansetron (ZOFRAN) IV  Results for orders placed or performed during the hospital encounter of 09/28/20 (from the past 48 hour(s))  Comprehensive metabolic panel     Status: Abnormal   Collection Time: 09/28/20  7:31 AM  Result Value Ref Range   Sodium 140 135 - 145 mmol/L   Potassium 3.5 3.5 - 5.1 mmol/L   Chloride 104 98 - 111 mmol/L   CO2 25 22 - 32 mmol/L   Glucose, Bld 169 (H) 70 - 99 mg/dL    Comment: Glucose reference range applies only to samples taken after fasting for at least 8 hours.   BUN 12 8 - 23 mg/dL   Creatinine, Ser 0.97 0.44 - 1.00 mg/dL   Calcium 8.5 (L) 8.9 - 10.3 mg/dL   Total Protein 6.2 (L) 6.5 - 8.1 g/dL   Albumin 3.5 3.5 - 5.0 g/dL   AST 33 15 - 41 U/L   ALT 21 0 - 44 U/L   Alkaline Phosphatase 96 38 - 126 U/L   Total Bilirubin 0.6 0.3 - 1.2 mg/dL   GFR, Estimated 53 (L) >60 mL/min    Comment: (NOTE) Calculated using the CKD-EPI Creatinine Equation (2021)    Anion gap 11 5 - 15    Comment: Performed at [facility] [address] [place], [place] [address]  CBC with Differential     Status: Abnormal   Collection Time: 09/28/20  7:31 AM  Result Value Ref Range   WBC 8.9 4.0 - 10.5 K/uL   RBC 3.98 3.87 - 5.11 MIL/uL   Hemoglobin 12.7 12.0 - 15.0 g/dL   HCT 41.0 36.0 - 46.0 %   MCV 103.0 (H) 80.0 - 100.0 fL   MCH 31.9 26.0 - 34.0 pg   MCHC 31.0 30.0 - 36.0 g/dL   RDW 15.0 11.5 - 15.5 %   Platelets 225 150 - 400 K/uL   nRBC 0.0 0.0 - 0.2 %   Neutrophils Relative % 80 %  Neutro Abs 7.3 1.7 - 7.7 K/uL   Lymphocytes Relative 10 %   Lymphs Abs 0.8  0.7 - 4.0 K/uL   Monocytes Relative 5 %   Monocytes Absolute 0.5 0.1 - 1.0 K/uL   Eosinophils Relative 3 %   Eosinophils Absolute 0.2 0.0 - 0.5 K/uL   Basophils Relative 1 %   Basophils Absolute 0.0 0.0 - 0.1 K/uL   Immature Granulocytes 1 %   Abs Immature Granulocytes 0.05 0.00 - 0.07 K/uL    Comment: Performed at [facility] [address], [place], [place] [address]  Lactic acid, plasma     Status: Abnormal   Collection Time: 09/28/20  7:32 AM  Result Value Ref Range   Lactic Acid, Venous 6.0 (HH) 0.5 - 1.9 mmol/L    Comment: CRITICAL RESULT CALLED TO, READ BACK BY AND VERIFIED WITH: Alyssa Grove RN BY SSTEPHENS [id number] Performed at [facility], [place] [address], [place], [place] [address]   Lactic acid, plasma     Status: Abnormal   Collection Time: 09/28/20  9:32 AM  Result Value Ref Range   Lactic Acid, Venous 2.4 (HH) 0.5 - 1.9 mmol/L    Comment: CRITICAL VALUE NOTED.  VALUE IS CONSISTENT WITH PREVIOUSLY REPORTED AND CALLED VALUE. Performed at [facility], [place] [address], [place], [place] [address]   Resp Panel by RT-PCR (Flu A&B, Covid) Nasopharyngeal Swab     Status: Abnormal   Collection Time: 09/28/20  2:23 PM   Specimen: Nasopharyngeal Swab; Nasopharyngeal(NP) swabs in vial transport medium  Result Value Ref Range   SARS Coronavirus 2 by RT PCR POSITIVE (A) NEGATIVE    Comment: RESULT CALLED TO, READ BACK BY AND VERIFIED WITH: RN CHRISTINA.C AT [id number] ON 09/28/2020 BY PAW. (NOTE) SARS-CoV-2 target nucleic acids are DETECTED.  The SARS-CoV-2 RNA is generally detectable in upper respiratory specimens during the acute phase of infection. Positive results are indicative of the presence of the identified virus, but do not rule out bacterial infection or co-infection with other pathogens not detected by the test. Clinical correlation with patient history and other diagnostic information is necessary to determine patient infection status. The expected  result is Negative.  Fact Sheet for Patients: EntrepreneurPulse.com.au  Fact Sheet for Healthcare Providers: IncredibleEmployment.be  This test is not yet approved or cleared by the [place] FDA and  has been authorized for detection and/or diagnosis of SARS-CoV-2 by FDA under an Emergency Use Authorization (EUA).  This EUA will remain in effect (meaning this  test can be used) for the duration of  the COVID-19 declaration under Section 564(b)(1) of the Act, 21 U.S.C. section 360bbb-3(b)(1), unless the authorization is terminated or revoked sooner.     Influenza A by PCR NEGATIVE NEGATIVE   Influenza B by PCR NEGATIVE NEGATIVE    Comment: (NOTE) The Xpert Xpress SARS-CoV-2/FLU/RSV plus assay is intended as an aid in the diagnosis of influenza from Nasopharyngeal swab specimens and should not be used as a sole basis for treatment. Nasal washings and aspirates are unacceptable for Xpert Xpress SARS-CoV-2/FLU/RSV testing.  Fact Sheet for Patients: EntrepreneurPulse.com.au  Fact Sheet for Healthcare Providers: IncredibleEmployment.be  This test is not yet approved or cleared by the [place] FDA and has been authorized for detection and/or diagnosis of SARS-CoV-2 by FDA under an Emergency Use Authorization (EUA). This EUA will remain in effect (meaning this test can be used) for the duration of the COVID-19 declaration under Section 564(b)(1) of  the Act, 21 U.S.C. section 360bbb-3(b)(1), unless the authorization is terminated or revoked.  Performed at [facility], [place] [address], [place], [place] [address]   Brain natriuretic peptide     Status: Abnormal   Collection Time: 09/28/20  3:20 PM  Result Value Ref Range   B Natriuretic Peptide 755.6 (H) 0.0 - 100.0 pg/mL    Comment: Performed at [facility] [address] [place], [place] [address]  Heparin level (unfractionated)      Status: Abnormal   Collection Time: 09/28/20  8:40 PM  Result Value Ref Range   Heparin Unfractionated 0.24 (L) 0.30 - 0.70 IU/mL    Comment: (NOTE) The clinical reportable range upper limit is being lowered to >1.10 to align with the FDA approved guidance for the current laboratory assay.  If heparin results are below expected values, and patient dosage has  been confirmed, suggest follow up testing of antithrombin III levels. Performed at [facility], [place] [address] [place], [place] [address]   APTT     Status: Abnormal   Collection Time: 09/28/20  8:40 PM  Result Value Ref Range   aPTT 73 (H) 24 - 36 seconds    Comment:        IF BASELINE aPTT IS ELEVATED, SUGGEST PATIENT RISK ASSESSMENT BE USED TO DETERMINE APPROPRIATE ANTICOAGULANT THERAPY. Performed at [facility], [place] [address], [place], [place] [address]   CBC     Status: None   Collection Time: 09/29/20  1:55 AM  Result Value Ref Range   WBC 7.7 4.0 - 10.5 K/uL   RBC 4.00 3.87 - 5.11 MIL/uL   Hemoglobin 13.0 12.0 - 15.0 g/dL   HCT 39.9 36.0 - 46.0 %   MCV 99.8 80.0 - 100.0 fL   MCH 32.5 26.0 - 34.0 pg   MCHC 32.6 30.0 - 36.0 g/dL   RDW 15.3 11.5 - 15.5 %   Platelets 199 150 - 400 K/uL   nRBC 0.0 0.0 - 0.2 %    Comment: Performed at [facility], [place] [address] [place],  [id number]  Basic metabolic panel     Status: Abnormal   Collection Time: 09/29/20  1:55 AM  Result Value Ref Range   Sodium 138 135 - 145 mmol/L   Potassium 4.9 3.5 - 5.1 mmol/L    Comment: DELTA CHECK NOTED   Chloride 106 98 - 111 mmol/L   CO2 23 22 - 32 mmol/L   Glucose, Bld 137 (H) 70 - 99 mg/dL    Comment: Glucose reference range applies only to samples taken after fasting for at least 8 hours.   BUN 15 8 - 23 mg/dL   Creatinine, Ser 0.97 0.44 - 1.00 mg/dL   Calcium 8.5 (L) 8.9 - 10.3 mg/dL   GFR, Estimated 53 (L) >60 mL/min    Comment: (NOTE) Calculated using the CKD-EPI Creatinine Equation (2021)     Anion gap 9 5 - 15    Comment: Performed at [facility] [address], [place], [place] [address]  Heparin level (unfractionated)     Status: None   Collection Time: 09/29/20  9:01 AM  Result Value Ref Range   Heparin Unfractionated 0.34 0.30 - 0.70 IU/mL    Comment: (NOTE) The clinical reportable range upper limit is being lowered to >1.10 to align with the FDA approved guidance for the current laboratory assay.  If heparin results are below expected values, and  patient dosage has  been confirmed, suggest follow up testing of antithrombin III levels. Performed at [facility], [place] [address], [place], [place] [address]     MR PELVIS W WO CONTRAST  Result Date: 09/28/2020 CLINICAL DATA:  Left adnexal mass on recent CT. EXAM: MRI PELVIS WITHOUT AND WITH CONTRAST TECHNIQUE: Multiplanar multisequence MR imaging of the pelvis was performed both before and after administration of intravenous contrast. CONTRAST:  9m GADAVIST GADOBUTROL 1 MMOL/ML IV SOLN COMPARISON:  CTA on 09/29/2018 FINDINGS: Image degradation by motion artifact noted. Lower Urinary Tract: Unremarkable appearance of urinary bladder. Bowel: Severe diverticulosis of sigmoid colon is seen, however there is no evidence of diverticulitis. Vascular/Lymphatic: Unremarkable. No pathologically enlarged pelvic lymph nodes identified. Reproductive: -- Uterus: Surgically absent. Vaginal cuff is unremarkable in appearance. -- Right ovary: Not visualized, however no adnexal mass identified. -- Left ovary: A bilobed solid mass is seen in the left adnexa which measures 9.6 x 5.1 by 6.3 cm. This shows homogeneous T2 hypointensity, and differential diagnosis includes ovarian fibrothecoma and broad ligament fibroid. Other: Small amount of free fluid is seen in the pelvic cul-de-sac. Musculoskeletal:  Unremarkable. IMPRESSION: 9.6 cm solid mass in the left adnexa, with differential diagnosis of ovarian fibrothecoma and broad ligament  fibroid. Small amount of free fluid in pelvic cul-de-sac. Prior hysterectomy. Severe diverticulosis of sigmoid colon, without evidence of diverticulitis. Electronically Signed   By: JMarlaine HindM.D.   On: 09/28/2020 18:05   CT Angio Aortobifemoral W and/or Wo Contrast  Result Date: 09/28/2020 CLINICAL DATA:  New onset right foot pain traveling up to right knee which began this morning. Unable to palpate pedal pulses. EXAM: CT ANGIOGRAPHY OF ABDOMINAL AORTA WITH ILIOFEMORAL RUNOFF TECHNIQUE: Multidetector CT imaging of the abdomen, pelvis and lower extremities was performed using the standard protocol during bolus administration of intravenous contrast. Multiplanar CT image reconstructions and MIPs were obtained to evaluate the vascular anatomy. CONTRAST:  1022mOMNIPAQUE IOHEXOL 350 MG/ML SOLN COMPARISON:  None. FINDINGS: VASCULAR Aorta: Diffuse calcified atheromatous plaque without aneurysm or flow-limiting stenosis. 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 The left gastric, common hepatic and splenic arteries are patent. SMA: 70% stenosis at the origin of the superior mesenteric artery predominately due to noncalcified atheromatous plaque. The artery is patent beyond this region focal stenosis. Renals: Severe stenosis at the origin of the right main renal artery. Mild narrowing at the origin of the left main renal artery. IMA: Severe stenosis of the origin of the inferior mesenteric artery which is otherwise patent. RIGHT Lower Extremity Inflow: Approximately 60% stenosis at the origin of the common iliac artery due to calcified plaque. Common iliac artery otherwise diffusely calcified but patent. No significant narrowing of the internal iliac artery. Focal stenosis of the mid external iliac artery measuring approximately 50% in its mid segment. Outflow: Evaluation the common femoral artery is limited due to streak artifact from a  total hip prosthesis. The common femoral artery appears occluded. There is flow within the Profunda femoris artery likely due to collateral flow. The superficial femoral artery is occluded. There is reconstitution of flow within the popliteal artery on the delayed phase imaging. There is focal stenosis of the distal popliteal artery measuring approximately 70%. Runoff: Tibioperoneal trunk is patent. There is discontinuous opacification of the posterior tibial artery. No significant ossification of the anterior tibial artery. The peroneal artery is patent to the ankle. LEFT Lower Extremity Inflow:  Approximately 50% stenosis at the origin of the common iliac artery due to calcified plaque. Common iliac artery is diffusely calcified but otherwise patent. Moderate to severe stenosis at the origin of the internal iliac artery which is otherwise patent. No significant stenosis of the external iliac artery. Outflow: No significant stenosis of the common femoral or Profunda femoris arteries. Focal stenosis of the proximal superficial femoral artery measuring up to 40%. Multiple additional sites of mild stenosis seen in the distal segment of the SFA measuring up to 30%. Left popliteal artery is patent in its proximal and mid segment but occludes in its distal segment. Runoff: The tibioperoneal trunk is occluded. There is faint reconstitution of flow in the anterior tibial artery which is occluded from mid to distal lower leg. Flow reconstitutes within the proximal posterior tibial and peroneal arteries which are patent to the level of the ankle. Veins: No obvious venous abnormality within the limitations of this arterial phase study. Review of the MIP images confirms the above findings. NON-VASCULAR Lower chest: Mild cardiomegaly.  Mild bibasilar atelectasis. Hepatobiliary: No focal liver abnormality is seen. No gallstones, gallbladder wall thickening, or biliary dilatation. Pancreas: Diffuse fatty atrophy of the pancreas.  Spleen: Normal in size without focal abnormality. Adrenals/Urinary Tract: Right adrenal gland is not well visualized. No significant abnormality of the left adrenal gland. Hyperdensity within the renal collecting systems most likely due to early excretion of contrast rather than medullary nephrocalcinosis. Bladder evaluation limited due to streak artifact from bilateral total hip prostheses. Stomach/Bowel: Diffuse colonic diverticulosis, most extensive in the sigmoid. No evidence of acute diverticulitis. Appendix is not definitively visualized. Large duodenal diverticulum is noted. No small bowel dilatation. Lymphatic: No enlarged abdominal or pelvic lymph nodes. Reproductive: A lobulated mass is noted in the left adnexa measuring 10.2 x 4.8 x 5.9 cm. The exact origin of this mass is difficult to determine, however it is most likely arising from the left ovary. Other: No abdominal wall hernia or abnormality. No abdominopelvic ascites. Musculoskeletal: Bilateral total hip prostheses are present. Degenerative changes are seen throughout the lumbar spine. IMPRESSION: VASCULAR 1. 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 Flow reconstitutes within the popliteal artery. Single-vessel runoff to the right ankle through the peroneal artery. 2. Occluded distal left popliteal artery and tibioperoneal trunk. Two vessel runoff to the left ankle through posterior tibial and peroneal arteries. 3. 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 70% stenosis at the origin of the superior mesenteric artery. High-grade stenosis of the origin of the right renal artery. NON-VASCULAR 1. 10.2 x 4.8 x 5.9 cm left adnexal mass most likely of ovarian origin. This highly suspicious for malignancy. Gynecological consultation is recommended. Electronically Signed   By: Miachel Roux M.D.   On: 09/28/2020 11:01   DG CHEST PORT 1 VIEW  Result Date: 09/28/2020 CLINICAL DATA:  Arterial occlusion, lower  extremity EXAM: PORTABLE CHEST 1 VIEW COMPARISON:  Radiograph 08/15/2020, chest CT 03/09/2017 FINDINGS: Unchanged cardiomediastinal silhouette with aortic arch calcifications. The heart is mildly enlarged, unchanged. There are mild diffuse interstitial opacities. Old right rib injuries noted. Bilateral shoulder degenerative changes. IMPRESSION: Mild interstitial pulmonary edema.  Unchanged cardiomegaly. Electronically Signed   By: Maurine Simmering M.D.   On: 09/28/2020 16:00    Review of Systems  Constitutional:  Negative for appetite change and unexpected weight change.  Gastrointestinal:  Negative for abdominal pain and constipation.  Genitourinary:  Negative for dysuria, frequency, pelvic pain and urgency.  Blood pressure  118/77, pulse 100, temperature 98.4 F (36.9 C), temperature source Oral, resp. rate 20, height '5\' 3"'$  (1.6 m), weight 72.1 kg, SpO2 97 %. Physical Exam Abdominal:     General: There is no distension.     Palpations: Abdomen is soft. There is no mass.     Tenderness: There is no abdominal tenderness.  Neurological:     Mental Status: She is alert.    Assessment/Plan: Newly diagnosed solid, left-sided adnexal mass. Suspect a neoplasm.   Advanced EOC w/thrombogenic state unlikely.    >review the tumor markers >will follow w/you; consideration could be given to an extirpating procedure  Lahoma Crocker 09/29/2020, 12:31 PM

## 2020-09-29 NOTE — Progress Notes (Signed)
Patients granddaughter, Arrie Aran, requests to speak to MD about patient regarding possibility of having/not having thrombectomy. MD notified and given number to contact [facility].  Daymon Larsen, RN

## 2020-09-29 NOTE — Progress Notes (Signed)
Patient unaware of surroundings. Patient removed PIV. Son present.

## 2020-09-30 LAB — COMPREHENSIVE METABOLIC PANEL
ALT: 40 U/L (ref 0–44)
AST: 56 U/L — ABNORMAL HIGH (ref 15–41)
Albumin: 2.9 g/dL — ABNORMAL LOW (ref 3.5–5.0)
Alkaline Phosphatase: 77 U/L (ref 38–126)
Anion gap: 8 (ref 5–15)
BUN: 19 mg/dL (ref 8–23)
CO2: 25 mmol/L (ref 22–32)
Calcium: 8.3 mg/dL — ABNORMAL LOW (ref 8.9–10.3)
Chloride: 105 mmol/L (ref 98–111)
Creatinine, Ser: 1.16 mg/dL — ABNORMAL HIGH (ref 0.44–1.00)
GFR, Estimated: 43 mL/min — ABNORMAL LOW (ref >60)
Glucose, Bld: 132 mg/dL — ABNORMAL HIGH (ref 70–99)
Potassium: 3.7 mmol/L (ref 3.5–5.1)
Sodium: 138 mmol/L (ref 135–145)
Total Bilirubin: 0.8 mg/dL (ref 0.3–1.2)
Total Protein: 5.6 g/dL — ABNORMAL LOW (ref 6.5–8.1)

## 2020-09-30 LAB — CBC
HCT: 37.2 % (ref 36.0–46.0)
Hemoglobin: 12 g/dL (ref 12.0–15.0)
MCH: 31.9 pg (ref 26.0–34.0)
MCHC: 32.3 g/dL (ref 30.0–36.0)
MCV: 98.9 fL (ref 80.0–100.0)
Platelets: 176 10*3/uL (ref 150–400)
RBC: 3.76 MIL/uL — ABNORMAL LOW (ref 3.87–5.11)
RDW: 15.5 % (ref 11.5–15.5)
WBC: 9.1 10*3/uL (ref 4.0–10.5)
nRBC: 0 % (ref 0.0–0.2)

## 2020-09-30 LAB — C-REACTIVE PROTEIN: CRP: 13.8 mg/dL — ABNORMAL HIGH (ref <1.0)

## 2020-09-30 LAB — GLUCOSE, CAPILLARY: Glucose-Capillary: 110 mg/dL — ABNORMAL HIGH (ref 70–99)

## 2020-09-30 MED ORDER — SODIUM CHLORIDE 0.45 % IV SOLN: INTRAVENOUS | Status: DC

## 2020-09-30 NOTE — Evaluation (Signed)
Physical Therapy Evaluation Patient Details Name: Maria [name] MRN: [id number] DOB: 1922-11-07 Today's Date: 09/30/2020   History of Present Illness  Maria [name] is a 87 y.o. female with medical history significant of hypertension, hyperlipidemia, paroxysmal atrial fibrillation, anemia, and diastolic CHF last EF 60 to 65% presents with complaints of right leg pain that started possibly around 4:30 AM this. Patient COVID +   Clinical Impression  Patient received in bed, she is agreeable to PT assessment. Husband present at bedside. She is mod independent with bed mobility, transfers with min guard for steadying. Ambulated 20 feet with RW and min assist. Running into furniture on her right and had large loose stool while ambulating. Patient has decreased safety awareness and is at risk of falls. She will continue to benefit from skilled PT while here to improve balance, strength and safety with mobility.       Follow Up Recommendations Home health PT;Supervision for mobility/OOB    Equipment Recommendations  None recommended by PT    Recommendations for Other Services       Precautions / Restrictions Precautions Precautions: Fall Restrictions Weight Bearing Restrictions: No      Mobility  Bed Mobility Overal bed mobility: Modified Independent                  Transfers Overall transfer level: Needs assistance Equipment used: Rolling walker (2 wheeled) Transfers: Sit to/from Stand Sit to Stand: Min guard            Ambulation/Gait Ambulation/Gait assistance: Min guard;Min assist Gait Distance (Feet): 20 Feet Assistive device: Rolling walker (2 wheeled) Gait Pattern/deviations: Step-through pattern;Decreased step length - right;Decreased step length - left;Drifts right/left;Trunk flexed Gait velocity: decr   General Gait Details: Patient is unsteady with mobility. Requires assistance for obstacle avoidance in room. Min assist. While ambulating  patient had loose stool and was assisted to [facility], had to be washed down and socks changed. Decreased awareness of needing to go to the bathroom.  Stairs            Wheelchair Mobility    Modified Rankin (Stroke Patients Only)       Balance Overall balance assessment: Needs assistance;History of Falls Sitting-balance support: Feet supported Sitting balance-Leahy Scale: Good     Standing balance support: Bilateral upper extremity supported;During functional activity Standing balance-Leahy Scale: Poor Standing balance comment: requires assistance for safety, B UE support                             Pertinent Vitals/Pain Pain Assessment: No/denies pain    Home Living Family/patient expects to be discharged to:: Private residence Living Arrangements: Spouse/significant other Available Help at Discharge: Family;Available 24 hours/day Type of Home: House Home Access: Stairs to enter Entrance Stairs-Rails: Right;Left;Can reach both Entrance Stairs-Number of Steps: 3 Home Layout: One level Home Equipment: Cane - single point;Grab bars - tub/shower;Walker - 2 wheels Additional Comments: patient has had one fall in last few months.    Prior Function Level of Independence: Independent with assistive device(s)         Comments: still doing cooking/cleaning and ADLs, does not drive anymore. Had stopped going to [facility] stopped when the pandemic started. Goes out to eat often. Per husband she walks with cane at baseline. Has walker     Hand Dominance   Dominant Hand: Right    Extremity/Trunk Assessment   Upper Extremity Assessment Upper Extremity Assessment: Generalized weakness  Lower Extremity Assessment Lower Extremity Assessment: Generalized weakness    Cervical / Trunk Assessment Cervical / Trunk Assessment: Kyphotic  Communication   Communication: HOH  Cognition Arousal/Alertness: Awake/alert Behavior During Therapy: WFL for tasks  assessed/performed Overall Cognitive Status: Within Functional Limits for tasks assessed                                        General Comments      Exercises     Assessment/Plan    PT Assessment Patient needs continued PT services  PT Problem List Decreased strength;Decreased mobility;Decreased safety awareness;Decreased activity tolerance;Decreased balance;Decreased knowledge of use of DME       PT Treatment Interventions DME instruction;Therapeutic exercise;Gait training;Balance training;Stair training;Functional mobility training;Therapeutic activities;Patient/family education    PT Goals (Current goals can be found in the Care Plan section)  Acute Rehab PT Goals Patient Stated Goal: to go home PT Goal Formulation: With patient/family Time For Goal Achievement: 10/07/20 Potential to Achieve Goals: Good    Frequency Min 3X/week   Barriers to discharge Decreased caregiver support;Inaccessible home environment lives with husband, 3 steps to enter home    Co-evaluation               AM-PAC PT "6 Clicks" Mobility  Outcome Measure Help needed turning from your back to your side while in a flat bed without using bedrails?: A Little Help needed moving from lying on your back to sitting on the side of a flat bed without using bedrails?: A Little Help needed moving to and from a bed to a chair (including a wheelchair)?: A Little Help needed standing up from a chair using your arms (e.g., wheelchair or bedside chair)?: A Little Help needed to walk in hospital room?: A Little Help needed climbing 3-5 steps with a railing? : A Lot 6 Click Score: 17    End of Session Equipment Utilized During Treatment: Gait belt Activity Tolerance: Patient limited by fatigue Patient left: in bed;with call bell/phone within reach;with bed alarm set;with family/visitor present Nurse Communication: Mobility status PT Visit Diagnosis: Unsteadiness on feet (R26.81);History  of falling (Z91.81);Difficulty in walking, not elsewhere classified (R26.2);Muscle weakness (generalized) (M62.81)    Time: [id number] PT Time Calculation (min) (ACUTE ONLY): 25 min   Charges:   PT Evaluation $PT Eval Moderate Complexity: 1 Mod PT Treatments $Gait Training: 8-22 mins        [employer], PT, [facility] 09/30/20,12:22 PM

## 2020-09-30 NOTE — Progress Notes (Signed)
   VASCULAR SURGERY ASSESSMENT & PLAN:   PERIPHERAL VASCULAR DISEASE: No complaint of pain in her feet.  She is off her heparin and on Eliquis.  The feet remain viable with a dampened, monophasic peroneal on the right and a monophasic posterior tibial signal on the left.  I had a long discussion yesterday with the daughter to explain her ultimate decision to hold off on any surgery given the risk involved and given that she was asymptomatic with an adequately perfused foot.  Okay to ambulate from my standpoint I can arrange follow-up visit outpatient when she is discharged.   SUBJECTIVE:   Sleeping this morning.  I spoke to her son in the room.  PHYSICAL EXAM:   Vitals:   09/29/20 0732 09/29/20 1120 09/29/20 2058 09/29/20 2100  BP: 136/76 118/77 122/63 122/63  Pulse: (!) 106 100 88   Resp: '20 20 18   '$ Temp: 97.8 F (36.6 C) 98.4 F (36.9 C) 98.7 F (37.1 C) 98.7 F (37.1 C)  TempSrc: Axillary Oral Oral Axillary  SpO2: 92% 97%    Weight:      Height:       Dampened monophasic peroneal signal on the right. Monophasic posterior tibial signal on the left. Both feet are pink and appear adequately perfused.  No mottling.  LABS:   Lab Results  Component Value Date   WBC 9.1 09/30/2020   HGB 12.0 09/30/2020   HCT 37.2 09/30/2020   MCV 98.9 09/30/2020   PLT 176 09/30/2020   Lab Results  Component Value Date   CREATININE 1.16 (H) 09/30/2020   Lab Results  Component Value Date   INR 1.7 (H) 08/16/2020    PROBLEM LIST:    Principal Problem:   Arterial occlusion, lower extremity (HCC) Active Problems:   Dyslipidemia   Essential hypertension   Paroxysmal atrial fibrillation (HCC)   Chronic diastolic CHF (congestive heart failure) (HCC)   Adnexal mass   CURRENT MEDS:    amLODipine  5 mg Oral Daily   apixaban  5 mg Oral BID   ezetimibe  10 mg Oral Daily   famotidine  20 mg Oral Daily   furosemide  80 mg Oral Daily   sodium chloride flush  3 mL Intravenous Q12H     Deitra Mayo Office: [phone] 09/30/2020

## 2020-09-30 NOTE — Progress Notes (Signed)
CAYLIANA MAGNESS is a 87 y.o. female patient admitted. Awake, alert - oriented  X 4 - no acute distress noted.  VSS - Blood pressure (!) 128/52, pulse 79, temperature 97.7 F (36.5 C), resp. rate 17, height '5\' 3"'$  (1.6 m), weight 72.1 kg, SpO2 92 %.    IV in place, occlusive dsg intact without redness.  Orientation to room, and floor completed.  Admission INP armband ID verified with patient/family, and in place.   SR up x 2, fall assessment complete, with patient and family able to verbalize understanding of risk associated with falls, and verbalized understanding to call nsg before up out of bed.  Call light within reach, patient able to voice, and demonstrate understanding. No evidence of skin break down noted on exam.      Will cont to eval and treat per MD orders.  Hezzie Bump, RN 09/30/2020 8:58 PM

## 2020-09-30 NOTE — Progress Notes (Signed)
[facility] PROGRESS NOTE   JAYLEEN FIXICO [id number] DOB: 1922/10/08 DOA: 09/28/2020  PCP: Jani Gravel, MD  Brief History/Interval Summary: 87 y.o. female with medical history significant of hypertension, hyperlipidemia, paroxysmal atrial fibrillation, anemia, and diastolic CHF last EF 60 to 65% presents with complaints of right leg pain.  Concern was for vascular ischemia.  Seen by vascular surgery.  CT angiogram was done.  Patient was started on heparin infusion.  Hospitalize for further management.  Subsequently found to be positive for COVID-19.  Has been experiencing dry cough for the past week or so.    Consultants: Vascular surgery  Procedures: None  Antibiotics: Anti-infectives (From admission, onward)    Start     Dose/Rate Route Frequency Ordered Stop   09/30/20 1000  remdesivir 100 mg in sodium chloride 0.9 % 100 mL IVPB       See Hyperspace for full Linked Orders Report.   100 mg 200 mL/hr over 30 Minutes Intravenous Daily 09/29/20 1050 10/04/20 0959   09/29/20 1200  remdesivir 200 mg in sodium chloride 0.9% 250 mL IVPB       See Hyperspace for full Linked Orders Report.   200 mg 580 mL/hr over 30 Minutes Intravenous Once 09/29/20 1050 09/29/20 1328       Subjective/Interval History: Patient noted to be somnolent this morning though she is easily arousable.  No family at bedside this morning.  Patient denies any chest pain.  Shortness of breath seems to be improving.       Assessment/Plan:  Acute right foot pain with concern for acute ischemia Patient underwent CT angiogram which showed multiple occlusions.  Patient was taking Eliquis prior to admission for her atrial fibrillation.  She had been compliant with this medication.  Vascular surgery was consulted.  Patient was started on heparin.  Discussions were held with patient regarding any thrombectomy.  Patient was initially reluctant.   Patient reevaluated by Vascular surgery and they found  pulses in the right foot using Doppler.  They did not feel that there was any indication for surgical intervention.  Discussed with family members. Patient was started back on Eliquis.  Appears to be pain-free at this time.    Pneumonia due to COVID-19 Chest x-ray does raise concern for opacities.  Patient with a dry cough for the past week or so.  She mentions that she has been immunized against COVID-19.  She is on Eliquis so cannot be given Paxlovid.   Patient was started on Remdesivir.  Seems to be saturating normal on room air.  Will not initiate steroids at this time.  CRP noted to be elevated.  Continue to trend for now.    Left adnexal fibrothecoma and fibroid Concern was for malignancy.  MRI of the pelvis was done which raises concern for fibroid and fibrothecoma.  These appear to be benign lesions.  Will need for outpatient follow-up with GYN.  No indication for urgent intervention at this time.  Lactic acidosis Likely due to ischemia.  History of paroxysmal atrial fibrillation on chronic anticoagulation Not noted to be on any rate limiting drugs.  Eliquis was resumed.  Chronic diastolic CHF Moderate hypovolemic.  Gentle IV hydration.  We will hold her furosemide for couple of days.  Essential hypertension Continue amlodipine.  Dyslipidemia Continue Zetia  DVT Prophylaxis: On Eliquis Code Status: DNR Family Communication: Discussed with patient's son yesterday.  We will call him again today.  Disposition Plan: Will need to be seen by PT and  OT.  Hopefully return home when improved.  Status is: Inpatient  Remains inpatient appropriate because:IV treatments appropriate due to intensity of illness or inability to take PO and Inpatient level of care appropriate due to severity of illness  Dispo: The patient is from: Home              Anticipated d/c is to: Home              Patient currently is not medically stable to d/c.   Difficult to place patient  No       Medications: Scheduled:  amLODipine  5 mg Oral Daily   apixaban  5 mg Oral BID   ezetimibe  10 mg Oral Daily   famotidine  20 mg Oral Daily   furosemide  80 mg Oral Daily   sodium chloride flush  3 mL Intravenous Q12H   Continuous:  sodium chloride     remdesivir 100 mg in NS 100 mL 100 mg (09/30/20 1008)   [id number], fentaNYL (SUBLIMAZE) injection, haloperidol lactate, hydrocortisone cream, ondansetron **OR** ondansetron (ZOFRAN) IV   Objective:  Vital Signs  Vitals:   09/29/20 2058 09/29/20 2100 09/30/20 0600 09/30/20 0740  BP: 122/63 122/63  (!) 141/77  Pulse: 88   92  Resp: 18   19  Temp: 98.7 F (37.1 C) 98.7 F (37.1 C)  98.8 F (37.1 C)  TempSrc: Oral Axillary  Oral  SpO2:   99% 94%  Weight:      Height:        Intake/Output Summary (Last 24 hours) at 09/30/2020 1025 Last data filed at 09/29/2020 2100 Gross per 24 hour  Intake 1010 ml  Output 200 ml  Net 810 ml    Filed Weights   09/28/20 0728  Weight: 72.1 kg    General appearance: Somnolent but easily arousable.  In no distress Resp: Normal effort at rest.  Coarse breath sounds with no definite crackles or wheezing. Cardio: S1-S2 is normal regular.  No S3-S4.  No rubs murmurs or bruit GI: Abdomen is soft.  Nontender nondistended.  Bowel sounds are present normal.  No masses organomegaly Extremities: No edema.   Neurologic: No obvious focal neurological deficits.    Lab Results:  Data Reviewed: I have personally reviewed following labs and imaging studies  CBC: Recent Labs  Lab 09/28/20 0731 09/29/20 0155 09/30/20 0132  WBC 8.9 7.7 9.1  NEUTROABS 7.3  --   --   HGB 12.7 13.0 12.0  HCT 41.0 39.9 37.2  MCV 103.0* 99.8 98.9  PLT 225 199 176     Basic Metabolic Panel: Recent Labs  Lab 09/28/20 0731 09/29/20 0155 09/30/20 0132  NA 140 138 138  K 3.5 4.9 3.7  CL 104 106 105  CO2 '25 23 25  '$ GLUCOSE 169* 137* 132*  BUN '12 15 19  '$ CREATININE 0.97 0.97 1.16*   CALCIUM 8.5* 8.5* 8.3*     GFR: Estimated Creatinine Clearance: 27 mL/min (A) (by C-G formula based on SCr of 1.16 mg/dL (H)).  Liver Function Tests: Recent Labs  Lab 09/28/20 0731 09/30/20 0132  AST 33 56*  ALT 21 40  ALKPHOS 96 77  BILITOT 0.6 0.8  PROT 6.2* 5.6*  ALBUMIN 3.5 2.9*      Recent Results (from the past 240 hour(s))  Resp Panel by RT-PCR (Flu A&B, Covid) Nasopharyngeal Swab     Status: Abnormal   Collection Time: 09/28/20  2:23 PM   Specimen: Nasopharyngeal Swab; Nasopharyngeal(NP) swabs in  vial transport medium  Result Value Ref Range Status   SARS Coronavirus 2 by RT PCR POSITIVE (A) NEGATIVE Final    Comment: RESULT CALLED TO, READ BACK BY AND VERIFIED WITH: RN CHRISTINA.C AT [id number] ON 09/28/2020 BY PAW. (NOTE) SARS-CoV-2 target nucleic acids are DETECTED.  The SARS-CoV-2 RNA is generally detectable in upper respiratory specimens during the acute phase of infection. Positive results are indicative of the presence of the identified virus, but do not rule out bacterial infection or co-infection with other pathogens not detected by the test. Clinical correlation with patient history and other diagnostic information is necessary to determine patient infection status. The expected result is Negative.  Fact Sheet for Patients: EntrepreneurPulse.com.au  Fact Sheet for Healthcare Providers: IncredibleEmployment.be  This test is not yet approved or cleared by the [place] FDA and  has been authorized for detection and/or diagnosis of SARS-CoV-2 by FDA under an Emergency Use Authorization (EUA).  This EUA will remain in effect (meaning this  test can be used) for the duration of  the COVID-19 declaration under Section 564(b)(1) of the Act, 21 U.S.C. section 360bbb-3(b)(1), unless the authorization is terminated or revoked sooner.     Influenza A by PCR NEGATIVE NEGATIVE Final   Influenza B by PCR NEGATIVE NEGATIVE  Final    Comment: (NOTE) The Xpert Xpress SARS-CoV-2/FLU/RSV plus assay is intended as an aid in the diagnosis of influenza from Nasopharyngeal swab specimens and should not be used as a sole basis for treatment. Nasal washings and aspirates are unacceptable for Xpert Xpress SARS-CoV-2/FLU/RSV testing.  Fact Sheet for Patients: EntrepreneurPulse.com.au  Fact Sheet for Healthcare Providers: IncredibleEmployment.be  This test is not yet approved or cleared by the [place] FDA and has been authorized for detection and/or diagnosis of SARS-CoV-2 by FDA under an Emergency Use Authorization (EUA). This EUA will remain in effect (meaning this test can be used) for the duration of the COVID-19 declaration under Section 564(b)(1) of the Act, 21 U.S.C. section 360bbb-3(b)(1), unless the authorization is terminated or revoked.  Performed at [facility], [place] [address] [place], [place] [address]        Radiology Studies: MR PELVIS W WO CONTRAST  Result Date: 09/28/2020 CLINICAL DATA:  Left adnexal mass on recent CT. EXAM: MRI PELVIS WITHOUT AND WITH CONTRAST TECHNIQUE: Multiplanar multisequence MR imaging of the pelvis was performed both before and after administration of intravenous contrast. CONTRAST:  35m GADAVIST GADOBUTROL 1 MMOL/ML IV SOLN COMPARISON:  CTA on 09/29/2018 FINDINGS: Image degradation by motion artifact noted. Lower Urinary Tract: Unremarkable appearance of urinary bladder. Bowel: Severe diverticulosis of sigmoid colon is seen, however there is no evidence of diverticulitis. Vascular/Lymphatic: Unremarkable. No pathologically enlarged pelvic lymph nodes identified. Reproductive: -- Uterus: Surgically absent. Vaginal cuff is unremarkable in appearance. -- Right ovary: Not visualized, however no adnexal mass identified. -- Left ovary: A bilobed solid mass is seen in the left adnexa which measures 9.6 x 5.1 by 6.3 cm. This shows  homogeneous T2 hypointensity, and differential diagnosis includes ovarian fibrothecoma and broad ligament fibroid. Other: Small amount of free fluid is seen in the pelvic cul-de-sac. Musculoskeletal:  Unremarkable. IMPRESSION: 9.6 cm solid mass in the left adnexa, with differential diagnosis of ovarian fibrothecoma and broad ligament fibroid. Small amount of free fluid in pelvic cul-de-sac. Prior hysterectomy. Severe diverticulosis of sigmoid colon, without evidence of diverticulitis. Electronically Signed   By: JMarlaine HindM.D.   On: 09/28/2020 18:05   DG CHEST PORT 1  VIEW  Result Date: 09/28/2020 CLINICAL DATA:  Arterial occlusion, lower extremity EXAM: PORTABLE CHEST 1 VIEW COMPARISON:  Radiograph 08/15/2020, chest CT 03/09/2017 FINDINGS: Unchanged cardiomediastinal silhouette with aortic arch calcifications. The heart is mildly enlarged, unchanged. There are mild diffuse interstitial opacities. Old right rib injuries noted. Bilateral shoulder degenerative changes. IMPRESSION: Mild interstitial pulmonary edema.  Unchanged cardiomegaly. Electronically Signed   By: Maurine Simmering M.D.   On: 09/28/2020 16:00       LOS: 2 days   [facility] Pager on [email or web address]  09/30/2020, 10:25 AM

## 2020-10-01 ENCOUNTER — Inpatient Hospital Stay (HOSPITAL_COMMUNITY): Payer: Medicare Other

## 2020-10-01 LAB — COMPREHENSIVE METABOLIC PANEL
ALT: 45 U/L — ABNORMAL HIGH (ref 0–44)
AST: 52 U/L — ABNORMAL HIGH (ref 15–41)
Albumin: 2.8 g/dL — ABNORMAL LOW (ref 3.5–5.0)
Alkaline Phosphatase: 68 U/L (ref 38–126)
Anion gap: 9 (ref 5–15)
BUN: 18 mg/dL (ref 8–23)
CO2: 25 mmol/L (ref 22–32)
Calcium: 7.9 mg/dL — ABNORMAL LOW (ref 8.9–10.3)
Chloride: 104 mmol/L (ref 98–111)
Creatinine, Ser: 0.93 mg/dL (ref 0.44–1.00)
GFR, Estimated: 56 mL/min — ABNORMAL LOW (ref >60)
Glucose, Bld: 116 mg/dL — ABNORMAL HIGH (ref 70–99)
Potassium: 3.1 mmol/L — ABNORMAL LOW (ref 3.5–5.1)
Sodium: 138 mmol/L (ref 135–145)
Total Bilirubin: 0.7 mg/dL (ref 0.3–1.2)
Total Protein: 5.1 g/dL — ABNORMAL LOW (ref 6.5–8.1)

## 2020-10-01 LAB — CBC
HCT: 35.3 % — ABNORMAL LOW (ref 36.0–46.0)
Hemoglobin: 11.3 g/dL — ABNORMAL LOW (ref 12.0–15.0)
MCH: 31.8 pg (ref 26.0–34.0)
MCHC: 32 g/dL (ref 30.0–36.0)
MCV: 99.4 fL (ref 80.0–100.0)
Platelets: 148 10*3/uL — ABNORMAL LOW (ref 150–400)
RBC: 3.55 MIL/uL — ABNORMAL LOW (ref 3.87–5.11)
RDW: 15.3 % (ref 11.5–15.5)
WBC: 7.6 10*3/uL (ref 4.0–10.5)
nRBC: 0 % (ref 0.0–0.2)

## 2020-10-01 LAB — C-REACTIVE PROTEIN: CRP: 13.7 mg/dL — ABNORMAL HIGH (ref <1.0)

## 2020-10-01 MED ORDER — POTASSIUM CHLORIDE 20 MEQ PO PACK
40.0000 meq | PACK | Freq: Once | ORAL | Status: AC
  Administered 2020-10-01: 40 meq via ORAL
  Filled 2020-10-01: qty 2

## 2020-10-01 MED ORDER — METHYLPREDNISOLONE SODIUM SUCC 40 MG IJ SOLR
40.0000 mg | INTRAMUSCULAR | Status: DC
  Administered 2020-10-01 – 2020-10-03 (×3): 40 mg via INTRAVENOUS
  Filled 2020-10-01 (×3): qty 1

## 2020-10-01 NOTE — Progress Notes (Signed)
[facility] PROGRESS NOTE   Maria [name] [id number] DOB: 12-20-1922 DOA: 09/28/2020  PCP: Jani Gravel, MD  Brief History/Interval Summary: 87 y.o. female with medical history significant of hypertension, hyperlipidemia, paroxysmal atrial fibrillation, anemia, and diastolic CHF last EF 60 to 65% presents with complaints of right leg pain.  Concern was for vascular ischemia.  Seen by vascular surgery.  CT angiogram was done.  Patient was started on heparin infusion.  Hospitalize for further management.  Subsequently found to be positive for COVID-19.  Has been experiencing dry cough for the past week or so.    Patient evaluated daily by vascular surgery.  No indication for surgery at this time.  Patient was started on Remdesivir for COVID-19 pneumonia.  Consultants: Vascular surgery  Procedures: None  Antibiotics: Anti-infectives (From admission, onward)    Start     Dose/Rate Route Frequency Ordered Stop   09/30/20 1000  remdesivir 100 mg in sodium chloride 0.9 % 100 mL IVPB       See Hyperspace for full Linked Orders Report.   100 mg 200 mL/hr over 30 Minutes Intravenous Daily 09/29/20 1050 10/04/20 0959   09/29/20 1200  remdesivir 200 mg in sodium chloride 0.9% 250 mL IVPB       See Hyperspace for full Linked Orders Report.   200 mg 580 mL/hr over 30 Minutes Intravenous Once 09/29/20 1050 09/29/20 1328        Subjective/Interval History: Patient noted to be much more awake and alert today.  Continues to have cough and occasional shortness of breath.  Denies any chest pain.  No nausea or vomiting.       Assessment/Plan:  Acute right foot pain with concern for acute ischemia Patient underwent CT angiogram which showed multiple occlusions.  Patient was taking Eliquis prior to admission for her atrial fibrillation.  She had been compliant with this medication.  Vascular surgery was consulted.  Patient was started on heparin.  Discussions were held with  patient regarding any thrombectomy.  Patient was initially reluctant.   Patient reevaluated by Vascular surgery and they found pulses in the right foot using Doppler.  They did not feel that there was any indication for surgical intervention.  Discussed with family members. Patient was started back on Eliquis.  Remained stable.  Outpatient follow-up with vascular surgery.  Pneumonia due to COVID-19 Chest x-ray did raise concern for opacities.  Patient with a dry cough for the past week or so.  She mentions that she has been immunized against COVID-19.  She is on Eliquis so cannot be given Paxlovid.   Patient was started on Remdesivir.  Overnight she was placed on 2 L of oxygen by nasal cannula.  We will repeat chest x-ray today.  CRP noted to be elevated.  Will initiate Solu-Medrol at a lower dose because of her advanced age.  Incentive spirometry.     Left adnexal fibrothecoma and fibroid Concern was for malignancy.  MRI of the pelvis was done which raises concern for fibroid and fibrothecoma.  These appear to be benign lesions.  Will need for outpatient follow-up with GYN.  No indication for urgent intervention at this time.  Lactic acidosis Likely due to ischemia.  History of paroxysmal atrial fibrillation on chronic anticoagulation Not noted to be on any rate limiting drugs.  Eliquis was resumed.  Chronic diastolic CHF Noted to be hypovolemic yesterday with mildly elevated creatinine.  She was gently hydrated with improvement.  We will stop IV fluids.  Furosemide on hold.  Can be resumed in 24 to 48 hours.  Essential hypertension/hypokalemia Continue amlodipine.  Replace potassium.  Monitor blood pressures.  Normocytic anemia Drop in hemoglobin is dilutional.  Recheck labs tomorrow.  Dyslipidemia Continue Zetia  DVT Prophylaxis: On Eliquis Code Status: DNR Family Communication: Son being updated daily. Disposition Plan: Seen by PT and OT.  Home health is recommended at  discharge.  Status is: Inpatient  Remains inpatient appropriate because:IV treatments appropriate due to intensity of illness or inability to take PO and Inpatient level of care appropriate due to severity of illness  Dispo: The patient is from: Home              Anticipated d/c is to: Home              Patient currently is not medically stable to d/c.   Difficult to place patient No       Medications: Scheduled:  amLODipine  5 mg Oral Daily   apixaban  5 mg Oral BID   ezetimibe  10 mg Oral Daily   famotidine  20 mg Oral Daily   methylPREDNISolone (SOLU-MEDROL) injection  40 mg Intravenous Q24H   potassium chloride  40 mEq Oral Once   sodium chloride flush  3 mL Intravenous Q12H   Continuous:  sodium chloride 75 mL/hr at 10/01/20 [id number]   remdesivir 100 mg in NS 100 mL 100 mg (09/30/20 1008)   [id number], fentaNYL (SUBLIMAZE) injection, haloperidol lactate, hydrocortisone cream, ondansetron **OR** ondansetron (ZOFRAN) IV   Objective:  Vital Signs  Vitals:   09/30/20 2324 10/01/20 0321 10/01/20 0323 10/01/20 0740  BP: 123/65  130/78   Pulse: 88  81   Resp: 19  19   Temp: 97.9 F (36.6 C)  97.6 F (36.4 C) 98.3 F (36.8 C)  TempSrc: Oral  Oral Oral  SpO2: 99%  98%   Weight:  77 kg    Height:        Intake/Output Summary (Last 24 hours) at 10/01/2020 1100 Last data filed at 10/01/2020 0821 Gross per 24 hour  Intake 1903.53 ml  Output --  Net 1903.53 ml    Filed Weights   09/28/20 0728 10/01/20 0321  Weight: 72.1 kg 77 kg    General appearance: Awake alert.  In no distress Resp: Mildly tachypneic at rest.  No use of accessory muscles.  Coarse breath sounds with few crackles bilaterally.  No wheezing or rhonchi. Cardio: S1-S2 is normal regular.  No S3-S4.  No rubs murmurs or bruit GI: Abdomen is soft.  Nontender nondistended.  Bowel sounds are present normal.  No masses organomegaly Extremities: No edema.  Neurologic: No focal neurological deficits.       Lab Results:  Data Reviewed: I have personally reviewed following labs and imaging studies  CBC: Recent Labs  Lab 09/28/20 0731 09/29/20 0155 09/30/20 0132 10/01/20 0056  WBC 8.9 7.7 9.1 7.6  NEUTROABS 7.3  --   --   --   HGB 12.7 13.0 12.0 11.3*  HCT 41.0 39.9 37.2 35.3*  MCV 103.0* 99.8 98.9 99.4  PLT 225 199 176 148*     Basic Metabolic Panel: Recent Labs  Lab 09/28/20 0731 09/29/20 0155 09/30/20 0132 10/01/20 0056  NA 140 138 138 138  K 3.5 4.9 3.7 3.1*  CL 104 106 105 104  CO2 '25 23 25 25  '$ GLUCOSE 169* 137* 132* 116*  BUN '12 15 19 18  '$ CREATININE 0.97 0.97 1.16* 0.93  CALCIUM  8.5* 8.5* 8.3* 7.9*     GFR: Estimated Creatinine Clearance: 34.7 mL/min (by C-G formula based on SCr of 0.93 mg/dL).  Liver Function Tests: Recent Labs  Lab 09/28/20 0731 09/30/20 0132 10/01/20 0056  AST 33 56* 52*  ALT 21 40 45*  ALKPHOS 96 77 68  BILITOT 0.6 0.8 0.7  PROT 6.2* 5.6* 5.1*  ALBUMIN 3.5 2.9* 2.8*      Recent Results (from the past 240 hour(s))  Resp Panel by RT-PCR (Flu A&B, Covid) Nasopharyngeal Swab     Status: Abnormal   Collection Time: 09/28/20  2:23 PM   Specimen: Nasopharyngeal Swab; Nasopharyngeal(NP) swabs in vial transport medium  Result Value Ref Range Status   SARS Coronavirus 2 by RT PCR POSITIVE (A) NEGATIVE Final    Comment: RESULT CALLED TO, READ BACK BY AND VERIFIED WITH: RN CHRISTINA.C AT [id number] ON 09/28/2020 BY PAW. (NOTE) SARS-CoV-2 target nucleic acids are DETECTED.  The SARS-CoV-2 RNA is generally detectable in upper respiratory specimens during the acute phase of infection. Positive results are indicative of the presence of the identified virus, but do not rule out bacterial infection or co-infection with other pathogens not detected by the test. Clinical correlation with patient history and other diagnostic information is necessary to determine patient infection status. The expected result is Negative.  Fact Sheet for  Patients: EntrepreneurPulse.com.au  Fact Sheet for Healthcare Providers: IncredibleEmployment.be  This test is not yet approved or cleared by the [place] FDA and  has been authorized for detection and/or diagnosis of SARS-CoV-2 by FDA under an Emergency Use Authorization (EUA).  This EUA will remain in effect (meaning this  test can be used) for the duration of  the COVID-19 declaration under Section 564(b)(1) of the Act, 21 U.S.C. section 360bbb-3(b)(1), unless the authorization is terminated or revoked sooner.     Influenza A by PCR NEGATIVE NEGATIVE Final   Influenza B by PCR NEGATIVE NEGATIVE Final    Comment: (NOTE) The Xpert Xpress SARS-CoV-2/FLU/RSV plus assay is intended as an aid in the diagnosis of influenza from Nasopharyngeal swab specimens and should not be used as a sole basis for treatment. Nasal washings and aspirates are unacceptable for Xpert Xpress SARS-CoV-2/FLU/RSV testing.  Fact Sheet for Patients: EntrepreneurPulse.com.au  Fact Sheet for Healthcare Providers: IncredibleEmployment.be  This test is not yet approved or cleared by the [place] FDA and has been authorized for detection and/or diagnosis of SARS-CoV-2 by FDA under an Emergency Use Authorization (EUA). This EUA will remain in effect (meaning this test can be used) for the duration of the COVID-19 declaration under Section 564(b)(1) of the Act, 21 U.S.C. section 360bbb-3(b)(1), unless the authorization is terminated or revoked.  Performed at [facility], [place] [address], [place], [place] [address]        Radiology Studies: No results found.     LOS: 3 days   Kierstyn Baranowski [employer] on [email or web address]  10/01/2020, 11:00 AM

## 2020-10-01 NOTE — Progress Notes (Signed)
Physical Therapy Treatment Patient Details Name: Maria [name] MRN: [id number] DOB: 09-26-1922 Today's Date: 10/01/2020    History of Present Illness Maria [name] is a 87 y.o. female with medical history significant of hypertension, hyperlipidemia, paroxysmal atrial fibrillation, anemia, and diastolic CHF last EF 60 to 65% presents with complaints of right leg pain that started possibly around 4:30 AM this. Patient COVID +    PT Comments    Patient received in bed, extremely HOH and needed max multimodal cues to be able to understand therapist today. Able to progress gait distance slightly, but overall she was just very fatigued today. SpO2 as low as 88% on 3LPM O2 but recovered to 90s quickly post gait. Impulsive and with poor safety awareness. Left in bed with all needs met, bed alarm active and RN aware of patient status. Discussed case with son, and family prefers HHPT. Will continue efforts.     Follow Up Recommendations  Home health PT;Supervision for mobility/OOB     Equipment Recommendations  None recommended by PT    Recommendations for Other Services       Precautions / Restrictions Precautions Precautions: Fall Restrictions Weight Bearing Restrictions: No    Mobility  Bed Mobility Overal bed mobility: Modified Independent             General bed mobility comments: HOB elevated, increased time    Transfers Overall transfer level: Needs assistance Equipment used: Rolling walker (2 wheeled) Transfers: Sit to/from Stand Sit to Stand: Min guard         General transfer comment: min guard for safety, hand over hand assist for hand placement on RW with transfers  Ambulation/Gait Ambulation/Gait assistance: Min assist Gait Distance (Feet): 35 Feet Assistive device: Rolling walker (2 wheeled) Gait Pattern/deviations: Step-through pattern;Decreased step length - right;Decreased step length - left;Drifts right/left;Trunk flexed Gait velocity:  decr   General Gait Details: steady with RW but due to [facility] needs max visual and tactile cues for navigation in room, up to MinA for balance and management of RW   Stairs             Wheelchair Mobility    Modified Rankin (Stroke Patients Only)       Balance Overall balance assessment: Needs assistance;History of Falls Sitting-balance support: Feet supported Sitting balance-Leahy Scale: Good     Standing balance support: Bilateral upper extremity supported;During functional activity Standing balance-Leahy Scale: Poor Standing balance comment: requires assistance for safety, B UE support                            Cognition Arousal/Alertness: Awake/alert Behavior During Therapy: WFL for tasks assessed/performed Overall Cognitive Status: Within Functional Limits for tasks assessed                                        Exercises      General Comments General comments (skin integrity, edema, etc.): 88% on 3LPM O2, recovered back to 90s quickly      Pertinent Vitals/Pain Pain Assessment: No/denies pain    Home Living                      Prior Function            PT Goals (current goals can now be found in the care plan section) Acute Rehab PT Goals  Patient Stated Goal: to go home PT Goal Formulation: With patient/family Time For Goal Achievement: 10/07/20 Potential to Achieve Goals: Good Progress towards PT goals: Progressing toward goals    Frequency    Min 3X/week      PT Plan Current plan remains appropriate    Co-evaluation              AM-PAC PT "6 Clicks" Mobility   Outcome Measure  Help needed turning from your back to your side while in a flat bed without using bedrails?: A Little Help needed moving from lying on your back to sitting on the side of a flat bed without using bedrails?: A Little Help needed moving to and from a bed to a chair (including a wheelchair)?: A Little Help needed  standing up from a chair using your arms (e.g., wheelchair or bedside chair)?: A Little Help needed to walk in hospital room?: A Little Help needed climbing 3-5 steps with a railing? : A Little 6 Click Score: 18    End of Session   Activity Tolerance: Patient limited by fatigue Patient left: in bed;with call bell/phone within reach;with bed alarm set;with family/visitor present Nurse Communication: Mobility status PT Visit Diagnosis: Unsteadiness on feet (R26.81);History of falling (Z91.81);Difficulty in walking, not elsewhere classified (R26.2);Muscle weakness (generalized) (M62.81)     Time: 2353-6144 PT Time Calculation (min) (ACUTE ONLY): 25 min  Charges:  $Gait Training: 8-22 mins $Therapeutic Activity: 8-22 mins                    Windell Norfolk, DPT, PN2   Supplemental Physical Therapist [place]    Pager [phone] Acute Rehab Office [phone]

## 2020-10-01 NOTE — TOC Initial Note (Addendum)
Transition of Care [facility]) - Initial/Assessment Note    Patient Details  Name: Maria [name] MRN: [id number] Date of Birth: 1922-09-25  [facility] CM/SW Contact:    Marilu Favre, RN Phone Number: 10/01/2020, 2:38 PM  Clinical Narrative:                 PT and OT recommending home health if patient has supervision and help at home. PT worked with patient while husband at bedside. PT wanted NCM to confirm patient had assistance at home.   NCM called patient's son Ova Freshwater [phone]. Discussed PT note from yesterday recommending 24 hour supervision. Family can provide. At time of call PT note for today not entered. NCM asked PT to call Jenny Reichmann with how patient did today .   Patient has walker at home.   Patient recently discharged with [facility].   NCM confirmed with  Tommi Rumps with [facility] , received orders for HHPT/OT.   Expected Discharge Plan: [place]     Patient Goals and CMS Choice     Choice offered to / list presented to : Adult Children  Expected Discharge Plan and Services Expected Discharge Plan: [place]   Discharge Planning Services: CM Consult Post Acute Care Choice: [place] arrangements for the past 2 months: [place]                                      Prior Living Arrangements/Services Living arrangements for the past 2 months: Single Family Home Lives with:: Spouse Patient language and need for interpreter reviewed:: Yes Do you feel safe going back to the place where you live?: Yes          Current home services: DME Criminal Activity/Legal Involvement Pertinent to Current Situation/Hospitalization: No - Comment as needed  Activities of Daily Living      Permission Sought/Granted   Permission granted to share information with : Yes, Verbal Permission Granted  Share Information with NAME: Ova Freshwater [id number]  Permission granted to share info  w AGENCY: [facility]        Emotional Assessment              Admission diagnosis:  Arterial occlusion [I70.90] Arterial occlusion, lower extremity ([place]) [I70.209] Ovarian mass, left [N83.8] Atrial fibrillation, unspecified type [facility]) [I48.91] Patient Active Problem List   Diagnosis Date Noted   Arterial occlusion, lower extremity ([place]) 09/28/2020   Adnexal mass 09/28/2020   Acute respiratory failure with hypoxia ([place]) 08/16/2020   Generalized weakness 08/16/2020   Elevated troponin 08/16/2020   Chronic diastolic CHF (congestive heart failure) ([place]) 08/16/2020   Acute encephalopathy 08/15/2020   Paroxysmal atrial fibrillation ([place]) 01/29/2017   ARF (acute renal failure) ([place]) 11/25/2016   Hypertensive urgency    TIA (transient ischemic attack) 03/12/2015   Dependent edema 03/12/2015   Dysphagia    Dyslipidemia 11/16/2014   Essential hypertension 11/16/2014   Postop Acute blood loss anemia 06/01/2011   Osteoarthritis of hip 05/27/2011   PCP:  Jani Gravel, MD Pharmacy:   [facility] ([place] [place] [id number] [place]  [id number] Phone: [phone] Fax: [phone]     Social Determinants of Health (SDOH) Interventions    Readmission Risk Interventions No flowsheet data found.

## 2020-10-01 NOTE — Progress Notes (Signed)
   VASCULAR SURGERY ASSESSMENT & PLAN:   PERIPHERAL VASCULAR DISEASE: She has no complaints of pain in her feet.  Both feet have good color.  I have arranged follow-up in my office in 6 weeks.  Vascular surgery will be available as needed.  SUBJECTIVE:   No complaints this morning.  PHYSICAL EXAM:   Vitals:   09/30/20 2324 10/01/20 0321 10/01/20 0323 10/01/20 0740  BP: 123/65  130/78   Pulse: 88  81   Resp: 19  19   Temp: 97.9 F (36.6 C)  97.6 F (36.4 C) 98.3 F (36.8 C)  TempSrc: Oral  Oral Oral  SpO2: 99%  98%   Weight:  77 kg    Height:       Her feet are pink and appear adequately perfused.  LABS:   Lab Results  Component Value Date   WBC 7.6 10/01/2020   HGB 11.3 (L) 10/01/2020   HCT 35.3 (L) 10/01/2020   MCV 99.4 10/01/2020   PLT 148 (L) 10/01/2020   Lab Results  Component Value Date   CREATININE 0.93 10/01/2020   Lab Results  Component Value Date   INR 1.7 (H) 08/16/2020   CBG (last 3)  Recent Labs    09/30/20 2008  GLUCAP 110*    PROBLEM LIST:    Principal Problem:   Arterial occlusion, lower extremity (HCC) Active Problems:   Dyslipidemia   Essential hypertension   Paroxysmal atrial fibrillation (HCC)   Chronic diastolic CHF (congestive heart failure) (HCC)   Adnexal mass   CURRENT MEDS:    amLODipine  5 mg Oral Daily   apixaban  5 mg Oral BID   ezetimibe  10 mg Oral Daily   famotidine  20 mg Oral Daily   potassium chloride  40 mEq Oral Once   sodium chloride flush  3 mL Intravenous Q12H    Deitra Mayo Office: [phone] 10/01/2020

## 2020-10-01 NOTE — Evaluation (Signed)
Occupational Therapy Evaluation Patient Details Name: Maria [name] MRN: [id number] DOB: 14-Feb-1922 Today's Date: 10/01/2020    History of Present Illness Maria [name] is a 87 y.o. female with medical history significant of hypertension, hyperlipidemia, paroxysmal atrial fibrillation, anemia, and diastolic CHF last EF 60 to 65% presents with complaints of right leg pain that started possibly around 4:30 AM this. Patient COVID +   Clinical Impression   Pt lives with husband but on the same property of family members to assist as needed. Pt at requires supervision to min guard with bed mobility, transfers and room level ambulation with RW. Pt requires min to moderate assist with LE ADLS due to pt having posterior pelvic tilt with ADLs.  Pt currently with functional limitations due to the deficits listed below (see OT Problem List).  Pt will benefit from skilled OT to increase their safety and independence with ADL and functional mobility for ADL to facilitate discharge to venue listed below.      Follow Up Recommendations  Home health OT;Supervision/Assistance - 24 hour    Equipment Recommendations       Recommendations for Other Services       Precautions / Restrictions Precautions Precautions: Fall Restrictions Weight Bearing Restrictions: No      Mobility Bed Mobility Overal bed mobility: Modified Independent             General bed mobility comments: HOB elevated    Transfers Overall transfer level: Needs assistance Equipment used: Rolling walker (2 wheeled) Transfers: Sit to/from Stand Sit to Stand: Min guard              Balance Overall balance assessment: Needs assistance;History of Falls Sitting-balance support: Feet supported Sitting balance-Leahy Scale: Good     Standing balance support: Bilateral upper extremity supported;During functional activity Standing balance-Leahy Scale: Poor Standing balance comment: requires assistance for  safety, B UE support                           ADL either performed or assessed with clinical judgement   ADL Overall ADL's : Needs assistance/impaired Eating/Feeding: Independent;Cueing for safety;Cueing for sequencing;Sitting   Grooming: Wash/dry hands;Wash/dry face;Cueing for safety;Cueing for sequencing;Sitting;Set up   Upper Body Bathing: Set up;Cueing for sequencing;Sitting   Lower Body Bathing: Minimal assistance;Cueing for safety;Cueing for sequencing;Sit to/from stand   Upper Body Dressing : Set up;Cueing for safety;Cueing for sequencing;Sitting   Lower Body Dressing: Minimal assistance;Cueing for sequencing;Cueing for safety;Sit to/from stand   Toilet Transfer: Min guard;Cueing for safety;Cueing for sequencing;BSC   Toileting- Water quality scientist and Hygiene: Min guard;Cueing for safety;Cueing for sequencing;Sit to/from stand   Tub/ Banker: Cueing for safety;Cueing for sequencing;Ambulation;Min guard   Functional mobility during ADLs: Min guard;Cueing for safety;Cueing for sequencing;Rolling walker       Vision Baseline Vision/History: 1 Wears glasses Patient Visual Report: No change from baseline       Perception     Praxis      Pertinent Vitals/Pain Pain Assessment: No/denies pain     Hand Dominance Right   Extremity/Trunk Assessment Upper Extremity Assessment Upper Extremity Assessment: Generalized weakness   Lower Extremity Assessment Lower Extremity Assessment: Generalized weakness   Cervical / Trunk Assessment Cervical / Trunk Assessment: Kyphotic   Communication Communication Communication: HOH   Cognition Arousal/Alertness: Awake/alert Behavior During Therapy: WFL for tasks assessed/performed Overall Cognitive Status: Within Functional Limits for tasks assessed  General Comments       Exercises     Shoulder Instructions      Home Living Family/patient expects  to be discharged to:: Private residence Living Arrangements: Spouse/significant other Available Help at Discharge: Family;Available 24 hours/day Type of Home: House Home Access: Level entry;Stairs to enter (per pt has a flat entrance but per chart reports steps) Entrance Stairs-Number of Steps: 3 Entrance Stairs-Rails: Right;Left;Can reach both Home Layout: One level     Bathroom Shower/Tub: Occupational psychologist: Handicapped height     Home Equipment: [place] - single point;Grab bars - tub/shower;Walker - 2 wheels   Additional Comments: patient has had one fall in last few months.      Prior Functioning/Environment Level of Independence: Independent with assistive device(s)        Comments: still doing cooking/cleaning and ADLs, does not drive anymore. Had stopped going to [facility], stopped when the pandemic started. Goes out to eat often. Per husband she walks with cane at baseline. Has walker        OT Problem List: Decreased strength;Decreased activity tolerance;Impaired balance (sitting and/or standing);Decreased safety awareness;Decreased knowledge of use of DME or AE      OT Treatment/Interventions: Self-care/ADL training;Therapeutic exercise;DME and/or AE instruction;Therapeutic activities;Patient/family education;Balance training    OT Goals(Current goals can be found in the care plan section) Acute Rehab OT Goals Patient Stated Goal: to go home OT Goal Formulation: With patient Time For Goal Achievement: 10/12/20 Potential to Achieve Goals: Good  OT Frequency: Min 2X/week   Barriers to D/C:            Co-evaluation              AM-PAC OT "6 Clicks" Daily Activity     Outcome Measure Help from another person eating meals?: None Help from another person taking care of personal grooming?: A Little Help from another person toileting, which includes using toliet, bedpan, or urinal?: A Little Help from another person bathing (including washing,  rinsing, drying)?: A Lot Help from another person to put on and taking off regular upper body clothing?: None Help from another person to put on and taking off regular lower body clothing?: A Little 6 Click Score: 19   End of Session Equipment Utilized During Treatment: Gait belt;Rolling walker Nurse Communication: Mobility status;Other (comment) (o2 status)  Activity Tolerance: Patient tolerated treatment well Patient left: in bed;with call bell/phone within reach;with bed alarm set  OT Visit Diagnosis: Unsteadiness on feet (R26.81);Other abnormalities of gait and mobility (R26.89);History of falling (Z91.81)                Time: [id number] OT Time Calculation (min): 44 min Charges:  OT General Charges $OT Visit: 1 Visit OT Evaluation $OT Eval Low Complexity: 1 Low OT Treatments $Self Care/Home Management : 23-37 mins  Joeseph Amor OTR/L  Acute Rehab Services  [phone] office number [phone] pager number   Joeseph Amor 10/01/2020, 10:08 AM

## 2020-10-02 LAB — CBC
HCT: 36.6 % (ref 36.0–46.0)
Hemoglobin: 11.2 g/dL — ABNORMAL LOW (ref 12.0–15.0)
MCH: 31.4 pg (ref 26.0–34.0)
MCHC: 30.6 g/dL (ref 30.0–36.0)
MCV: 102.5 fL — ABNORMAL HIGH (ref 80.0–100.0)
Platelets: 174 10*3/uL (ref 150–400)
RBC: 3.57 MIL/uL — ABNORMAL LOW (ref 3.87–5.11)
RDW: 15.6 % — ABNORMAL HIGH (ref 11.5–15.5)
WBC: 5.5 10*3/uL (ref 4.0–10.5)
nRBC: 0 % (ref 0.0–0.2)

## 2020-10-02 LAB — COMPREHENSIVE METABOLIC PANEL
ALT: 49 U/L — ABNORMAL HIGH (ref 0–44)
AST: 56 U/L — ABNORMAL HIGH (ref 15–41)
Albumin: 3 g/dL — ABNORMAL LOW (ref 3.5–5.0)
Alkaline Phosphatase: 68 U/L (ref 38–126)
Anion gap: 14 (ref 5–15)
BUN: 18 mg/dL (ref 8–23)
CO2: 24 mmol/L (ref 22–32)
Calcium: 8.4 mg/dL — ABNORMAL LOW (ref 8.9–10.3)
Chloride: 101 mmol/L (ref 98–111)
Creatinine, Ser: 1.01 mg/dL — ABNORMAL HIGH (ref 0.44–1.00)
GFR, Estimated: 51 mL/min — ABNORMAL LOW (ref >60)
Glucose, Bld: 162 mg/dL — ABNORMAL HIGH (ref 70–99)
Potassium: 4 mmol/L (ref 3.5–5.1)
Sodium: 139 mmol/L (ref 135–145)
Total Bilirubin: 1 mg/dL (ref 0.3–1.2)
Total Protein: 5.7 g/dL — ABNORMAL LOW (ref 6.5–8.1)

## 2020-10-02 LAB — C-REACTIVE PROTEIN: CRP: 9.7 mg/dL — ABNORMAL HIGH (ref <1.0)

## 2020-10-02 MED ORDER — FUROSEMIDE 10 MG/ML IJ SOLN
40.0000 mg | Freq: Once | INTRAMUSCULAR | Status: AC
  Administered 2020-10-02: 40 mg via INTRAVENOUS
  Filled 2020-10-02: qty 4

## 2020-10-02 NOTE — Progress Notes (Signed)
Writer spoke to granddaughter Jolaine Click), she is concerned and wants to talk to [facility] about patient's health status and plan of care.  Phone # [phone]

## 2020-10-02 NOTE — Progress Notes (Signed)
[facility] PROGRESS NOTE   Maria [name] [id number] DOB: September 06, 1922 DOA: 09/28/2020  PCP: Jani Gravel, MD  Brief History/Interval Summary: 87 y.o. female with medical history significant of hypertension, hyperlipidemia, paroxysmal atrial fibrillation, anemia, and diastolic CHF last EF 60 to 65% presents with complaints of right leg pain.  Concern was for vascular ischemia.  Seen by vascular surgery.  CT angiogram was done.  Patient was started on heparin infusion.  Hospitalized for further management.  Subsequently found to be positive for COVID-19.  Has been experiencing dry cough for the past week or so.    Patient evaluated daily by vascular surgery.  No indication for surgery at this time.  Patient was started on Remdesivir for COVID-19 pneumonia.  Consultants: Vascular surgery  Procedures: None  Antibiotics: Anti-infectives (From admission, onward)    Start     Dose/Rate Route Frequency Ordered Stop   09/30/20 1000  remdesivir 100 mg in sodium chloride 0.9 % 100 mL IVPB       See Hyperspace for full Linked Orders Report.   100 mg 200 mL/hr over 30 Minutes Intravenous Daily 09/29/20 1050 10/04/20 0959   09/29/20 1200  remdesivir 200 mg in sodium chloride 0.9% 250 mL IVPB       See Hyperspace for full Linked Orders Report.   200 mg 580 mL/hr over 30 Minutes Intravenous Once 09/29/20 1050 09/29/20 1328        Subjective/Interval History: Interviewed and examined along with spouse at bedside.  Some dyspnea.  Cough frequency has improved.  Intermittent and dry cough.  No pain.  Eating okay.  Want to know when she will be discharged.   Assessment/Plan:  Acute right foot pain with concern for acute ischemia Patient underwent CT angiogram which showed multiple occlusions.  Patient was taking Eliquis prior to admission for her atrial fibrillation.  She had been compliant with this medication.  Vascular surgery was consulted.  Patient was started on heparin.   Discussions were held with patient regarding any thrombectomy.  Patient was initially reluctant.   Patient reevaluated by Vascular surgery and they found pulses in the right foot using Doppler.  They did not feel that there was any indication for surgical intervention.  Discussed with family members. Patient was started back on Eliquis.  Remained stable.  Outpatient follow-up with vascular surgery. Appears stable.  Pneumonia due to COVID-19 Chest x-ray did raise concern for opacities.  Patient with a dry cough for the past week or so.  She mentions that she has been immunized against COVID-19.  She is on Eliquis so cannot be given Paxlovid.   Patient was started on Remdesivir.  Overnight 8/22, she was placed on 2 L of oxygen by nasal cannula.  Chest x-ray 8/23: Increasing density in the retrocardiac region could represent volume loss or developing pneumonia and also likely interstitial edema.  CRP noted to be elevated.  Was initiated on Solu-Medrol at a lower dose because of her advanced age.  Incentive spirometry.  Oxygen saturations in the low 90s on room air.  Resume furosemide.  Left adnexal fibrothecoma and fibroid Concern was for malignancy.  MRI of the pelvis was done which raises concern for fibroid and fibrothecoma.  These appear to be benign lesions.  Will need for outpatient follow-up with GYN.  No indication for urgent intervention at this time.  Lactic acidosis Likely due to ischemia.  History of paroxysmal atrial fibrillation on chronic anticoagulation Not noted to be on any rate limiting drugs.  Eliquis was resumed.  Chronic diastolic CHF Noted to be hypovolemic yesterday with mildly elevated creatinine.  She was gently hydrated with improvement.  IV fluids discontinued.  Furosemide which was on hold will be resumed.  Essential hypertension/hypokalemia Continue amlodipine.  Potassium normal.  BP controlled.  Normocytic anemia Hemoglobin stable in the 11 g  range.  Dyslipidemia Continue Zetia  DVT Prophylaxis: On Eliquis Code Status: DNR Family Communication: Discussed in detail with patient spouse at bedside. Disposition Plan: Seen by PT and OT.  Home health is recommended at discharge.  Status is: Inpatient  Remains inpatient appropriate because:IV treatments appropriate due to intensity of illness or inability to take PO and Inpatient level of care appropriate due to severity of illness  Dispo: The patient is from: Home              Anticipated d/c is to: Home              Patient currently is not medically stable to d/c.   Difficult to place patient No       Medications: Scheduled:  amLODipine  5 mg Oral Daily   apixaban  5 mg Oral BID   ezetimibe  10 mg Oral Daily   famotidine  20 mg Oral Daily   methylPREDNISolone (SOLU-MEDROL) injection  40 mg Intravenous Q24H   sodium chloride flush  3 mL Intravenous Q12H   Continuous:  remdesivir 100 mg in NS 100 mL 100 mg (10/02/20 1055)   [id number], fentaNYL (SUBLIMAZE) injection, haloperidol lactate, hydrocortisone cream, ondansetron **OR** ondansetron (ZOFRAN) IV   Objective:  Vital Signs  Vitals:   10/01/20 1930 10/02/20 0631 10/02/20 0843 10/02/20 1212  BP: 126/63 124/75 126/74 136/76  Pulse: 88 88  86  Resp: '19 19 18 18  '$ Temp: (!) 97.5 F (36.4 C)  97.6 F (36.4 C) 97.7 F (36.5 C)  TempSrc: Oral Oral Oral Oral  SpO2: 91% 93% 90% 91%  Weight:  77.8 kg    Height:        Intake/Output Summary (Last 24 hours) at 10/02/2020 1713 Last data filed at 10/02/2020 0930 Gross per 24 hour  Intake 240 ml  Output --  Net 240 ml   Filed Weights   09/28/20 0728 10/01/20 0321 10/02/20 0631  Weight: 72.1 kg 77 kg 77.8 kg    General appearance: Elderly female, small built, frail lying propped up in bed. Resp: Appears mildly tachypneic while speaking.  Also reported some orthopnea.  Clear to auscultation anteriorly.  Harsh posteriorly.  No wheezing or rhonchi Cardio:  S1-S2 is normal regular.  No S3-S4.  No rubs murmurs or bruit GI: Abdomen is soft.  Nontender nondistended.  Bowel sounds are present normal.  No masses organomegaly Extremities: No edema.  Neurologic: No focal neurological deficits.      Lab Results:  Data Reviewed: I have personally reviewed following labs and imaging studies  CBC: Recent Labs  Lab 09/28/20 0731 09/29/20 0155 09/30/20 0132 10/01/20 0056 10/02/20 0200  WBC 8.9 7.7 9.1 7.6 5.5  NEUTROABS 7.3  --   --   --   --   HGB 12.7 13.0 12.0 11.3* 11.2*  HCT 41.0 39.9 37.2 35.3* 36.6  MCV 103.0* 99.8 98.9 99.4 102.5*  PLT 225 199 176 148* [id number]    Basic Metabolic Panel: Recent Labs  Lab 09/28/20 0731 09/29/20 0155 09/30/20 0132 10/01/20 0056 10/02/20 0200  NA 140 138 138 138 139  K 3.5 4.9 3.7 3.1* 4.0  CL 104 106  105 104 101  CO2 '25 23 25 25 24  '$ GLUCOSE 169* 137* 132* 116* 162*  BUN '12 15 19 18 18  '$ CREATININE 0.97 0.97 1.16* 0.93 1.01*  CALCIUM 8.5* 8.5* 8.3* 7.9* 8.4*    GFR: Estimated Creatinine Clearance: 32.2 mL/min (A) (by C-G formula based on SCr of 1.01 mg/dL (H)).  Liver Function Tests: Recent Labs  Lab 09/28/20 0731 09/30/20 0132 10/01/20 0056 10/02/20 0200  AST 33 56* 52* 56*  ALT 21 40 45* 49*  ALKPHOS 96 77 68 68  BILITOT 0.6 0.8 0.7 1.0  PROT 6.2* 5.6* 5.1* 5.7*  ALBUMIN 3.5 2.9* 2.8* 3.0*     Recent Results (from the past 240 hour(s))  Resp Panel by RT-PCR (Flu A&B, Covid) Nasopharyngeal Swab     Status: Abnormal   Collection Time: 09/28/20  2:23 PM   Specimen: Nasopharyngeal Swab; Nasopharyngeal(NP) swabs in vial transport medium  Result Value Ref Range Status   SARS Coronavirus 2 by RT PCR POSITIVE (A) NEGATIVE Final    Comment: RESULT CALLED TO, READ BACK BY AND VERIFIED WITH: RN CHRISTINA.C AT [id number] ON 09/28/2020 BY PAW. (NOTE) SARS-CoV-2 target nucleic acids are DETECTED.  The SARS-CoV-2 RNA is generally detectable in upper respiratory specimens during the acute phase  of infection. Positive results are indicative of the presence of the identified virus, but do not rule out bacterial infection or co-infection with other pathogens not detected by the test. Clinical correlation with patient history and other diagnostic information is necessary to determine patient infection status. The expected result is Negative.  Fact Sheet for Patients: EntrepreneurPulse.com.au  Fact Sheet for Healthcare Providers: IncredibleEmployment.be  This test is not yet approved or cleared by the [place] FDA and  has been authorized for detection and/or diagnosis of SARS-CoV-2 by FDA under an Emergency Use Authorization (EUA).  This EUA will remain in effect (meaning this  test can be used) for the duration of  the COVID-19 declaration under Section 564(b)(1) of the Act, 21 U.S.C. section 360bbb-3(b)(1), unless the authorization is terminated or revoked sooner.     Influenza A by PCR NEGATIVE NEGATIVE Final   Influenza B by PCR NEGATIVE NEGATIVE Final    Comment: (NOTE) The Xpert Xpress SARS-CoV-2/FLU/RSV plus assay is intended as an aid in the diagnosis of influenza from Nasopharyngeal swab specimens and should not be used as a sole basis for treatment. Nasal washings and aspirates are unacceptable for Xpert Xpress SARS-CoV-2/FLU/RSV testing.  Fact Sheet for Patients: EntrepreneurPulse.com.au  Fact Sheet for Healthcare Providers: IncredibleEmployment.be  This test is not yet approved or cleared by the [place] FDA and has been authorized for detection and/or diagnosis of SARS-CoV-2 by FDA under an Emergency Use Authorization (EUA). This EUA will remain in effect (meaning this test can be used) for the duration of the COVID-19 declaration under Section 564(b)(1) of the Act, 21 U.S.C. section 360bbb-3(b)(1), unless the authorization is terminated or revoked.  Performed at [facility], [place] [address], [place], [place] [address]        Radiology Studies: DG CHEST PORT 1 VIEW  Result Date: 10/01/2020 CLINICAL DATA:  Pneumonia due to COVID-19, no history of chest pain or hypertension. EXAM: PORTABLE CHEST 1 VIEW COMPARISON:  September 28, 2020. FINDINGS: Heart size remains enlarged.  Hilar structures are stable. Increasing density in the retrocardiac region is noted since the study of September 28, 2020. RIGHT chest is clear. Mild interstitial prominence is unchanged. On limited assessment no acute skeletal  process. IMPRESSION: Increasing density in the retrocardiac region could represent volume loss or developing pneumonia. Stable cardiomegaly and mild interstitial prominence, likely interstitial edema. Electronically Signed   By: Zetta Bills M.D.   On: 10/01/2020 13:41       LOS: 4 days   Vernell Leep, MD, [place], [facility]. [facility]  To contact the attending provider between 7A-7P or the covering provider during after hours 7P-7A, please log into the web site [email or web address] and access using universal [place] password for that web site. If you do not have the password, please call the hospital operator.

## 2020-10-03 ENCOUNTER — Telehealth: Payer: Self-pay

## 2020-10-03 DIAGNOSIS — I5033 Acute on chronic diastolic (congestive) heart failure: Secondary | ICD-10-CM

## 2020-10-03 LAB — COMPREHENSIVE METABOLIC PANEL
ALT: 60 U/L — ABNORMAL HIGH (ref 0–44)
AST: 55 U/L — ABNORMAL HIGH (ref 15–41)
Albumin: 2.9 g/dL — ABNORMAL LOW (ref 3.5–5.0)
Alkaline Phosphatase: 67 U/L (ref 38–126)
Anion gap: 9 (ref 5–15)
BUN: 21 mg/dL (ref 8–23)
CO2: 30 mmol/L (ref 22–32)
Calcium: 8.5 mg/dL — ABNORMAL LOW (ref 8.9–10.3)
Chloride: 102 mmol/L (ref 98–111)
Creatinine, Ser: 1.04 mg/dL — ABNORMAL HIGH (ref 0.44–1.00)
GFR, Estimated: 49 mL/min — ABNORMAL LOW (ref >60)
Glucose, Bld: 144 mg/dL — ABNORMAL HIGH (ref 70–99)
Potassium: 4 mmol/L (ref 3.5–5.1)
Sodium: 141 mmol/L (ref 135–145)
Total Bilirubin: 0.8 mg/dL (ref 0.3–1.2)
Total Protein: 5.5 g/dL — ABNORMAL LOW (ref 6.5–8.1)

## 2020-10-03 LAB — CBC
HCT: 35.4 % — ABNORMAL LOW (ref 36.0–46.0)
Hemoglobin: 11.4 g/dL — ABNORMAL LOW (ref 12.0–15.0)
MCH: 32.1 pg (ref 26.0–34.0)
MCHC: 32.2 g/dL (ref 30.0–36.0)
MCV: 99.7 fL (ref 80.0–100.0)
Platelets: 191 10*3/uL (ref 150–400)
RBC: 3.55 MIL/uL — ABNORMAL LOW (ref 3.87–5.11)
RDW: 15.4 % (ref 11.5–15.5)
WBC: 6.3 10*3/uL (ref 4.0–10.5)
nRBC: 0 % (ref 0.0–0.2)

## 2020-10-03 LAB — C-REACTIVE PROTEIN: CRP: 6 mg/dL — ABNORMAL HIGH (ref <1.0)

## 2020-10-03 MED ORDER — FUROSEMIDE 40 MG PO TABS
40.0000 mg | ORAL_TABLET | Freq: Every day | ORAL | Status: DC
  Administered 2020-10-03 – 2020-10-04 (×2): 40 mg via ORAL
  Filled 2020-10-03 (×2): qty 1

## 2020-10-03 NOTE — Progress Notes (Signed)
[facility] PROGRESS NOTE   Maria [name] [id number] DOB: 1922-03-16 DOA: 09/28/2020  PCP: Maria Gravel, MD  Brief History/Interval Summary: 87 y.o. female with medical history significant of hypertension, hyperlipidemia, paroxysmal atrial fibrillation, anemia, and diastolic CHF last EF 60 to 65% presents with complaints of right leg pain.  Concern was for vascular ischemia.  Seen by vascular surgery.  CT angiogram was done.  Patient was started on heparin infusion.  Hospitalized for further management.  Subsequently found to be positive for COVID-19.  Has been experiencing dry cough for the past week or so.    Patient evaluated daily by vascular surgery.  No indication for surgery at this time.  Patient was started on Remdesivir for COVID-19 pneumonia.  Consultants: Vascular surgery  Procedures: None  Antibiotics: Anti-infectives (From admission, onward)    Start     Dose/Rate Route Frequency Ordered Stop   09/30/20 1000  remdesivir 100 mg in sodium chloride 0.9 % 100 mL IVPB       See Hyperspace for full Linked Orders Report.   100 mg 200 mL/hr over 30 Minutes Intravenous Daily 09/29/20 1050 10/03/20 1025   09/29/20 1200  remdesivir 200 mg in sodium chloride 0.9% 250 mL IVPB       See Hyperspace for full Linked Orders Report.   200 mg 580 mL/hr over 30 Minutes Intravenous Once 09/29/20 1050 09/29/20 1328        Subjective/Interval History: Feels better.  Breathing improved.  States that her cough has resolved.  This was confirmed by her spouse at bedside.  Working with OT this morning.  Oxygen saturations in the mid 90s on room air.  Assessment/Plan:  Acute right foot pain with concern for acute ischemia Patient underwent CT angiogram which showed multiple occlusions.  Patient was taking Eliquis prior to admission for her atrial fibrillation.  She had been compliant with this medication.  Vascular surgery was consulted.  Patient was started on heparin.   Discussions were held with patient regarding any thrombectomy.  Patient was initially reluctant.   Patient reevaluated by Vascular surgery and they found pulses in the right foot using Doppler.  They did not feel that there was any indication for surgical intervention.  Discussed with family members. Patient was started back on Eliquis.  Remained stable.  Outpatient follow-up with vascular surgery. Appears stable.  Pneumonia due to COVID-19 Chest x-ray did raise concern for opacities.  Patient with a dry cough for the past week or so.  She mentions that she has been immunized against COVID-19.  She is on Eliquis so cannot be given Paxlovid.   Patient was started on Remdesivir.  Overnight 8/22, she was placed on 2 L of oxygen by nasal cannula.  Chest x-ray 8/23: Increasing density in the retrocardiac region could represent volume loss or developing pneumonia and also likely interstitial edema.  CRP noted to be elevated.  Was initiated on Solu-Medrol at a lower dose because of her advanced age.  Incentive spirometry.   S/p IV Lasix 40 mg x 1 last night with good effect.  Resume home Lasix at lower dose 40 mg orally daily. Hypoxia resolved.  Completed 5 days of remdesivir.  Completed 3 days of IV Solu-Medrol-we will discontinue since hypoxia resolved and also may be contributing to her mental status changes at night.  Left adnexal fibrothecoma and fibroid Concern was for malignancy.  MRI of the pelvis was done which raises concern for fibroid and fibrothecoma.  These appear to be  benign lesions.  Will need for outpatient follow-up with GYN.  No indication for urgent intervention at this time.  Lactic acidosis Likely due to ischemia.  History of paroxysmal atrial fibrillation on chronic anticoagulation Not noted to be on any rate limiting drugs.  Eliquis was resumed.  Acute on chronic diastolic CHF Noted to be hypovolemic yesterday with mildly elevated creatinine.  She was gently hydrated with  improvement.  IV fluids discontinued.  Lasix as discussed above.  Essential hypertension/hypokalemia Continue amlodipine.  Potassium normal.  BP controlled.  Normocytic anemia Hemoglobin stable in the 11 g range.  Dyslipidemia Continue Zetia  Alzheimer's dementia with sundowning Getting very small doses of as needed Haldol.  Delirium precautions.  DC steroids.  DVT Prophylaxis: On Eliquis Code Status: DNR Family Communication: Discussed in detail with patient spouse at bedside today.  Subsequently discussed with patient's granddaughter via phone in detail, updated care and answered all questions. Disposition Plan: Seen by PT and OT.  Home health is recommended at discharge.  Status is: Inpatient  Remains inpatient appropriate because:IV treatments appropriate due to intensity of illness or inability to take PO and Inpatient level of care appropriate due to severity of illness  Dispo: The patient is from: Home              Anticipated d/c is to: Home              Patient currently is not medically stable to d/c.   Difficult to place patient No       Medications: Scheduled:  amLODipine  5 mg Oral Daily   apixaban  5 mg Oral BID   ezetimibe  10 mg Oral Daily   famotidine  20 mg Oral Daily   furosemide  40 mg Oral Daily   methylPREDNISolone (SOLU-MEDROL) injection  40 mg Intravenous Q24H   sodium chloride flush  3 mL Intravenous Q12H   Continuous:   [id number], fentaNYL (SUBLIMAZE) injection, haloperidol lactate, hydrocortisone cream, ondansetron **OR** ondansetron (ZOFRAN) IV   Objective:  Vital Signs  Vitals:   10/03/20 0446 10/03/20 1000 10/03/20 1254 10/03/20 1334  BP:  111/62 (!) 114/59 127/64  Pulse:  80 86 85  Resp:  '20 19 20  '$ Temp:  (!) 97.5 F (36.4 C) 97.7 F (36.5 C) (!) 97.5 F (36.4 C)  TempSrc:  Oral Oral Oral  SpO2:  95% 95% 92%  Weight: 75.4 kg     Height:        Intake/Output Summary (Last 24 hours) at 10/03/2020 1642 Last data filed  at 10/03/2020 0055 Gross per 24 hour  Intake 240 ml  Output 1100 ml  Net -860 ml   Filed Weights   10/01/20 0321 10/02/20 0631 10/03/20 0446  Weight: 77 kg 77.8 kg 75.4 kg    General appearance: Elderly female, small built, frail sitting up comfortably at edge of bed attempting to work with OT. Resp: Looks much improved.  Not tachypneic while speaking.  Clear to auscultation except occasional basal crackles.  No wheezing or rhonchi.  No increased work of breathing.   Cardio: S1-S2 is normal regular.  No S3-S4.  No rubs murmurs or bruit.  No pedal edema. GI: Abdomen is soft.  Nontender nondistended.  Bowel sounds are present normal.  No masses organomegaly Extremities: No edema.  Neurologic: No focal neurological deficits.  Alert and oriented x2.     Lab Results:  Data Reviewed: I have personally reviewed following labs and imaging studies  CBC: Recent Labs  Lab 09/28/20 0731 09/29/20 0155 09/30/20 0132 10/01/20 0056 10/02/20 0200 10/03/20 0150  WBC 8.9 7.7 9.1 7.6 5.5 6.3  NEUTROABS 7.3  --   --   --   --   --   HGB 12.7 13.0 12.0 11.3* 11.2* 11.4*  HCT 41.0 39.9 37.2 35.3* 36.6 35.4*  MCV 103.0* 99.8 98.9 99.4 102.5* 99.7  PLT 225 199 176 148* 174 [id number]    Basic Metabolic Panel: Recent Labs  Lab 09/29/20 0155 09/30/20 0132 10/01/20 0056 10/02/20 0200 10/03/20 0150  NA 138 138 138 139 141  K 4.9 3.7 3.1* 4.0 4.0  CL 106 105 104 101 102  CO2 '23 25 25 24 30  '$ GLUCOSE 137* 132* 116* 162* 144*  BUN '15 19 18 18 21  '$ CREATININE 0.97 1.16* 0.93 1.01* 1.04*  CALCIUM 8.5* 8.3* 7.9* 8.4* 8.5*    GFR: Estimated Creatinine Clearance: 30.8 mL/min (A) (by C-G formula based on SCr of 1.04 mg/dL (H)).  Liver Function Tests: Recent Labs  Lab 09/28/20 0731 09/30/20 0132 10/01/20 0056 10/02/20 0200 10/03/20 0150  AST 33 56* 52* 56* 55*  ALT 21 40 45* 49* 60*  ALKPHOS 96 77 68 68 67  BILITOT 0.6 0.8 0.7 1.0 0.8  PROT 6.2* 5.6* 5.1* 5.7* 5.5*  ALBUMIN 3.5 2.9* 2.8*  3.0* 2.9*     Recent Results (from the past 240 hour(s))  Resp Panel by RT-PCR (Flu A&B, Covid) Nasopharyngeal Swab     Status: Abnormal   Collection Time: 09/28/20  2:23 PM   Specimen: Nasopharyngeal Swab; Nasopharyngeal(NP) swabs in vial transport medium  Result Value Ref Range Status   SARS Coronavirus 2 by RT PCR POSITIVE (A) NEGATIVE Final    Comment: RESULT CALLED TO, READ BACK BY AND VERIFIED WITH: RN CHRISTINA.C AT [id number] ON 09/28/2020 BY PAW. (NOTE) SARS-CoV-2 target nucleic acids are DETECTED.  The SARS-CoV-2 RNA is generally detectable in upper respiratory specimens during the acute phase of infection. Positive results are indicative of the presence of the identified virus, but do not rule out bacterial infection or co-infection with other pathogens not detected by the test. Clinical correlation with patient history and other diagnostic information is necessary to determine patient infection status. The expected result is Negative.  Fact Sheet for Patients: EntrepreneurPulse.com.au  Fact Sheet for Healthcare Providers: IncredibleEmployment.be  This test is not yet approved or cleared by the [place] FDA and  has been authorized for detection and/or diagnosis of SARS-CoV-2 by FDA under an Emergency Use Authorization (EUA).  This EUA will remain in effect (meaning this  test can be used) for the duration of  the COVID-19 declaration under Section 564(b)(1) of the Act, 21 U.S.C. section 360bbb-3(b)(1), unless the authorization is terminated or revoked sooner.     Influenza A by PCR NEGATIVE NEGATIVE Final   Influenza B by PCR NEGATIVE NEGATIVE Final    Comment: (NOTE) The Xpert Xpress SARS-CoV-2/FLU/RSV plus assay is intended as an aid in the diagnosis of influenza from Nasopharyngeal swab specimens and should not be used as a sole basis for treatment. Nasal washings and aspirates are unacceptable for Xpert Xpress  SARS-CoV-2/FLU/RSV testing.  Fact Sheet for Patients: EntrepreneurPulse.com.au  Fact Sheet for Healthcare Providers: IncredibleEmployment.be  This test is not yet approved or cleared by the [place] FDA and has been authorized for detection and/or diagnosis of SARS-CoV-2 by FDA under an Emergency Use Authorization (EUA). This EUA will remain in effect (meaning this test can be used) for the duration  of the COVID-19 declaration under Section 564(b)(1) of the Act, 21 U.S.C. section 360bbb-3(b)(1), unless the authorization is terminated or revoked.  Performed at [facility], [place] [address], [place], [place] [address]        Radiology Studies: No results found.     LOS: 5 days   Vernell Leep, MD, [place], [facility]. [facility]  To contact the attending provider between 7A-7P or the covering provider during after hours 7P-7A, please log into the web site [email or web address] and access using universal [place] password for that web site. If you do not have the password, please call the hospital operator.

## 2020-10-03 NOTE — Progress Notes (Signed)
Physical Therapy Treatment Patient Details Name: Maria [name] MRN: [id number] DOB: November 21, 1922 Today's Date: 10/03/2020    History of Present Illness Maria [name] is a 87 y.o. female with medical history significant of hypertension, hyperlipidemia, paroxysmal atrial fibrillation, anemia, and diastolic CHF last EF 60 to 65% presents with complaints of right leg pain that started possibly around 4:30 AM this. Patient COVID +    PT Comments    Performed short bouts of gt training in room.  Pt remains unsteady but able to mobilize with min assistance.  Plan for stair training next session.  Will bring cane to simulate "walking stick" she uses at home.     Follow Up Recommendations  Home health PT;Supervision for mobility/OOB     Equipment Recommendations  None recommended by PT    Recommendations for Other Services       Precautions / Restrictions Precautions Precautions: Fall Restrictions Weight Bearing Restrictions: No    Mobility  Bed Mobility                    Transfers Overall transfer level: Needs assistance Equipment used: None Transfers: Sit to/from Stand Sit to Stand: Min guard         General transfer comment: Cues for hand placement to and from seated surface this session.  Ambulation/Gait Ambulation/Gait assistance: Min assist Gait Distance (Feet): 40 Feet (+ 10 ft) Assistive device: IV Pole;None (intermittent use of IV pole this session.) Gait Pattern/deviations: Step-through pattern;Decreased step length - right;Decreased step length - left;Drifts right/left;Trunk flexed Gait velocity: decr   General Gait Details: used IV pole to simulate walking stick that she uses at home.  Minor instability noted but no true LOB.  Min assistance to balance and steady   Marine scientist [place] (Stroke Patients Only)       Balance Overall balance assessment: Needs assistance Sitting-balance  support: Feet supported Sitting balance-Leahy Scale: Good       Standing balance-Leahy Scale: Fair Standing balance comment: requires assistance for safety, B UE support                            Cognition Arousal/Alertness: Awake/alert Behavior During Therapy: WFL for tasks assessed/performed Overall Cognitive Status: Within Functional Limits for tasks assessed                                        Exercises      General Comments        Pertinent Vitals/Pain Pain Assessment: No/denies pain    Home Living                      Prior Function            PT Goals (current goals can now be found in the care plan section) Acute Rehab PT Goals Patient Stated Goal: to go home Potential to Achieve Goals: Good Progress towards PT goals: Progressing toward goals    Frequency    Min 3X/week      PT Plan Current plan remains appropriate    Co-evaluation              AM-PAC PT "6 Clicks" Mobility   Outcome Measure  Help needed turning from your back  to your side while in a flat bed without using bedrails?: A Little Help needed moving from lying on your back to sitting on the side of a flat bed without using bedrails?: A Little Help needed moving to and from a bed to a chair (including a wheelchair)?: A Little Help needed standing up from a chair using your arms (e.g., wheelchair or bedside chair)?: A Little Help needed to walk in hospital room?: A Little Help needed climbing 3-5 steps with a railing? : A Little 6 Click Score: 18    End of Session Equipment Utilized During Treatment: Gait belt Activity Tolerance: Patient limited by fatigue Patient left: in bed;with call bell/phone within reach;with bed alarm set;with family/visitor present Nurse Communication: Mobility status PT Visit Diagnosis: Unsteadiness on feet (R26.81);History of falling (Z91.81);Difficulty in walking, not elsewhere classified (R26.2);Muscle  weakness (generalized) (M62.81)     Time: [id number] PT Time Calculation (min) (ACUTE ONLY): 25 min  Charges:  $Gait Training: 8-22 mins $Therapeutic Activity: 8-22 mins                     Erasmo Leventhal , PTA Acute Rehabilitation Services Pager [phone] Office [phone]    Deyjah Kindel Eli Hose 10/03/2020, 2:43 PM

## 2020-10-03 NOTE — Telephone Encounter (Signed)
Patient's granddaughter calls today to ask about travelling 670 + miles in a car in October for a move to [place]. They have had an offer on their house and would need to make that determination today. Patient is currently hospitalized and [facility] was consulted for possible CLI + PAD. Discussed with Dr. Scot Dock - patient is okay to travel - stop every few hours to get out and move. Granddaughter verbalized understanding.

## 2020-10-03 NOTE — Progress Notes (Signed)
Occupational Therapy Treatment Patient Details Name: Maria [name] MRN: [id number] DOB: 06/22/1922 Today's Date: 10/03/2020    History of present illness CHARLETTE WARMUTH is a 87 y.o. female with medical history significant of hypertension, hyperlipidemia, paroxysmal atrial fibrillation, anemia, and diastolic CHF last EF 60 to 65% presents with complaints of right leg pain that started possibly around 4:30 AM this. Patient COVID +   OT comments  Pt presented with husband Ed in the room and agreeable to therapy. Pt in session reported state, city and month correctly but noted decrease in ability to sequence, problem solve and judgment in session as noted when setting pt up for oral car while standing they attempted to use their straw as a faucet to place water on toothbrush. Pt in session on room air at all times with 02 in 90s% and HR 80-100. Pt requires supervision to min guard with ambulation using RW as using the walker is a newer tasks as normally thy use a walking stick in the home. Pt's husband lives with her and the home is on the grounds of the rest of the family but in report they may be moving soon. Pt currently with functional limitations due to the deficits listed below (see OT Problem List).  Pt will benefit from skilled OT to increase their safety and independence with ADL and functional mobility for ADL to facilitate discharge to venue listed below.    Follow Up Recommendations  Home health OT;Supervision/Assistance - 24 hour    Equipment Recommendations  None recommended by OT    Recommendations for Other Services      Precautions / Restrictions Precautions Precautions: Fall;Other (comment) (watch o2 status) Restrictions Weight Bearing Restrictions: No       Mobility Bed Mobility Overal bed mobility: Modified Independent             General bed mobility comments: HOB elevated, increased time    Transfers Overall transfer level: Needs  assistance Equipment used: Rolling walker (2 wheeled) Transfers: Sit to/from Stand Sit to Stand: Min guard         General transfer comment: pt needs repeated instructions on how to sequence task    Balance Overall balance assessment: Needs assistance;History of Falls Sitting-balance support: Feet supported Sitting balance-Leahy Scale: Good     Standing balance support: Bilateral upper extremity supported;During functional activity Standing balance-Leahy Scale: Fair                             ADL either performed or assessed with clinical judgement   ADL Overall ADL's : Needs assistance/impaired Eating/Feeding: Independent;Cueing for safety;Cueing for sequencing;Sitting   Grooming: Oral care;Set up;Cueing for safety;Cueing for sequencing;Standing   Upper Body Bathing: Set up;Cueing for sequencing;Sitting   Lower Body Bathing: Minimal assistance;Cueing for safety;Cueing for sequencing;Sit to/from stand   Upper Body Dressing : Set up;Cueing for safety;Cueing for sequencing;Sitting   Lower Body Dressing: Minimal assistance;Cueing for sequencing;Cueing for safety;Sit to/from stand   Toilet Transfer: Min guard;Cueing for safety;Cueing for sequencing;Regular Toilet;Grab bars   Toileting- Water quality scientist and Hygiene: Min guard;Cueing for safety;Cueing for sequencing;Sit to/from stand       Functional mobility during ADLs: Min guard;Cueing for safety;Cueing for sequencing;Rolling walker       Vision       Perception     Praxis      Cognition Arousal/Alertness: Awake/alert Behavior During Therapy: WFL for tasks assessed/performed Overall Cognitive Status: Within Functional Limits for  tasks assessed                                          Exercises     Shoulder Instructions       General Comments      Pertinent Vitals/ Pain       Pain Assessment: No/denies pain  Home Living                                           Prior Functioning/Environment              Frequency  Min 2X/week        Progress Toward Goals  OT Goals(current goals can now be found in the care plan section)  Progress towards OT goals: Progressing toward goals  Acute Rehab OT Goals Patient Stated Goal: to go home OT Goal Formulation: With patient Time For Goal Achievement: 10/12/20 Potential to Achieve Goals: Good ADL Goals Pt Will Perform Lower Body Dressing: with supervision;sit to/from stand Pt Will Transfer to Toilet: with set-up;ambulating;grab bars Pt Will Perform Tub/Shower Transfer: with set-up;shower seat;ambulating;grab bars;rolling walker  Plan Discharge plan remains appropriate    Co-evaluation                 AM-PAC OT "6 Clicks" Daily Activity     Outcome Measure   Help from another person eating meals?: None Help from another person taking care of personal grooming?: A Little Help from another person toileting, which includes using toliet, bedpan, or urinal?: A Little Help from another person bathing (including washing, rinsing, drying)?: A Little Help from another person to put on and taking off regular upper body clothing?: A Little Help from another person to put on and taking off regular lower body clothing?: A Little 6 Click Score: 19    End of Session Equipment Utilized During Treatment: Gait belt;Rolling walker  OT Visit Diagnosis: Unsteadiness on feet (R26.81);Other abnormalities of gait and mobility (R26.89);History of falling (Z91.81)   Activity Tolerance Patient tolerated treatment well   Patient Left in chair;with chair alarm set;with call bell/phone within reach;with family/visitor present   Nurse Communication Mobility status;Other (comment)        Time: [id number] OT Time Calculation (min): 47 min  Charges: OT General Charges $OT Visit: 1 Visit OT Treatments $Self Care/Home Management : 38-52 mins  Joeseph Amor OTR/L  Acute Rehab Services   [phone] office number [phone] pager number    Joeseph Amor 10/03/2020, 10:58 AM

## 2020-10-04 LAB — COMPREHENSIVE METABOLIC PANEL
ALT: 63 U/L — ABNORMAL HIGH (ref 0–44)
AST: 52 U/L — ABNORMAL HIGH (ref 15–41)
Albumin: 3.1 g/dL — ABNORMAL LOW (ref 3.5–5.0)
Alkaline Phosphatase: 74 U/L (ref 38–126)
Anion gap: 9 (ref 5–15)
BUN: 21 mg/dL (ref 8–23)
CO2: 29 mmol/L (ref 22–32)
Calcium: 8.3 mg/dL — ABNORMAL LOW (ref 8.9–10.3)
Chloride: 100 mmol/L (ref 98–111)
Creatinine, Ser: 1.03 mg/dL — ABNORMAL HIGH (ref 0.44–1.00)
GFR, Estimated: 50 mL/min — ABNORMAL LOW (ref >60)
Glucose, Bld: 151 mg/dL — ABNORMAL HIGH (ref 70–99)
Potassium: 3.8 mmol/L (ref 3.5–5.1)
Sodium: 138 mmol/L (ref 135–145)
Total Bilirubin: 0.9 mg/dL (ref 0.3–1.2)
Total Protein: 5.6 g/dL — ABNORMAL LOW (ref 6.5–8.1)

## 2020-10-04 LAB — C-REACTIVE PROTEIN: CRP: 3.8 mg/dL — ABNORMAL HIGH (ref <1.0)

## 2020-10-04 MED ORDER — GUAIFENESIN-DM 100-10 MG/5ML PO SYRP: 10.0000 mL | ORAL_SOLUTION | ORAL | 0 refills | Status: DC | PRN

## 2020-10-04 MED ORDER — GUAIFENESIN-DM 100-10 MG/5ML PO SYRP
10.0000 mL | ORAL_SOLUTION | ORAL | Status: DC | PRN
  Administered 2020-10-04: 10 mL via ORAL
  Filled 2020-10-04: qty 10

## 2020-10-04 NOTE — TOC Transition Note (Signed)
Transition of Care [facility]) - CM/SW Discharge Note   Patient Details  Name: Maria [name] MRN: [id number] Date of Birth: 11/05/1922  Transition of Care [facility] CM/SW Contact:  Sharin Mons, RN Phone Number: 10/04/2020, 4:20 PM   Clinical Narrative:    Patient will DC [id number] Anticipated DC date: 10/04/2020 Family notified: yes, son Transport by: car       Pt with acute right foot pain with concern for acute ischemia. Per MD patient ready for DC today. RN, patient,and  patient's son aware of DC.Pt agreeable to home health services, preferably, [facility]. States has used [place] in the past. Referral made with Alvis Lemmings and accepted . Pt without DME needs.   Pt without Rx med concerns.  Post hospital f/u noted on AVS.  Son to provide transportation ot home.  RNCM will sign off for now as intervention is no longer needed. Please consult [place] again if new needs arise.   Final next level of care: Home w Home Health Services Barriers to Discharge: No Barriers Identified   Patient Goals and CMS Choice     Choice offered to / list presented to : Patient, Adult Children  Discharge Placement                       Discharge Plan and Services   Discharge Planning Services: CM Consult Post Acute Care Choice: Home Health                    HH Arranged: PT, OT [facility] Agency: [place] Date [place]: 10/04/20 Time [place]: [phone] Representative spoke with at [place]: [place] ([place]) Interventions     Readmission Risk Interventions No flowsheet data found.

## 2020-10-04 NOTE — Progress Notes (Signed)
Nutrition Brief Note  Patient identified on the Malnutrition Screening Tool (MST) Report  Wt Readings from Last 15 Encounters:  10/04/20 75.1 kg  08/15/20 72.1 kg  05/01/20 74.4 kg  05/02/19 77.8 kg  07/11/18 72.6 kg  07/05/18 77.1 kg  07/01/18 77.1 kg  06/15/18 72.6 kg  06/14/18 72.6 kg  01/03/18 78 kg  12/20/17 80.1 kg  07/30/17 78.2 kg  06/08/17 79.7 kg  05/06/17 79.8 kg  03/17/17 79.5 kg   Maria [name] is a 87 y.o. female with medical history significant of hypertension, hyperlipidemia, paroxysmal atrial fibrillation, anemia, and diastolic CHF last EF 60 to 65% presents with complaints of right leg pain that started possibly around 4:30 AM this.  History is limited somewhat due to the patient being hard of hearing. Patient is able to ambulate with use of a cane.  She had gotten up this morning to take a shower and reported right foot pain that traveled up her leg.  She has been on Eliquis without missing doses, and reports last taking it yesterday night around 8 PM.  Otherwise patient had been in her normal state of than issues.  Denies having any chest pain, shortness of breath, abdominal pain, nausea, vomiting, or diarrhea symptoms.  Pt admitted with acute arterial occlusion and lt adnexal mass.   Reviewed I/O's: +420 ml x 24 hours and +3.9 L since admission  Spoke with pt and husband at bedside. Pt reports good appetite since admission. She has been consuming most of her meals since hospitalized.   PTA pt reports her appetite was "too good". She typically consumes 3 meals meals per day (breakfast: cereal, eggs, toast, and coffee; Lunch: peanut butter and jelly sandwich; Dinner: spaghetti, sweet potatoes, and dessert).   Pt denies any weight loss. Reviewed wt hx; wt has been stable over the past 5 months.   Nutrition-Focused physical exam completed. Findings are no fat depletion, mild muscle depletion, and mild edema.    Medications reviewed.   Labs reviewed.    Current diet order is Heart Healthy, patient is consuming approximately 50-100% of meals at this time. Labs and medications reviewed.   No nutrition interventions warranted at this time. If nutrition issues arise, please consult RD.   Loistine Chance, RD, LDN, [place] Registered Dietitian II Certified Diabetes Care and Education Specialist Please refer to [facility] for RD and/or RD on-call/weekend/after hours pager

## 2020-10-04 NOTE — Discharge Instructions (Addendum)

## 2020-10-04 NOTE — Progress Notes (Signed)
Physical Therapy Treatment Patient Details Name: Maria [name] MRN: [id number] DOB: 10-24-1922 Today's Date: 10/04/2020    History of Present Illness Maria [name] is a 87 y.o. female with medical history significant of hypertension, hyperlipidemia, paroxysmal atrial fibrillation, anemia, and diastolic CHF last EF 60 to 65% presents with complaints of right leg pain that started possibly around 4:30 AM this. Patient COVID +    PT Comments    Pt supine in bed focused on gt training with SPC and stair training to prepare for return home.  Pt tolerated session well.  She was able to put on her socks and pants independently.   Follow Up Recommendations  Home health PT;Supervision for mobility/OOB     Equipment Recommendations  None recommended by PT    Recommendations for Other Services       Precautions / Restrictions Precautions Precautions: Fall Restrictions Weight Bearing Restrictions: No    Mobility  Bed Mobility Overal bed mobility: Modified Independent             General bed mobility comments: HOB elevated, increased time    Transfers Overall transfer level: Needs assistance Equipment used: None Transfers: Sit to/from Stand Sit to Stand: Min guard         General transfer comment: Cues for hand placement to and from seated surface this session.  Ambulation/Gait Ambulation/Gait assistance: Min assist Gait Distance (Feet): 45 Feet Assistive device: Straight cane Gait Pattern/deviations: Step-through pattern;Decreased step length - right;Decreased step length - left;Drifts right/left;Trunk flexed Gait velocity: decr   General Gait Details: Pt able to perform short bout of gt training in room.  She presents with several balance checks but able to correct with use of SPC.  Noted to reach for couter support when able with opposite hand.   Stairs Stairs: Yes Stairs assistance: Min guard Stair Management: Two rails;Forwards Number of Stairs:  4 General stair comments: Cues for sequencing and safety.  No LOB.   Wheelchair Mobility    Modified Rankin (Stroke Patients Only)       Balance Overall balance assessment: Needs assistance Sitting-balance support: Feet supported Sitting balance-Leahy Scale: Good       Standing balance-Leahy Scale: Fair                              Cognition Arousal/Alertness: Awake/alert Behavior During Therapy: WFL for tasks assessed/performed Overall Cognitive Status: Within Functional Limits for tasks assessed                                        Exercises      General Comments        Pertinent Vitals/Pain      Home Living                      Prior Function            PT Goals (current goals can now be found in the care plan section) Acute Rehab PT Goals Patient Stated Goal: to go home Potential to Achieve Goals: Good Progress towards PT goals: Progressing toward goals    Frequency    Min 3X/week      PT Plan Current plan remains appropriate    Co-evaluation              AM-PAC PT "6 Clicks" Mobility  Outcome Measure  Help needed turning from your back to your side while in a flat bed without using bedrails?: A Little Help needed moving from lying on your back to sitting on the side of a flat bed without using bedrails?: A Little Help needed moving to and from a bed to a chair (including a wheelchair)?: A Little Help needed standing up from a chair using your arms (e.g., wheelchair or bedside chair)?: A Little Help needed to walk in hospital room?: A Little Help needed climbing 3-5 steps with a railing? : A Little 6 Click Score: 18    End of Session Equipment Utilized During Treatment: Gait belt Activity Tolerance: Patient limited by fatigue Patient left: with call bell/phone within reach;with family/visitor present;in chair;with chair alarm set Nurse Communication: Mobility status PT Visit Diagnosis:  Unsteadiness on feet (R26.81);History of falling (Z91.81);Difficulty in walking, not elsewhere classified (R26.2);Muscle weakness (generalized) (M62.81)     Time: 1239-1300 PT Time Calculation (min) (ACUTE ONLY): 21 min  Charges:  $Gait Training: 8-22 mins                     Erasmo Leventhal , PTA Acute Rehabilitation Services Pager [phone] Office [place] Tony Friscia 10/04/2020, 1:32 PM

## 2020-10-04 NOTE — Discharge Summary (Signed)
Physician Discharge Summary  KACELYN FIEST [id number] DOB: March 18, 1922  PCP: Jani Gravel, MD  Admitted from: Home Discharged to: Home  Admit date: 09/28/2020 Discharge date: 10/04/2020  Recommendations for Outpatient Follow-up:    Follow-up Information     [facility] Follow up.   Specialty: Home Health Services Contact information: [place] [address] [phone]         [facility] Follow up in 6 week(s).   Why: The office will call the patient with an appointment Contact information: [place]        Jani Gravel, MD. Schedule an appointment as soon as possible for a visit in 1 week(s).   Specialty: Internal Medicine Why: To be seen with repeat labs (CBC & CMP). Contact information: [address] [place] [address] [phone]         Lorretta Harp, MD. Schedule an appointment as soon as possible for a visit.   Specialties: Cardiology, Radiology Contact information: [address] [place] [address] [place] Orders (From admission, onward)     Start     Ordered   10/04/20 [place]  At discharge       Question Answer Comment  To provide the following care/treatments PT   To provide the following care/treatments OT      10/04/20 1144   10/01/20 [place]  At discharge       Question Answer Comment  To provide the following care/treatments OT   To provide the following care/treatments PT      10/01/20 1436             Equipment/Devices: None    Discharge Condition: Improved and stable.   Code Status: DNR Diet recommendation:  Discharge Diet Orders (From admission, onward)     Start     Ordered   10/04/20 0000  Diet - low sodium heart healthy        10/04/20 1143             Discharge Diagnoses:   Principal Problem:   Arterial occlusion, lower extremity ([place]) Active Problems:   Dyslipidemia   Essential hypertension   Paroxysmal atrial fibrillation (HCC)   Chronic diastolic CHF (congestive heart failure) (HCC)   Adnexal mass   Brief Summary: 87 y.o. female with medical history significant of hypertension, hyperlipidemia, paroxysmal atrial fibrillation, anemia, and diastolic CHF last EF 60 to 65% presents with complaints of right leg pain.  Concern was for vascular ischemia.  Seen by vascular surgery.  CT angiogram was done.  Patient was started on heparin infusion.  Hospitalized for further management.  Subsequently found to be positive for COVID-19.  Has been experiencing dry cough for the past week or so.     Patient evaluated daily by vascular surgery.  No indication for surgery at this time.  Patient was started on Remdesivir for COVID-19 pneumonia.  Assessment/Plan:   Acute right foot pain with concern for acute ischemia Patient underwent CT angiogram which showed multiple occlusions.  Patient was taking Eliquis prior to admission for her atrial fibrillation.  She had been compliant with this medication.  Vascular surgery was consulted.  Patient was started on heparin.  Discussions were held with patient regarding any thrombectomy.  Patient  was initially reluctant.   Patient reevaluated by Vascular surgery and they found pulses in the right foot using Doppler.  They did not feel that there was any indication for surgical intervention.  Discussed with family members. Patient was started back on Eliquis.  Remained stable.  Outpatient follow-up with vascular surgery. Appears stable and has not complained of foot pain during the course of my caring for her.   Pneumonia due to COVID-19 with transient acute respiratory failure with hypoxia Chest x-ray did raise concern for opacities.  Patient with a dry cough for the past week or so.  She mentions that she has been immunized against  COVID-19.  She is on Eliquis so cannot be given Paxlovid.   Patient was started on Remdesivir.  Overnight 8/22, she was placed on 2 L of oxygen by nasal cannula.  Chest x-ray 8/23: Increasing density in the retrocardiac region could represent volume loss or developing pneumonia and also likely interstitial edema.  CRP noted to be elevated.  Was initiated on Solu-Medrol at a lower dose because of her advanced age.  Incentive spirometry.   S/p IV Lasix 40 mg x 1 couple nights ago with good effect.  Resumed home Lasix at lower dose 40 mg orally daily. Hypoxia resolved.  Completed 5 days of remdesivir.  Completed 3 days of IV Solu-Medrol-discontinued since hypoxia resolved and also may be contributing to her mental status changes at night. Clinically improved.  Not hypoxic at rest.  No dyspnea reported.  Has some intermittent dry cough which may take a few days to completely resolve.  Supportive treatment for same.  Will have nursing evaluate need for home oxygen/desaturation protocol. Patient's SARS coronavirus 2 PCR was positive on 09/28/2020.  Thereby she should be on home isolation/quarantine for 10 days since that date and should be able to come off isolation on 8/31.  This was discussed with patient spouse and daughter.   Left adnexal fibrothecoma and fibroid Concern was for malignancy.  MRI of the pelvis was done which raises concern for fibroid and fibrothecoma.  These appear to be benign lesions.  Will need for outpatient follow-up with GYN.  No indication for urgent intervention at this time.   Lactic acidosis Likely due to ischemia.   History of paroxysmal atrial fibrillation on chronic anticoagulation Not noted to be on any rate limiting drugs.  Eliquis was resumed.  Acute on chronic diastolic CHF Noted to be hypovolemic yesterday with mildly elevated creatinine.  She was gently hydrated with improvement.  IV fluids discontinued.  Lasix as discussed above.  Now clinically euvolemic and  compensated.  Continue prior home dose of Lasix at discharge.  Essential hypertension/hypokalemia Continue amlodipine.  Potassium normal.  BP controlled.   Normocytic anemia Hemoglobin stable in the 11 g range.  Dyslipidemia Continue Zetia  LFT abnormalities, mild Very mild transaminitis.  Unclear etiology.  Stable.  Follow CMP as outpatient closely.   Alzheimer's dementia with sundowning Getting very small doses of as needed Haldol.  Delirium precautions.  DC steroids.  Has not received any Haldol in over 24 hours.  Appears coherent and back to baseline  Consultations: Vascular surgery  Procedures: None   Discharge Instructions  Discharge Instructions     Call MD for:   Complete by: As directed    Recurrent confusion, agitation or mental status changes.   Call MD for:  difficulty breathing, headache or visual disturbances   Complete by: As directed    Call MD for:  extreme fatigue  Complete by: As directed    Call MD for:  persistant dizziness or light-headedness   Complete by: As directed    Call MD for:  persistant nausea and vomiting   Complete by: As directed    Call MD for:  severe uncontrolled pain   Complete by: As directed    Call MD for:  temperature >100.4   Complete by: As directed    Diet - low sodium heart healthy   Complete by: As directed    Increase activity slowly   Complete by: As directed    No wound care   Complete by: As directed         Medication List     TAKE these medications    amLODipine 5 MG tablet Commonly known as: NORVASC Take 1 tablet by mouth once daily   Eliquis 5 MG Tabs tablet Generic drug: apixaban Take 1 tablet by mouth twice daily What changed: how much to take   ezetimibe 10 MG tablet Commonly known as: ZETIA Take 10 mg by mouth daily.   ferrous sulfate 325 (65 FE) MG tablet Take 1 tablet (325 mg total) by mouth 2 (two) times daily after a meal.   furosemide 80 MG tablet Commonly known as: LASIX TAKE  1 TABLET BY MOUTH ONCE DAILY(SCHEDULE APPT FOR FUTURE REFILLS) What changed: See the new instructions.   guaiFENesin-dextromethorphan 100-10 MG/5ML syrup Commonly known as: ROBITUSSIN DM Take 10 mLs by mouth every 4 (four) hours as needed for cough.   hydrocortisone cream 1 % Apply 1 application topically 2 (two) times daily as needed (for itching- apply to affected areas).   Pfizer-BioNTech COVID-19 Vacc 30 MCG/0.3ML injection Generic drug: COVID-19 mRNA vaccine (Pfizer) USE AS DIRECTED   PRESERVISION AREDS PO Take 1 tablet by mouth 2 (two) times daily.   VITAMIN B-12 PO Take 1 tablet by mouth daily with breakfast.       Allergies  Allergen Reactions   Codeine Nausea And Vomiting   Lisinopril Other (See Comments)    Unknown   Tylenol [Acetaminophen] Itching   Ibuprofen Itching and Rash   Latex Rash and Other (See Comments)    Usually breaks out the skin   Tape Rash and Other (See Comments)    Usually breaks out the skin      Procedures/Studies: MR PELVIS W WO CONTRAST  Result Date: 09/28/2020 CLINICAL DATA:  Left adnexal mass on recent CT. EXAM: MRI PELVIS WITHOUT AND WITH CONTRAST TECHNIQUE: Multiplanar multisequence MR imaging of the pelvis was performed both before and after administration of intravenous contrast. CONTRAST:  16m GADAVIST GADOBUTROL 1 MMOL/ML IV SOLN COMPARISON:  CTA on 09/29/2018 FINDINGS: Image degradation by motion artifact noted. Lower Urinary Tract: Unremarkable appearance of urinary bladder. Bowel: Severe diverticulosis of sigmoid colon is seen, however there is no evidence of diverticulitis. Vascular/Lymphatic: Unremarkable. No pathologically enlarged pelvic lymph nodes identified. Reproductive: -- Uterus: Surgically absent. Vaginal cuff is unremarkable in appearance. -- Right ovary: Not visualized, however no adnexal mass identified. -- Left ovary: A bilobed solid mass is seen in the left adnexa which measures 9.6 x 5.1 by 6.3 cm. This shows  homogeneous T2 hypointensity, and differential diagnosis includes ovarian fibrothecoma and broad ligament fibroid. Other: Small amount of free fluid is seen in the pelvic cul-de-sac. Musculoskeletal:  Unremarkable. IMPRESSION: 9.6 cm solid mass in the left adnexa, with differential diagnosis of ovarian fibrothecoma and broad ligament fibroid. Small amount of free fluid in pelvic cul-de-sac. Prior hysterectomy. Severe diverticulosis of  sigmoid colon, without evidence of diverticulitis. Electronically Signed   By: Marlaine Hind M.D.   On: 09/28/2020 18:05   CT Angio Aortobifemoral W and/or Wo Contrast  Result Date: 09/28/2020 CLINICAL DATA:  New onset right foot pain traveling up to right knee which began this morning. Unable to palpate pedal pulses. EXAM: CT ANGIOGRAPHY OF ABDOMINAL AORTA WITH ILIOFEMORAL RUNOFF TECHNIQUE: Multidetector CT imaging of the abdomen, pelvis and lower extremities was performed using the standard protocol during bolus administration of intravenous contrast. Multiplanar CT image reconstructions and MIPs were obtained to evaluate the vascular anatomy. CONTRAST:  129m OMNIPAQUE IOHEXOL 350 MG/ML SOLN COMPARISON:  None. FINDINGS: VASCULAR Aorta: Diffuse calcified atheromatous plaque without aneurysm or flow-limiting stenosis. 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 The left gastric, common hepatic and splenic arteries are patent. SMA: 70% stenosis at the origin of the superior mesenteric artery predominately due to noncalcified atheromatous plaque. The artery is patent beyond this region focal stenosis. Renals: Severe stenosis at the origin of the right main renal artery. Mild narrowing at the origin of the left main renal artery. IMA: Severe stenosis of the origin of the inferior mesenteric artery which is otherwise patent. RIGHT Lower Extremity Inflow: Approximately 60% stenosis at the origin of the common iliac  artery due to calcified plaque. Common iliac artery otherwise diffusely calcified but patent. No significant narrowing of the internal iliac artery. Focal stenosis of the mid external iliac artery measuring approximately 50% in its mid segment. Outflow: Evaluation the common femoral artery is limited due to streak artifact from a total hip prosthesis. The common femoral artery appears occluded. There is flow within the Profunda femoris artery likely due to collateral flow. The superficial femoral artery is occluded. There is reconstitution of flow within the popliteal artery on the delayed phase imaging. There is focal stenosis of the distal popliteal artery measuring approximately 70%. Runoff: Tibioperoneal trunk is patent. There is discontinuous opacification of the posterior tibial artery. No significant ossification of the anterior tibial artery. The peroneal artery is patent to the ankle. LEFT Lower Extremity Inflow: Approximately 50% stenosis at the origin of the common iliac artery due to calcified plaque. Common iliac artery is diffusely calcified but otherwise patent. Moderate to severe stenosis at the origin of the internal iliac artery which is otherwise patent. No significant stenosis of the external iliac artery. Outflow: No significant stenosis of the common femoral or Profunda femoris arteries. Focal stenosis of the proximal superficial femoral artery measuring up to 40%. Multiple additional sites of mild stenosis seen in the distal segment of the SFA measuring up to 30%. Left popliteal artery is patent in its proximal and mid segment but occludes in its distal segment. Runoff: The tibioperoneal trunk is occluded. There is faint reconstitution of flow in the anterior tibial artery which is occluded from mid to distal lower leg. Flow reconstitutes within the proximal posterior tibial and peroneal arteries which are patent to the level of the ankle. Veins: No obvious venous abnormality within the  limitations of this arterial phase study. Review of the MIP images confirms the above findings. NON-VASCULAR Lower chest: Mild cardiomegaly.  Mild bibasilar atelectasis. Hepatobiliary: No focal liver abnormality is seen. No gallstones, gallbladder wall thickening, or biliary dilatation. Pancreas: Diffuse fatty atrophy of the pancreas. Spleen: Normal in size without focal abnormality. Adrenals/Urinary Tract: Right adrenal gland is not well visualized. No significant abnormality of the left adrenal gland. Hyperdensity within  the renal collecting systems most likely due to early excretion of contrast rather than medullary nephrocalcinosis. Bladder evaluation limited due to streak artifact from bilateral total hip prostheses. Stomach/Bowel: Diffuse colonic diverticulosis, most extensive in the sigmoid. No evidence of acute diverticulitis. Appendix is not definitively visualized. Large duodenal diverticulum is noted. No small bowel dilatation. Lymphatic: No enlarged abdominal or pelvic lymph nodes. Reproductive: A lobulated mass is noted in the left adnexa measuring 10.2 x 4.8 x 5.9 cm. The exact origin of this mass is difficult to determine, however it is most likely arising from the left ovary. Other: No abdominal wall hernia or abnormality. No abdominopelvic ascites. Musculoskeletal: Bilateral total hip prostheses are present. Degenerative changes are seen throughout the lumbar spine. IMPRESSION: VASCULAR 1. 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 Flow reconstitutes within the popliteal artery. Single-vessel runoff to the right ankle through the peroneal artery. 2. Occluded distal left popliteal artery and tibioperoneal trunk. Two vessel runoff to the left ankle through posterior tibial and peroneal arteries. 3. 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 70% stenosis at the origin of the superior mesenteric artery. High-grade stenosis of the origin of the right renal  artery. NON-VASCULAR 1. 10.2 x 4.8 x 5.9 cm left adnexal mass most likely of ovarian origin. This highly suspicious for malignancy. Gynecological consultation is recommended. Electronically Signed   By: Miachel Roux M.D.   On: 09/28/2020 11:01   DG CHEST PORT 1 VIEW  Result Date: 10/01/2020 CLINICAL DATA:  Pneumonia due to COVID-19, no history of chest pain or hypertension. EXAM: PORTABLE CHEST 1 VIEW COMPARISON:  September 28, 2020. FINDINGS: Heart size remains enlarged.  Hilar structures are stable. Increasing density in the retrocardiac region is noted since the study of September 28, 2020. RIGHT chest is clear. Mild interstitial prominence is unchanged. On limited assessment no acute skeletal process. IMPRESSION: Increasing density in the retrocardiac region could represent volume loss or developing pneumonia. Stable cardiomegaly and mild interstitial prominence, likely interstitial edema. Electronically Signed   By: Zetta Bills M.D.   On: 10/01/2020 13:41   DG CHEST PORT 1 VIEW  Result Date: 09/28/2020 CLINICAL DATA:  Arterial occlusion, lower extremity EXAM: PORTABLE CHEST 1 VIEW COMPARISON:  Radiograph 08/15/2020, chest CT 03/09/2017 FINDINGS: Unchanged cardiomediastinal silhouette with aortic arch calcifications. The heart is mildly enlarged, unchanged. There are mild diffuse interstitial opacities. Old right rib injuries noted. Bilateral shoulder degenerative changes. IMPRESSION: Mild interstitial pulmonary edema.  Unchanged cardiomegaly. Electronically Signed   By: Maurine Simmering M.D.   On: 09/28/2020 16:00      Subjective: Interviewed and examined patient along with her spouse and RN in the room.  Patient reports that she is tired of being in the hospital and wishes to discharge today.  She and her spouse both indicate that she feels much better compared to admission.  Denies dyspnea or chest pain.  Mild intermittent dry cough with some coughing spells.  This also has improved compared to prior.   No pain elsewhere and no other complaints reported.  Patient's spouse jokes that her side of the bed at home has been cold since she has been in the hospital and wants her back home.  Discharge Exam:  Vitals:   10/03/20 2126 10/04/20 0547 10/04/20 0732 10/04/20 0900  BP: 126/81 123/85 (!) 126/58 111/74  Pulse: (!) 101 84 74 74  Resp: '18 18 20   '$ Temp: 98 F (36.7 C) 98 F (36.7 C) 97.8 F (36.6  C)   TempSrc: Oral  Oral   SpO2: 96% (!) 85% 90%   Weight:  75.1 kg    Height:        General appearance: Elderly female, small built, frail sitting up comfortably propped up in bed without distress Resp: Looks much improved.  Occasional left basal crackles but otherwise clear to auscultation.  No wheezing or rhonchi.  Able to speak in full sentences without distress.  No increased work of breathing Cardio: S1-S2 is normal regular.  No S3-S4.  No rubs murmurs or bruit.  No pedal edema. GI: Abdomen is soft.  Nontender nondistended.  Bowel sounds are present normal.  No masses organomegaly Extremities: No edema.  Neurologic: No focal neurological deficits.  Alert and oriented x2.      The results of significant diagnostics from this hospitalization (including imaging, microbiology, ancillary and laboratory) are listed below for reference.     Microbiology: Recent Results (from the past 240 hour(s))  Resp Panel by RT-PCR (Flu A&B, Covid) Nasopharyngeal Swab     Status: Abnormal   Collection Time: 09/28/20  2:23 PM   Specimen: Nasopharyngeal Swab; Nasopharyngeal(NP) swabs in vial transport medium  Result Value Ref Range Status   SARS Coronavirus 2 by RT PCR POSITIVE (A) NEGATIVE Final    Comment: RESULT CALLED TO, READ BACK BY AND VERIFIED WITH: RN CHRISTINA.C AT [id number] ON 09/28/2020 BY PAW. (NOTE) SARS-CoV-2 target nucleic acids are DETECTED.  The SARS-CoV-2 RNA is generally detectable in upper respiratory specimens during the acute phase of infection. Positive results are indicative of  the presence of the identified virus, but do not rule out bacterial infection or co-infection with other pathogens not detected by the test. Clinical correlation with patient history and other diagnostic information is necessary to determine patient infection status. The expected result is Negative.  Fact Sheet for Patients: EntrepreneurPulse.com.au  Fact Sheet for Healthcare Providers: IncredibleEmployment.be  This test is not yet approved or cleared by the [place] FDA and  has been authorized for detection and/or diagnosis of SARS-CoV-2 by FDA under an Emergency Use Authorization (EUA).  This EUA will remain in effect (meaning this  test can be used) for the duration of  the COVID-19 declaration under Section 564(b)(1) of the Act, 21 U.S.C. section 360bbb-3(b)(1), unless the authorization is terminated or revoked sooner.     Influenza A by PCR NEGATIVE NEGATIVE Final   Influenza B by PCR NEGATIVE NEGATIVE Final    Comment: (NOTE) The Xpert Xpress SARS-CoV-2/FLU/RSV plus assay is intended as an aid in the diagnosis of influenza from Nasopharyngeal swab specimens and should not be used as a sole basis for treatment. Nasal washings and aspirates are unacceptable for Xpert Xpress SARS-CoV-2/FLU/RSV testing.  Fact Sheet for Patients: EntrepreneurPulse.com.au  Fact Sheet for Healthcare Providers: IncredibleEmployment.be  This test is not yet approved or cleared by the [place] FDA and has been authorized for detection and/or diagnosis of SARS-CoV-2 by FDA under an Emergency Use Authorization (EUA). This EUA will remain in effect (meaning this test can be used) for the duration of the COVID-19 declaration under Section 564(b)(1) of the Act, 21 U.S.C. section 360bbb-3(b)(1), unless the authorization is terminated or revoked.  Performed at [facility], [place] [address], [place],  [place] [address]      Labs: CBC: Recent Labs  Lab 09/28/20 0731 09/29/20 0155 09/30/20 0132 10/01/20 0056 10/02/20 0200 10/03/20 0150  WBC 8.9 7.7 9.1 7.6 5.5 6.3  NEUTROABS 7.3  --   --   --   --   --  HGB 12.7 13.0 12.0 11.3* 11.2* 11.4*  HCT 41.0 39.9 37.2 35.3* 36.6 35.4*  MCV 103.0* 99.8 98.9 99.4 102.5* 99.7  PLT 225 199 176 148* 174 [id number]    Basic Metabolic Panel: Recent Labs  Lab 09/30/20 0132 10/01/20 0056 10/02/20 0200 10/03/20 0150 10/04/20 0022  NA 138 138 139 141 138  K 3.7 3.1* 4.0 4.0 3.8  CL 105 104 101 102 100  CO2 '25 25 24 30 29  '$ GLUCOSE 132* 116* 162* 144* 151*  BUN '19 18 18 21 21  '$ CREATININE 1.16* 0.93 1.01* 1.04* 1.03*  CALCIUM 8.3* 7.9* 8.4* 8.5* 8.3*    Liver Function Tests: Recent Labs  Lab 09/30/20 0132 10/01/20 0056 10/02/20 0200 10/03/20 0150 10/04/20 0022  AST 56* 52* 56* 55* 52*  ALT 40 45* 49* 60* 63*  ALKPHOS 77 68 68 67 74  BILITOT 0.8 0.7 1.0 0.8 0.9  PROT 5.6* 5.1* 5.7* 5.5* 5.6*  ALBUMIN 2.9* 2.8* 3.0* 2.9* 3.1*    CBG: Recent Labs  Lab 09/30/20 2008  GLUCAP 110*    Hgb A1c No results for input(s): HGBA1C in the last 72 hours.  I discussed in detail with patient spouse at bedside, updated care and answered all questions. I also discussed in detail with patients granddaughter via phone, updated care and answered all questions.   Time coordinating discharge: 25 minutes  SIGNED:  Vernell Leep, MD, [place], [facility]. [facility]  To contact the attending provider between 7A-7P or the covering provider during after hours 7P-7A, please log into the web site [email or web address] and access using universal [place] password for that web site. If you do not have the password, please call the hospital operator.

## 2020-10-04 NOTE — Progress Notes (Signed)
SATURATION QUALIFICATIONS: (This note is used to comply with regulatory documentation for home oxygen)  Patient Saturations on Room Air at Rest = 93%  Patient Saturations on Room Air while Ambulating = 91%  Patient Saturations on 2 Liters of oxygen while Ambulating = 98%  Please briefly explain why patient needs home oxygen:

## 2020-10-24 ENCOUNTER — Ambulatory Visit
Admission: EM | Admit: 2020-10-24 | Discharge: 2020-10-24 | Disposition: A | Discharge status: Home / Self Care | Payer: Medicare Other

## 2020-10-24 DIAGNOSIS — R600 Localized edema: Secondary | ICD-10-CM

## 2020-10-24 NOTE — Discharge Instructions (Addendum)
Please go to the hospital as soon as you leave urgent care.

## 2020-10-24 NOTE — ED Provider Notes (Signed)
[facility]    CSN: [id number] Arrival date & time: 10/24/20  1557      History   Chief Complaint Chief Complaint  Patient presents with   Leg Swelling    HPI Maria [name] is a 87 y.o. female.   Patient presents with bilateral lower extremity edema that started today.  Denies any pain to the extremities.  Denies any chest pain, shortness of breath, headache, dizziness, blurred vision.  Patient does have history of CHF, myocardial infarction, A. fib, DVT.  Patient recently hospitalized in August for A. fib, pneumonia, blood clot.  Patient is a poor historian and is not sure what medications she currently takes or medical history.    Past Medical History:  Diagnosis Date   Allergic urticaria    Anemia    Arthritis    OA AND PAIN LEFT HIP   Blood transfusion without reported diagnosis    Cataract    CHF (congestive heart failure) (HCC)    Chronic diastolic heart failure   Glaucoma    Headache(784.0)    HX OF MIGRAINES   Hypercholesterolemia    Hypertension    Myocardial infarction ([place])    Osteoporosis    Paroxysmal atrial fibrillation (HCC)    Peripheral neuropathy    PONV (postoperative nausea and vomiting)    Rib fractures    IN THE PAST   Shingles 1940'S   Stroke [facility])    TIA (transient ischemic attack)     Patient Active Problem List   Diagnosis Date Noted   Arterial occlusion, lower extremity ([place]) 09/28/2020   Adnexal mass 09/28/2020   Acute respiratory failure with hypoxia ([place]) 08/16/2020   Generalized weakness 08/16/2020   Elevated troponin 08/16/2020   Chronic diastolic CHF (congestive heart failure) ([place]) 08/16/2020   Acute encephalopathy 08/15/2020   Paroxysmal atrial fibrillation ([place]) 01/29/2017   ARF (acute renal failure) ([place]) 11/25/2016   Hypertensive urgency    TIA (transient ischemic attack) 03/12/2015   Dependent edema 03/12/2015   Dysphagia    Dyslipidemia 11/16/2014   Essential hypertension 11/16/2014   Postop  Acute blood loss anemia 06/01/2011   Osteoarthritis of hip 05/27/2011    Past Surgical History:  Procedure Laterality Date   [place] AND CURETTAGE OF UTERUS     MULTIPLE   EXTERNAL FIXATOR LEFT [place] & 2006   BILATERAL CATARACT EXTRACTION   JOINT REPLACEMENT  2011   RIGHT TOTAL HIP REPLACEMENT   TONSILLECTOMY  1934   TOTAL HIP ARTHROPLASTY  05/27/2011   Procedure: TOTAL HIP ARTHROPLASTY;  Surgeon: Gearlean Alf, MD;  Location: [facility];  Service: Orthopedics;  Laterality: Left;    OB History   No obstetric history on file.      Home Medications    Prior to Admission medications   Medication Sig Start Date End Date Taking? Authorizing Provider  amLODipine (NORVASC) 5 MG tablet Take 1 tablet by mouth once daily Patient taking differently: Take 5 mg by mouth daily. 08/19/20   Lorretta Harp, MD  COVID-19 mRNA vaccine, [place], 30 MCG/0.3ML injection USE AS DIRECTED 12/27/19 12/26/20  Carlyle Basques, MD  Cyanocobalamin (VITAMIN B-12 PO) Take 1 tablet by mouth daily with breakfast.     [provider]  ELIQUIS 5 MG TABS tablet Take 1 tablet by mouth twice daily Patient taking differently: Take 5 mg by mouth 2 (two) times  daily. 05/30/20   Lorretta Harp, MD  ezetimibe (ZETIA) 10 MG tablet Take 10 mg by mouth daily. 04/29/19   [provider]  ferrous sulfate 325 (65 FE) MG tablet Take 1 tablet (325 mg total) by mouth 2 (two) times daily after a meal. 07/11/18   Ladene Artist, MD  furosemide (LASIX) 80 MG tablet TAKE 1 TABLET BY MOUTH ONCE DAILY(SCHEDULE APPT FOR FUTURE REFILLS) Patient taking differently: Take 80 mg by mouth daily. 08/19/20   Lorretta Harp, MD  guaiFENesin-dextromethorphan (ROBITUSSIN DM) 100-10 MG/5ML syrup Take 10 mLs by mouth every 4 (four) hours as needed for cough. 10/04/20   Hongalgi, Lenis Dickinson, MD  hydrocortisone cream 1 % Apply 1 application  topically 2 (two) times daily as needed (for itching- apply to affected areas).     [provider]  Multiple Vitamins-Minerals (PRESERVISION AREDS PO) Take 1 tablet by mouth 2 (two) times daily.    [provider]    Family History Family History  Problem Relation Age of Onset   Heart disease Mother    Stroke Mother    Other Father 17       MVA   Stroke Sister    Stroke Brother    Atrial fibrillation Son    Other Son 12       fire   Colon cancer Neg Hx    Esophageal cancer Neg Hx    Rectal cancer Neg Hx    Stomach cancer Neg Hx     Social History Social History   Tobacco Use   Smoking status: Former   Smokeless tobacco: Never   Tobacco comments:    57 YRS AGO  Vaping Use   Vaping Use: Never used  Substance Use Topics   Alcohol use: No   Drug use: No     Allergies   Codeine, Lisinopril, Tylenol [acetaminophen], Ibuprofen, Latex, and Tape   Review of Systems Review of Systems Per HPI  Physical Exam Triage Vital Signs ED Triage Vitals [10/24/20 1704]  Enc Vitals Group     BP 137/84     Pulse Rate 75     Resp 18     Temp 97.6 F (36.4 C)     Temp Source Oral     SpO2 95 %     Weight      Height      Head Circumference      Peak Flow      Pain Score 0     Pain Loc      Pain Edu?      Excl. in [place]?    No data found.  Updated Vital Signs BP 137/84 (BP Location: Left Arm)   Pulse 75   Temp 97.6 F (36.4 C) (Oral)   Resp 18   SpO2 95%   Visual Acuity Right Eye Distance:   Left Eye Distance:   Bilateral Distance:    Right Eye Near:   Left Eye Near:    Bilateral Near:     Physical Exam Constitutional:      Appearance: Normal appearance.  HENT:     Head: Normocephalic and atraumatic.  Eyes:     Extraocular Movements: Extraocular movements intact.     Conjunctiva/sclera: Conjunctivae normal.  Cardiovascular:     Rate and Rhythm: Normal rate and regular rhythm.     Pulses: Normal pulses.     Heart sounds: Normal heart  sounds.  Pulmonary:     Effort: Pulmonary effort is  normal.     Comments: Crackles noted in left lower lung base. Musculoskeletal:        General: Normal range of motion.     Right lower leg: Edema present.     Left lower leg: Edema present.     Comments: Bilateral nonpitting edema present to bilateral lower extremities.  Redness present to right lower extremity.  Neurovascular intact.  Skin:    General: Skin is warm and dry.  Neurological:     General: No focal deficit present.     Mental Status: She is alert and oriented to person, place, and time. Mental status is at baseline.  Psychiatric:        Mood and Affect: Mood normal.        Behavior: Behavior normal.        Thought Content: Thought content normal.        Judgment: Judgment normal.     UC Treatments / Results  Labs (all labs ordered are listed, but only abnormal results are displayed) Labs Reviewed - No data to display  EKG   Radiology No results found.  Procedures Procedures (including critical care time)  Medications Ordered in UC Medications - No data to display  Initial Impression / Assessment and Plan / UC Course  I have reviewed the triage vital signs and the nursing notes.  Pertinent labs & imaging results that were available during my care of the patient were reviewed by me and considered in my medical decision making (see chart for details).     Highly suspicious for acute on chronic heart failure causing patient's symptoms.  Patient has extensive cardiac history and risk factors that warrants further evaluation and management at the hospital.  Advised patient to go to the hospital for further evaluation and management.  Patient was agreeable with plan.  Vital signs stable at discharge.  Agree with husband transporting patient to the hospital. Final Clinical Impressions(s) / UC Diagnoses   Final diagnoses:  Bilateral lower extremity edema     Discharge Instructions      Please go to the  hospital as soon as you leave urgent care.     ED Prescriptions   None    PDMP not reviewed this encounter.   Odis Luster, [place] 10/24/20 1744

## 2020-10-24 NOTE — ED Triage Notes (Signed)
Pt c/o bilateral lower edema onset this afternoon. Pt states "I have heart problems" and this happened before but "not this bad." The onset was stated as this afternoon after lunch. Husband states the pt's legs "did not look like this yesterday." Husband states pt had a blood clot in her leg, thinks it was the left one, but said when it happened she was in pain. Pt denies pain currently.   Pt denies being allergic to tylenol and ibuprofen as seen in allergy list.   Attempt to reconcile medication however pt is unfamiliar with many of her medications.

## 2020-10-30 ENCOUNTER — Telehealth: Payer: Self-pay | Admitting: *Deleted

## 2020-10-30 NOTE — Telephone Encounter (Signed)
Called and left the patient a message to call the office back for an appt

## 2020-11-01 ENCOUNTER — Telehealth: Payer: Self-pay | Admitting: *Deleted

## 2020-11-01 ENCOUNTER — Encounter: Payer: Self-pay | Admitting: *Deleted

## 2020-11-01 NOTE — Telephone Encounter (Signed)
Spoke with the patient and scheduled a new patient appt on 10/3 with Dr Berline Lopes at 11:15 am. Patient given the policy for mask and visitors, along with the address and phone number. Patient requested a letter to be mailed to her with all this information.

## 2020-11-06 ENCOUNTER — Other Ambulatory Visit: Payer: Self-pay

## 2020-11-06 DIAGNOSIS — I70209 Unspecified atherosclerosis of native arteries of extremities, unspecified extremity: Secondary | ICD-10-CM

## 2020-11-11 ENCOUNTER — Telehealth: Payer: Self-pay

## 2020-11-11 ENCOUNTER — Ambulatory Visit: Payer: Medicare Other | Admitting: Gynecologic Oncology

## 2020-11-11 NOTE — Progress Notes (Deleted)
GYNECOLOGIC ONCOLOGY NEW PATIENT CONSULTATION   Patient Name: Maria [name]  Patient Age: 87 y.o. Date of Service: *** Referring Provider: Jani Gravel, MD [address] [place],  [place] [address]   Primary Care Provider: Jani Gravel, MD Consulting Provider: Jeral Pinch, MD   Assessment/Plan:  ***   A copy of this note was sent to the patient's referring provider.   Jeral Pinch, MD  Division of Gynecologic Oncology  Department of Obstetrics and Gynecology  [facility]  ___________________________________________  Chief Complaint: No chief complaint on file.   History of Present Illness:  Maria [name] is a 87 y.o. y.o. female who is seen in consultation at the request of Jani Gravel, MD for an evaluation of ***  PAST MEDICAL HISTORY:  Past Medical History:  Diagnosis Date   Allergic urticaria    Anemia    Arthritis    OA AND PAIN LEFT HIP   Blood transfusion without reported diagnosis    Cataract    CHF (congestive heart failure) ([place])    Chronic diastolic heart failure   Glaucoma    Headache(784.0)    HX OF MIGRAINES   Hypercholesterolemia    Hypertension    Myocardial infarction ([place])    Osteoporosis    Paroxysmal atrial fibrillation ([place])    Peripheral neuropathy    PONV (postoperative nausea and vomiting)    Rib fractures    IN THE PAST   Shingles 1940'S   Stroke ([place])    TIA (transient ischemic attack)      PAST SURGICAL HISTORY:  Past Surgical History:  Procedure Laterality Date   [place] OF UTERUS     MULTIPLE   EXTERNAL FIXATOR LEFT [place] & 2006   BILATERAL CATARACT EXTRACTION   JOINT REPLACEMENT  2011   RIGHT TOTAL HIP REPLACEMENT   TONSILLECTOMY  1934   TOTAL HIP ARTHROPLASTY  05/27/2011   Procedure: TOTAL HIP ARTHROPLASTY;  Surgeon: Gearlean Alf, MD;  Location: [facility];   Service: Orthopedics;  Laterality: Left;    OB/GYN HISTORY:  OB History  No obstetric history on file.    No LMP recorded. Patient has had a hysterectomy.  Age at menarche: ***  Age at menopause: *** Hx of HRT: *** Hx of STDs: *** Last pap: *** History of abnormal pap smears: ***  SCREENING STUDIES:  Last mammogram: ***  Last colonoscopy: *** Last bone mineral density: ***  MEDICATIONS: Outpatient Encounter Medications as of 11/11/2020  Medication Sig   amLODipine (NORVASC) 5 MG tablet Take 1 tablet by mouth once daily (Patient taking differently: Take 5 mg by mouth daily.)   COVID-19 mRNA vaccine, Pfizer, 30 MCG/0.3ML injection USE AS DIRECTED   Cyanocobalamin (VITAMIN B-12 PO) Take 1 tablet by mouth daily with breakfast.    ELIQUIS 5 MG TABS tablet Take 1 tablet by mouth twice daily (Patient taking differently: Take 5 mg by mouth 2 (two) times daily.)   ezetimibe (ZETIA) 10 MG tablet Take 10 mg by mouth daily.   ferrous sulfate 325 (65 FE) MG tablet Take 1 tablet (325 mg total) by mouth 2 (two) times daily after a meal.   furosemide (LASIX) 80 MG tablet TAKE 1 TABLET BY MOUTH ONCE DAILY(SCHEDULE APPT FOR FUTURE REFILLS) (Patient taking differently: Take 80 mg by mouth daily.)   guaiFENesin-dextromethorphan (ROBITUSSIN DM) 100-10 MG/5ML  syrup Take 10 mLs by mouth every 4 (four) hours as needed for cough.   hydrocortisone cream 1 % Apply 1 application topically 2 (two) times daily as needed (for itching- apply to affected areas).    Multiple Vitamins-Minerals (PRESERVISION AREDS PO) Take 1 tablet by mouth 2 (two) times daily.   No facility-administered encounter medications on file as of 11/11/2020.    ALLERGIES:  Allergies  Allergen Reactions   Codeine Nausea And Vomiting   Lisinopril Other (See Comments)    Unknown   Tylenol [Acetaminophen] Itching   Ibuprofen Itching and Rash   Latex Rash and Other (See Comments)    Usually breaks out the skin   Tape Rash and Other  (See Comments)    Usually breaks out the skin     FAMILY HISTORY:  Family History  Problem Relation Age of Onset   Heart disease Mother    Stroke Mother    Other Father 58       MVA   Stroke Sister    Stroke Brother    Atrial fibrillation Son    Other Son 12       fire   Colon cancer Neg Hx    Esophageal cancer Neg Hx    Rectal cancer Neg Hx    Stomach cancer Neg Hx      SOCIAL HISTORY:  Social Connections: Not on file    REVIEW OF SYSTEMS:  Denies appetite changes, fevers, chills, fatigue, unexplained weight changes. Denies hearing loss, neck lumps or masses, mouth sores, ringing in ears or voice changes. Denies cough or wheezing.  Denies shortness of breath. Denies chest pain or palpitations. Denies leg swelling. Denies abdominal distention, pain, blood in stools, constipation, diarrhea, nausea, vomiting, or early satiety. Denies pain with intercourse, dysuria, frequency, hematuria or incontinence. Denies hot flashes, pelvic pain, vaginal bleeding or vaginal discharge.   Denies joint pain, back pain or muscle pain/cramps. Denies itching, rash, or wounds. Denies dizziness, headaches, numbness or seizures. Denies swollen lymph nodes or glands, denies easy bruising or bleeding. Denies anxiety, depression, confusion, or decreased concentration.  Physical Exam:  Vital Signs for this encounter:  There were no vitals taken for this visit. There is no height or weight on file to calculate BMI. General: Alert, oriented, no acute distress.  HEENT: Normocephalic, atraumatic. Sclera anicteric.  Chest: Clear to auscultation bilaterally. No wheezes, rhonchi, or rales. Cardiovascular: Regular rate and rhythm, no murmurs, rubs, or gallops.  Abdomen: ***Obese. Normoactive bowel sounds. Soft, nondistended, nontender to palpation. No masses or hepatosplenomegaly appreciated. No palpable fluid wave.  Extremities: Grossly normal range of motion. Warm, well perfused. No edema  bilaterally.  Skin: No rashes or lesions.  Lymphatics: No cervical, supraclavicular, or inguinal adenopathy.  GU:  Normal external female genitalia. ***  No lesions. No discharge or bleeding.             Bladder/urethra:  No lesions or masses, well supported bladder             Vagina: ***             Cervix: Normal appearing, no lesions.             Uterus: *** Small, mobile, no parametrial involvement or nodularity.             Adnexa: *** masses.  Rectal: ***  LABORATORY AND RADIOLOGIC DATA:  ***Outside medical records were reviewed to synthesize the above history, along with the history and physical obtained during the  visit.   CA-125: 11.6 CEA: 3.2  Lab Results  Component Value Date   WBC 6.3 10/03/2020   HGB 11.4 (L) 10/03/2020   HCT 35.4 (L) 10/03/2020   PLT 191 10/03/2020   GLUCOSE 151 (H) 10/04/2020   CHOL 178 11/07/2016   TRIG 77 11/07/2016   HDL 51 11/07/2016   LDLCALC 112 (H) 11/07/2016   ALT 63 (H) 10/04/2020   AST 52 (H) 10/04/2020   NA 138 10/04/2020   K 3.8 10/04/2020   CL 100 10/04/2020   CREATININE 1.03 (H) 10/04/2020   BUN 21 10/04/2020   CO2 29 10/04/2020   TSH 2.542 08/16/2020   INR 1.7 (H) 08/16/2020   HGBA1C 5.8 (H) 11/07/2016   Mri pelvis 8/20: IMPRESSION: 9.6 cm solid mass in the left adnexa, with differential diagnosis of ovarian fibrothecoma and broad ligament fibroid.   Small amount of free fluid in pelvic cul-de-sac.   Prior hysterectomy.   Severe diverticulosis of sigmoid colon, without evidence of diverticulitis.  CT angio 8/20: IMPRESSION: VASCULAR   1. 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 Flow reconstitutes within the popliteal artery. Single-vessel runoff to the right ankle through the peroneal artery. 2. Occluded distal left popliteal artery and tibioperoneal trunk. Two vessel runoff to the left ankle through posterior tibial and peroneal arteries. 3. Complete to near complete occlusion at the origin  of the celiac artery. 70% stenosis at the origin of the superior mesenteric artery. High-grade stenosis of the origin of the right renal artery.   NON-VASCULAR   1. 10.2 x 4.8 x 5.9 cm left adnexal mass most likely of ovarian origin. This highly suspicious for malignancy. Gynecological consultation is recommended.

## 2020-11-11 NOTE — Telephone Encounter (Signed)
[name] called stating that Ms Wenz has dementia. The family did not know of appointment until this am.  R/s appointment to 11-22-20 at 1030.  If an appointment open for Monday 11-18-20, our scheduler will call dawn on Friday 11-15-20 with new appointment time. [name] and husband leaving for home on 11-22-20 and have an 11 hr drive so earlier appointment would be helpful.

## 2020-11-14 ENCOUNTER — Other Ambulatory Visit: Payer: Self-pay

## 2020-11-14 ENCOUNTER — Encounter: Payer: Self-pay | Admitting: Vascular Surgery

## 2020-11-14 ENCOUNTER — Ambulatory Visit (INDEPENDENT_AMBULATORY_CARE_PROVIDER_SITE_OTHER): Payer: Medicare Other | Admitting: Vascular Surgery

## 2020-11-14 ENCOUNTER — Ambulatory Visit (HOSPITAL_COMMUNITY)
Admission: RE | Admit: 2020-11-14 | Discharge: 2020-11-14 | Disposition: A | Discharge status: Home / Self Care | Payer: Medicare Other | Source: Ambulatory Visit | Attending: Vascular Surgery | Admitting: Vascular Surgery

## 2020-11-14 VITALS — BP 120/64 | HR 85 | Temp 97.9°F | Resp 20 | Ht 63.0 in | Wt 165.0 lb

## 2020-11-14 DIAGNOSIS — I70209 Unspecified atherosclerosis of native arteries of extremities, unspecified extremity: Secondary | ICD-10-CM | POA: Diagnosis not present

## 2020-11-14 DIAGNOSIS — I739 Peripheral vascular disease, unspecified: Secondary | ICD-10-CM | POA: Diagnosis not present

## 2020-11-14 NOTE — Progress Notes (Signed)
Patient name: RASHA IBE MRN: [id number] DOB: [birth year] Sex: female  REASON FOR VISIT:   Follow-up of peripheral arterial disease.  HPI:   SHEFALI NG is a pleasant 87 y.o. female who I saw in consultation in the hospital on 09/28/2020 with right leg pain.  She had the fairly acute onset of right foot pain.  The pain persisted and she presented to the emergency department.  By the time I had seen her her pain had resolved.  She has a history of atrial fibrillation but was on Eliquis and had taken this right up until the day of admission.  Her CT scan showed multilevel arterial occlusive disease but no occlusion in the right common femoral artery or superficial femoral artery.  It looks like her superficial femoral artery was likely chronically occluded.  She was placed on IV heparin.  She remained asymptomatic and ultimately was discharged.  I arrange for a follow-up visit in 6 weeks.  Since I saw her in the hospital, she denies any claudication, rest pain, or nonhealing ulcers.  She is back to her normal activities.  She is not a smoker.  She remains on Eliquis for atrial fibrillation.  Current Outpatient Medications  Medication Sig Dispense Refill   amLODipine (NORVASC) 5 MG tablet Take 1 tablet by mouth once daily (Patient taking differently: Take 5 mg by mouth daily.) 90 tablet 0   COVID-19 mRNA vaccine, Pfizer, 30 MCG/0.3ML injection USE AS DIRECTED .3 mL 0   Cyanocobalamin (VITAMIN B-12 PO) Take 1 tablet by mouth daily with breakfast.      ELIQUIS 5 MG TABS tablet Take 1 tablet by mouth twice daily (Patient taking differently: Take 5 mg by mouth 2 (two) times daily.) 180 tablet 1   ezetimibe (ZETIA) 10 MG tablet Take 10 mg by mouth daily.     furosemide (LASIX) 80 MG tablet TAKE 1 TABLET BY MOUTH ONCE DAILY(SCHEDULE APPT FOR FUTURE REFILLS) (Patient taking differently: Take 80 mg by mouth daily.) 90 tablet 0   hydrocortisone cream 1 % Apply 1 application topically 2  (two) times daily as needed (for itching- apply to affected areas).      Multiple Vitamins-Minerals (PRESERVISION AREDS PO) Take 1 tablet by mouth 2 (two) times daily.     No current facility-administered medications for this visit.    REVIEW OF SYSTEMS:  [X]  denotes positive finding, [ ]  denotes negative finding Vascular    Leg swelling    Cardiac    Chest pain or chest pressure:    Shortness of breath upon exertion:    Short of breath when lying flat:    Irregular heart rhythm:    Constitutional    Fever or chills:     PHYSICAL EXAM:   Vitals:   11/14/20 1329  BP: 120/64  Pulse: 85  Resp: 20  Temp: 97.9 F (36.6 C)  SpO2: 94%  Weight: 165 lb (74.8 kg)  Height: 5\' 3"  (1.6 m)    GENERAL: The patient is a well-nourished female, in no acute distress. The vital signs are documented above. CARDIOVASCULAR: There is a regular rate and rhythm. PULMONARY: There is good air exchange bilaterally without wheezing or rales. VASCULAR: She has diminished but palpable femoral pulses. Both feet are warm and well-perfused.  DATA:   ARTERIAL DOPPLER STUDY: I have independently interpreted her arterial Doppler study today.  On the right side there is a monophasic dorsalis pedis and posterior tibial signal.  ABI is 91% although this is  likely falsely elevated.  Toe pressures 85 mmHg.  On the left side there is a biphasic posterior tibial signal and a monophasic dorsalis pedis signal.  ABIs 100%.  Toe pressure 64 mmHg.  MEDICAL ISSUES:   MULTILEVEL ARTERIAL OCCLUSIVE DISEASE: This patient has evidence of multilevel arterial occlusive disease however she is asymptomatic.  She has good Doppler signals in both feet and reasonable toe pressures.  I do not think any further work-up is indicated.  She is 87 years old and clearly we want to avoid an aggressive approach to her vascular disease unless she developed critical limb ischemia.  She is in the process of moving and I do not think she  necessarily needs a vascular surgeon unless she develops new vascular issues.  I will be happy to see her back at any time if any new vascular issues arise.  Deitra Mayo [facility] [phone]

## 2020-11-16 ENCOUNTER — Other Ambulatory Visit: Payer: Self-pay | Admitting: Cardiovascular Disease

## 2020-11-22 ENCOUNTER — Inpatient Hospital Stay: Payer: Medicare Other | Attending: Gynecologic Oncology | Admitting: Gynecologic Oncology

## 2020-11-22 ENCOUNTER — Encounter: Payer: Self-pay | Admitting: Gynecologic Oncology

## 2020-11-22 ENCOUNTER — Other Ambulatory Visit: Payer: Self-pay

## 2020-11-22 VITALS — BP 114/58 | HR 75 | Temp 98.0°F | Resp 16 | Ht 63.0 in | Wt 161.2 lb

## 2020-11-22 DIAGNOSIS — Z90710 Acquired absence of both cervix and uterus: Secondary | ICD-10-CM | POA: Diagnosis not present

## 2020-11-22 DIAGNOSIS — I252 Old myocardial infarction: Secondary | ICD-10-CM | POA: Insufficient documentation

## 2020-11-22 DIAGNOSIS — I110 Hypertensive heart disease with heart failure: Secondary | ICD-10-CM | POA: Insufficient documentation

## 2020-11-22 DIAGNOSIS — R1900 Intra-abdominal and pelvic swelling, mass and lump, unspecified site: Secondary | ICD-10-CM | POA: Diagnosis not present

## 2020-11-22 DIAGNOSIS — K5730 Diverticulosis of large intestine without perforation or abscess without bleeding: Secondary | ICD-10-CM | POA: Diagnosis not present

## 2020-11-22 DIAGNOSIS — I480 Paroxysmal atrial fibrillation: Secondary | ICD-10-CM | POA: Insufficient documentation

## 2020-11-22 DIAGNOSIS — Z8673 Personal history of transient ischemic attack (TIA), and cerebral infarction without residual deficits: Secondary | ICD-10-CM | POA: Insufficient documentation

## 2020-11-22 DIAGNOSIS — I82401 Acute embolism and thrombosis of unspecified deep veins of right lower extremity: Secondary | ICD-10-CM | POA: Diagnosis not present

## 2020-11-22 DIAGNOSIS — E7800 Pure hypercholesterolemia, unspecified: Secondary | ICD-10-CM | POA: Insufficient documentation

## 2020-11-22 DIAGNOSIS — Z87891 Personal history of nicotine dependence: Secondary | ICD-10-CM | POA: Diagnosis not present

## 2020-11-22 DIAGNOSIS — Z79899 Other long term (current) drug therapy: Secondary | ICD-10-CM | POA: Diagnosis not present

## 2020-11-22 DIAGNOSIS — Z8616 Personal history of COVID-19: Secondary | ICD-10-CM | POA: Diagnosis not present

## 2020-11-22 DIAGNOSIS — Z7901 Long term (current) use of anticoagulants: Secondary | ICD-10-CM | POA: Insufficient documentation

## 2020-11-22 DIAGNOSIS — N9489 Other specified conditions associated with female genital organs and menstrual cycle: Secondary | ICD-10-CM

## 2020-11-22 NOTE — Progress Notes (Signed)
GYNECOLOGIC ONCOLOGY NEW PATIENT CONSULTATION   Patient Name: Maria [name]  Patient Age: 87 y.o. Date of Service: 11/22/20 Referring Provider: Jani Gravel, MD [address],  [place] [address]   Primary Care Provider: Merrilee Seashore, MD Consulting Provider: Jeral Pinch, MD   Assessment/Plan:  Postmenopausal patient with an asymptomatic solid mass, suspected to be benign.  Reviewed imaging findings with the patient and her daughter and showed them MRI images. Based on MRI findings, I favor that this is a benign mass, either coming from her ovary or from the tissue adjacent to her ovary. Patient is completely asymptomatic. She was hospitalized recently for a DVT in the setting of Covid. DVT was on the right, and thus unlikely to be related to this mass. Both MRI as well as CT scan show no evidence of metastatic disease.  In terms of treatment options, we discussed three. The patient and her daughter understand that no definitive diagnosis can be made without histologic examination. One option would be to proceed with surgery with excision of the mass for pathology review. Patient has significant medical comorbidities that put her at increased risk with regard to surgery. She is not interested in having surgery. The second option would be to defer any additional imaging or work up unless the patient were to become symptomatic. The third option would be to repeat imaging, likely an MRI, in 4 to 6 months. If mass appears stable in size and similar in character, I think further imaging could be deferred unless the patient were to become symptomatic. Even then the setting of malignancy, which I think again is unlikely, the patient voiced no interest and surgery and would not be interested in other therapy such as chemotherapy.  Giving upcoming move to [place] in the next couple of months, the patient was given a printed copy of her CT scan and MRI to take with her for  her records. We discussed that the family could get a CD burned by going to medical records if they want to take the images with them. She will establish with a primary care provider there and I think it would be very reasonable for her PCP to obtain repeat imaging if she decides that she wants to repeat an MRI in 4-6 months..  A copy of this note was sent to the patient's referring provider.   60 minutes of total time was spent for this patient encounter, including preparation, face-to-face counseling with the patient and coordination of care, and documentation of the encounter.   Jeral Pinch, MD  Division of Gynecologic Oncology  Department of Obstetrics and Gynecology  [facility]  ___________________________________________  Chief Complaint: No chief complaint on file.   History of Present Illness:  Maria [name] is a 87 y.o. y.o. female who is seen in consultation at the request of Jani Gravel, MD for an evaluation of a solid adnexal mass.  Patient presents today with her daughter-in-law.  She was recently hospitalized in August when she presented with right leg pain.  CT angiography showed multiple occlusions.  Patient was started on heparin with discharge back on Eliquis (was on the secondary to history of A. Fib).  Vascular surgery recommended against thrombectomy.  During that hospitalization, patient was treated for pneumonia secondary to COVID-19.  On CT angiogram, a left adnexal mass was noted.  MRI was obtained for further characterization that was most consistent with a benign lesion, either ovarian fibroma versus broad ligament fibroid.  Patient was seen by Dr. Delsa Sale during her hospitalization with plan for outpatient follow-up.  Tumor markers obtained while inpatient were normal including CA-125 (11.6) and CEA (3.2).  She notes doing well since that hospitalization.  She continues on Eliquis.  She denies any right leg pain or  swelling.  She endorses a good appetite without nausea or emesis.  She reports regular bowel and bladder function.  She has some increased urinary frequency but this has been true since she was started on Lasix.  She denies any recent weight changes.  She denies any abdominal or pelvic pain.  Her GYN history is notable for a hysterectomy although the patient does not remember having this and when we discussed this surgery, she cannot remember why she would have had it.  She denies any history of abnormal Paps on though has not had one in a long time.  Patient lives in [place] with her husband.  She has a remote history of tobacco use but quit almost 50 years ago.  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  PAST MEDICAL HISTORY:  Past Medical History:  Diagnosis Date   Allergic urticaria    Anemia    Arthritis    OA AND PAIN LEFT HIP   Blood transfusion without reported diagnosis    Cataract    CHF (congestive heart failure) (HCC)    Chronic diastolic heart failure   Glaucoma    Headache(784.0)    HX OF MIGRAINES   Hypercholesterolemia    Hypertension    Myocardial infarction ([facility])    Osteoporosis    Paroxysmal atrial fibrillation (HCC)    Peripheral neuropathy    PONV (postoperative nausea and vomiting)    Rib fractures    IN THE PAST   Shingles 1940'S   Stroke ([facility])    TIA (transient ischemic attack)      PAST SURGICAL HISTORY:  Past Surgical History:  Procedure Laterality Date   [facility] OF UTERUS     MULTIPLE   EXTERNAL FIXATOR LEFT [place] & 2006   BILATERAL CATARACT EXTRACTION   JOINT REPLACEMENT  2011   RIGHT TOTAL HIP REPLACEMENT   TONSILLECTOMY  1934   TOTAL HIP ARTHROPLASTY  05/27/2011   Procedure: TOTAL HIP ARTHROPLASTY;  Surgeon: Gearlean Alf, MD;  Location: [facility];   Service: Orthopedics;  Laterality: Left;    OB/GYN HISTORY:  OB History  Gravida Para Term Preterm AB Living  2 2          SAB IAB Ectopic Multiple Live Births               # Outcome Date GA Lbr Len/2nd Weight Sex Delivery Anes PTL Lv  2 Para           1 Para             No LMP recorded. Patient has had a hysterectomy.  Age at menarche: 17 Age at menopause: Unsure Hx of HRT: Denies Hx of STDs: Denies Last pap: Years ago History of abnormal pap smears: Denies  SCREENING STUDIES:  Last mammogram: Years ago  Last colonoscopy: 2020  MEDICATIONS: Outpatient Encounter Medications as of 11/22/2020  Medication Sig   amLODipine (NORVASC) 5 MG tablet Take 1 tablet by mouth once daily   COVID-19 mRNA  vaccine, Pfizer, 30 MCG/0.3ML injection USE AS DIRECTED   ELIQUIS 5 MG TABS tablet Take 1 tablet by mouth twice daily (Patient taking differently: Take 5 mg by mouth 2 (two) times daily.)   ezetimibe (ZETIA) 10 MG tablet Take 10 mg by mouth daily.   furosemide (LASIX) 80 MG tablet Take 1 tablet (80 mg total) by mouth daily.   hydrocortisone cream 1 % Apply 1 application topically 2 (two) times daily as needed (for itching- apply to affected areas).    Multiple Vitamins-Minerals (PRESERVISION AREDS PO) Take 1 tablet by mouth 2 (two) times daily.   Cyanocobalamin (VITAMIN B-12 PO) Take 1 tablet by mouth daily with breakfast.  (Patient not taking: Reported on 11/22/2020)   No facility-administered encounter medications on file as of 11/22/2020.    ALLERGIES:  Allergies  Allergen Reactions   Codeine Nausea And Vomiting   Lisinopril Other (See Comments)    Unknown   Tylenol [Acetaminophen] Itching   Ibuprofen Itching and Rash   Latex Rash and Other (See Comments)    Usually breaks out the skin   Tape Rash and Other (See Comments)    Usually breaks out the skin     FAMILY HISTORY:  Family History  Problem Relation Age of Onset   Heart disease Mother    Stroke Mother     Other Father 15       MVA   Stroke Sister    Stroke Brother    Atrial fibrillation Son    Other Son 12       fire   Colon cancer Neg Hx    Esophageal cancer Neg Hx    Rectal cancer Neg Hx    Stomach cancer Neg Hx      SOCIAL HISTORY:  Social Connections: Not on file    REVIEW OF SYSTEMS:  Pertinent positives include hearing loss, leg swelling, palpitations, diarrhea, rash, itching, difficulty walking, bruising/bleeding easily, confusion, decreased concentration, incontinence. Denies appetite changes, fevers, chills, fatigue, unexplained weight changes. Denies neck lumps or masses, mouth sores, ringing in ears or voice changes. Denies cough or wheezing.  Denies shortness of breath. Denies chest pain.  Denies abdominal distention, pain, blood in stools, constipation, nausea, vomiting, or early satiety. Denies pain with intercourse, dysuria, frequency, hematuria. Denies hot flashes, pelvic pain, vaginal bleeding or vaginal discharge.   Denies joint pain, back pain or muscle pain/cramps. Denies wounds. Denies dizziness, headaches, numbness or seizures. Denies swollen lymph nodes or glands,. Denies anxiety, depression.  Physical Exam:  Vital Signs for this encounter:  Blood pressure (!) 114/58, pulse 75, temperature 98 F (36.7 C), temperature source Oral, resp. rate 16, height 5\' 3"  (1.6 m), weight 161 lb 3.2 oz (73.1 kg), SpO2 97 %. Body mass index is 28.56 kg/m. General: Alert, oriented, no acute distress.  HEENT: Normocephalic, atraumatic. Sclera anicteric.  Chest: Unlabored breathing on room air. Cardiovascular: Regular rate and rhythm, no murmurs, rubs, or gallops.   LABORATORY AND RADIOLOGIC DATA:  Outside medical records were reviewed to synthesize the above history, along with the history and physical obtained during the visit.   Lab Results  Component Value Date   WBC 6.3 10/03/2020   HGB 11.4 (L) 10/03/2020   HCT 35.4 (L) 10/03/2020   PLT 191 10/03/2020    GLUCOSE 151 (H) 10/04/2020   CHOL 178 11/07/2016   TRIG 77 11/07/2016   HDL 51 11/07/2016   LDLCALC 112 (H) 11/07/2016   ALT 63 (H) 10/04/2020   AST 52 (H)  10/04/2020   NA 138 10/04/2020   K 3.8 10/04/2020   CL 100 10/04/2020   CREATININE 1.03 (H) 10/04/2020   BUN 21 10/04/2020   CO2 29 10/04/2020   TSH 2.542 08/16/2020   INR 1.7 (H) 08/16/2020   HGBA1C 5.8 (H) 11/07/2016   MRI pelvic 8/20: IMPRESSION: 9.6 cm solid mass in the left adnexa, with differential diagnosis of ovarian fibrothecoma and broad ligament fibroid.   Small amount of free fluid in pelvic cul-de-sac.   Prior hysterectomy.   Severe diverticulosis of sigmoid colon, without evidence of diverticulitis.  CT angiogram (gyn findings) 8/20: 1. 10.2 x 4.8 x 5.9 cm left adnexal mass most likely of ovarian origin. This highly suspicious for malignancy. Gynecological consultation is recommended.

## 2020-11-22 NOTE — Patient Instructions (Signed)
It was a pleasure meeting you today!  As we discussed, based on the CT and MRI that you had while in the hospital in August, there is about a 10 cm solid mass that looks like it is coming from your left ovary or the tissue right next to the ovary.  Overall, based on the MRI, the features point to a benign mass.  Without biopsy of this mass or removal for examination under the microscope, though, it is impossible to say with certainty that this is not a cancer.  We discussed 3 possible treatment options.  The first would be to proceed with consideration of surgery.  Given your age and comorbidities, this would not be without significant risk.  Based on imaging findings, I do not recommend this approach.  The second would be to repeat imaging, likely with an MRI, approximately 4-6 months after your imaging in August.  If the mass appears similar in size and character, then I think no further imaging is needed unless you have a change in your symptoms.  The third option would be to not repeat any imaging at this time and only consider in the future for change of symptoms, such as pain.  We also discussed that in the event that this were a cancer, you would not be interested in large surgery or treatment like chemotherapy.  This is further reason to consider either no further imaging or 1 additional imaging to assure no significant change in the size or way that the mass looks.  We have printed out a copy of your CT and MRI for you to have her records after you moved to [place].  I think it would be very reasonable for your primary care provider there to order repeat imaging if you would like to go this route.  If there has been a significant change, then you could be referred again to a gynecologist or GYN oncologist.  If no significant change, I think no additional imaging needs to be ordered as we had discussed.

## 2021-02-14 ENCOUNTER — Other Ambulatory Visit: Payer: Self-pay | Admitting: Cardiovascular Disease

## 2021-02-14 NOTE — Telephone Encounter (Signed)
Prescription refill request for Eliquis received. Indication:Afib Last office visit:3/22 Scr:1.0 Age: 88 Weight:73.1 kg  Prescription refilled

## 2023-11-10 DEATH — deceased
